# Patient Record
Sex: Female | Born: 1975
Health system: Southern US, Community
[De-identification: ages and names within clinical notes are randomized; demographics above are authoritative.]

## PROBLEM LIST (undated history)

## (undated) DIAGNOSIS — D61818 Other pancytopenia: Secondary | ICD-10-CM

## (undated) DIAGNOSIS — Z9221 Personal history of antineoplastic chemotherapy: Secondary | ICD-10-CM

## (undated) DIAGNOSIS — C538 Malignant neoplasm of overlapping sites of cervix uteri: Secondary | ICD-10-CM

## (undated) DIAGNOSIS — R197 Diarrhea, unspecified: Secondary | ICD-10-CM

## (undated) DIAGNOSIS — G43909 Migraine, unspecified, not intractable, without status migrainosus: Secondary | ICD-10-CM

## (undated) DIAGNOSIS — T451X5A Adverse effect of antineoplastic and immunosuppressive drugs, initial encounter: Secondary | ICD-10-CM

## (undated) DIAGNOSIS — D689 Coagulation defect, unspecified: Secondary | ICD-10-CM

## (undated) DIAGNOSIS — M792 Neuralgia and neuritis, unspecified: Secondary | ICD-10-CM

## (undated) DIAGNOSIS — F418 Other specified anxiety disorders: Secondary | ICD-10-CM

## (undated) DIAGNOSIS — R112 Nausea with vomiting, unspecified: Secondary | ICD-10-CM

## (undated) DIAGNOSIS — D649 Anemia, unspecified: Secondary | ICD-10-CM

## (undated) DIAGNOSIS — R1900 Intra-abdominal and pelvic swelling, mass and lump, unspecified site: Secondary | ICD-10-CM

## (undated) DIAGNOSIS — Z5189 Encounter for other specified aftercare: Secondary | ICD-10-CM

## (undated) DIAGNOSIS — Z923 Personal history of irradiation: Secondary | ICD-10-CM

## (undated) HISTORY — DX: Coagulation defect, unspecified: D68.9

## (undated) HISTORY — DX: Encounter for other specified aftercare: Z51.89

## (undated) HISTORY — PX: WISDOM TOOTH EXTRACTION: SHX21

## (undated) HISTORY — DX: Migraine, unspecified, not intractable, without status migrainosus: G43.909

## (undated) HISTORY — PX: ABDOMINAL HYSTERECTOMY: SHX81

---
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
## 2018-03-20 ENCOUNTER — Ambulatory Visit
Admission: EM | Admit: 2018-03-20 | Discharge: 2018-03-20 | Disposition: A | Discharge status: Home / Self Care | Payer: Managed Care, Other (non HMO) | Attending: Family Medicine | Admitting: Family Medicine

## 2018-03-20 DIAGNOSIS — R6889 Other general symptoms and signs: Secondary | ICD-10-CM

## 2018-03-20 DIAGNOSIS — Z3202 Encounter for pregnancy test, result negative: Secondary | ICD-10-CM

## 2018-03-20 DIAGNOSIS — R8290 Unspecified abnormal findings in urine: Secondary | ICD-10-CM | POA: Diagnosis not present

## 2018-03-20 DIAGNOSIS — Z8269 Family history of other diseases of the musculoskeletal system and connective tissue: Secondary | ICD-10-CM | POA: Diagnosis not present

## 2018-03-20 LAB — POCT URINALYSIS DIP (MANUAL ENTRY)
Bilirubin, UA: NEGATIVE
Glucose, UA: NEGATIVE mg/dL
Ketones, POC UA: NEGATIVE mg/dL
Nitrite, UA: NEGATIVE
PH UA: 7.5 (ref 5.0–8.0)
PROTEIN UA: NEGATIVE mg/dL
Spec Grav, UA: <=1.005 — AB (ref 1.010–1.025)
Urobilinogen, UA: 0.2 E.U./dL

## 2018-03-20 LAB — POCT URINE PREGNANCY: Preg Test, Ur: NEGATIVE

## 2018-03-20 NOTE — ED Triage Notes (Signed)
Pt c/o neck, ear, eye and neck pain since January. States has been on 3 rounds of antibiotics, seen ENT and multiple pcp's with no help.

## 2018-03-20 NOTE — Discharge Instructions (Signed)
This is no evidence of infection.  We will call with the lab results.  I am unsure of the culprit of your symptoms.  Consider seeing a neurologist (if possible since you do not yet have a PCP). I recommend [facility].  Take care  Dr. Lacinda Axon

## 2018-03-20 NOTE — ED Provider Notes (Addendum)
[facility]  CSN: [id number] Arrival date & time: 03/20/18  1135  History   Chief Complaint Chief Complaint  Patient presents with  . Facial Pain   HPI  43 year old female presents with multiple complaints.  Patient reports that she has not felt well since January.  Patient reports intermittent fever, chills, night sweats.  She reports ear pain and pressure.  She also reports neck pain, cervical lymphadenopathy, facial pain and pressure, "brain fog".  Patient states that she feels like she is "misfiring".  Patient states that she is also had some trouble ambulating.  Patient has seen a local urgent care provider on more than one occasion.  She has been prescribed antibiotics 3 times.  She has had 2 courses of amoxicillin and a course of doxycycline.  Patient has now seen ENT as of 2/19.  Per her report, he did not feel that this was ENT related.  He thought that this may be neurological.  Patient states that she continues to feel poorly.  She is concerned about her myriad of symptoms.  Of note, patient also reports pain on the top of her head.  I have no record that she has a documented fever.  She is afebrile here.  No known exacerbating factors.  Patient is very concerned and tearful throughout history.  Also, patient states that she has been having irregular periods.  Patient also states that since her antibiotic courses, she has "jellyfish" in her urine.  PMH, Surgical Hx, Family Hx, Social History reviewed and updated as below.  PMH: No reported significant PMH.  Surgical Hx: Recent wisdom teeth extraction.   OB History   No obstetric history on file.    Family History MS - Mother and Brother  Social History Social History   Tobacco Use  . Smoking status: Never Smoker  . Smokeless tobacco: Never Used  Substance Use Topics  . Alcohol use: Not Currently  . Drug use: Not on file     Allergies   Patient has no known allergies.   Review of Systems Review  of Systems Per HPI  Physical Exam Triage Vital Signs ED Triage Vitals  Enc Vitals Group     BP      Pulse      Resp      Temp      Temp src      SpO2      Weight      Height      Head Circumference      Peak Flow      Pain Score      Pain Loc      Pain Edu?      Excl. in [place]?    Updated Vital Signs BP 119/67 (BP Location: Left Arm)   Pulse 76   Temp 97.8 F (36.6 C) (Oral)   SpO2 98%   Visual Acuity Right Eye Distance:   Left Eye Distance:   Bilateral Distance:    Right Eye Near:   Left Eye Near:    Bilateral Near:     Physical Exam Vitals signs and nursing note reviewed.  Constitutional:      General: She is not in acute distress.    Appearance: Normal appearance.  HENT:     Head: Normocephalic and atraumatic.     Right Ear: Tympanic membrane normal.     Left Ear: Tympanic membrane normal.     Nose: Nose normal.     Mouth/Throat:  Pharynx: Oropharynx is clear. No oropharyngeal exudate or posterior oropharyngeal erythema.  Eyes:     General:        Right eye: No discharge.        Left eye: No discharge.     Conjunctiva/sclera: Conjunctivae normal.  Neck:     Musculoskeletal: Neck supple.  Cardiovascular:     Rate and Rhythm: Normal rate and regular rhythm.  Pulmonary:     Effort: Pulmonary effort is normal.     Breath sounds: Normal breath sounds.  Lymphadenopathy:     Cervical: No cervical adenopathy.  Neurological:     Mental Status: She is alert.  Psychiatric:     Comments: Anxious, perseverative, difficult to follow (tangential).     UC Treatments / Results  Labs (all labs ordered are listed, but only abnormal results are displayed) Labs Reviewed  POCT URINALYSIS DIP (MANUAL ENTRY) - Abnormal; Notable for the following components:      Result Value   Spec Grav, UA <=1.005 (*)    Blood, UA moderate (*)    Leukocytes, UA Trace (*)    All other components within normal limits  POCT URINE PREGNANCY - Normal  COMPREHENSIVE METABOLIC  PANEL  CBC WITH DIFFERENTIAL/PLATELET    EKG None  Radiology No results found.  Procedures Procedures (including critical care time)  Medications Ordered in UC Medications - No data to display  Initial Impression / Assessment and Plan / UC Course  I have reviewed the triage vital signs and the nursing notes.  Pertinent labs & imaging results that were available during my care of the patient were reviewed by me and considered in my medical decision making (see chart for details).    43 year old female presents with multiple somatic complaints.  There is no evidence of infection on exam.  Patient has been seen at urgent care and has recently seen ear nose and throat.  I suspect that this is psychogenic and somatization.  ENT has recommended seeing neurology and I think this would be beneficial as she has a family history of multiple sclerosis.  Laboratory studies obtained today.  Awaiting results.  Final Clinical Impressions(s) / UC Diagnoses   Final diagnoses:  Multiple somatic complaints     Discharge Instructions     This is no evidence of infection.  We will call with the lab results.  I am unsure of the culprit of your symptoms.  Consider seeing a neurologist (if possible since you do not yet have a PCP). I recommend [facility].  Take care  Dr. Lacinda Axon     ED Prescriptions    None     Controlled Substance Prescriptions [place] Controlled Substance Registry consulted? Not Applicable   Coral Spikes, DO 03/20/18 [place], [place], DO 03/20/18 1252

## 2018-03-21 LAB — CBC WITH DIFFERENTIAL/PLATELET
Basophils Absolute: 0.1 10*3/uL (ref 0.0–0.2)
Basos: 1 %
EOS (ABSOLUTE): 0.1 10*3/uL (ref 0.0–0.4)
Eos: 1 %
Hematocrit: 37.1 % (ref 34.0–46.6)
Hemoglobin: 13.2 g/dL (ref 11.1–15.9)
IMMATURE GRANS (ABS): 0 10*3/uL (ref 0.0–0.1)
Immature Granulocytes: 0 %
LYMPHS: 34 %
Lymphocytes Absolute: 2.4 10*3/uL (ref 0.7–3.1)
MCH: 33.2 pg — ABNORMAL HIGH (ref 26.6–33.0)
MCHC: 35.6 g/dL (ref 31.5–35.7)
MCV: 93 fL (ref 79–97)
Monocytes Absolute: 0.6 10*3/uL (ref 0.1–0.9)
Monocytes: 8 %
NEUTROS PCT: 56 %
Neutrophils Absolute: 4 10*3/uL (ref 1.4–7.0)
Platelets: 300 10*3/uL (ref 150–450)
RBC: 3.98 x10E6/uL (ref 3.77–5.28)
RDW: 11.3 % — ABNORMAL LOW (ref 11.7–15.4)
WBC: 7.1 10*3/uL (ref 3.4–10.8)

## 2018-03-21 LAB — COMPREHENSIVE METABOLIC PANEL
ALT: 5 IU/L (ref 0–32)
AST: 11 IU/L (ref 0–40)
Albumin/Globulin Ratio: 2 (ref 1.2–2.2)
Albumin: 4.7 g/dL (ref 3.8–4.8)
Alkaline Phosphatase: 54 IU/L (ref 39–117)
BUN/Creatinine Ratio: 10 (ref 9–23)
BUN: 7 mg/dL (ref 6–24)
Bilirubin Total: 0.4 mg/dL (ref 0.0–1.2)
CO2: 24 mmol/L (ref 20–29)
Calcium: 9.9 mg/dL (ref 8.7–10.2)
Chloride: 101 mmol/L (ref 96–106)
Creatinine, Ser: 0.71 mg/dL (ref 0.57–1.00)
GFR calc Af Amer: 121 mL/min/{1.73_m2} (ref >59)
GFR calc non Af Amer: 105 mL/min/{1.73_m2} (ref >59)
Globulin, Total: 2.3 g/dL (ref 1.5–4.5)
Glucose: 82 mg/dL (ref 65–99)
Potassium: 4.5 mmol/L (ref 3.5–5.2)
Sodium: 140 mmol/L (ref 134–144)
Total Protein: 7 g/dL (ref 6.0–8.5)

## 2018-03-23 ENCOUNTER — Telehealth (HOSPITAL_COMMUNITY): Payer: Self-pay | Admitting: Emergency Medicine

## 2018-03-23 NOTE — Telephone Encounter (Signed)
Normal labs. Attempted to reach patient. No answer at this time. 

## 2018-04-01 ENCOUNTER — Ambulatory Visit: Payer: Self-pay | Admitting: Family Medicine

## 2018-04-01 NOTE — Progress Notes (Signed)
Subjective:    Patient ID: Jordan Waters, female    DOB: [birth year], 43 y.o.   MRN: [id number]  HPI:  Jordan Waters is here to establish as a new pt.  She is a pleasant 43 year old female. PMH: Migraine  She denies any other medical conditions/complaints until Jan 2020- then developed significant pain from head to lower back She has experienced spike in fatigue and inability to practice yoga She reports decline in vision and difficulty ambulating She reports her mother (in her 3s) and her brother (in his 36s) bother has MS She has appt with [facility] tomorrow- great! She has been alternating OTC Acetaminophen and Ibuprofen with minimal pain relief Pain - Head: constant throb 9/10 Cervical Neck: constant electric throb 9/10 Thoracic- Lumbar Back: constant "hot posts throbbing" 9/10 She also reports numbness/tingling in all extremities 02/2018- CMP and CBC both stable She denies first degree family hx of diabetes, ca, any other neurological disorders She is accompanied by husband today She states recent depression sx's r/t to constant pain and "just not feeling like myself" She denies any acute accident/injury prior to onset of sx's She denies travel outside [place] or known exposure to TB  Of Note-  Recent ED Visit 03/20/2018 "43 year old female presents with multiple somatic complaints.  There is no evidence of infection on exam.  Patient has been seen at urgent care and has recently seen ear nose and throat.  I suspect that this is psychogenic and somatization.  ENT has recommended seeing neurology and I think this would be beneficial as she has a family history of multiple sclerosis.  Laboratory studies obtained today.  Awaiting results."   Patient Care Team    Relationship Specialty Notifications Start End  Esaw Grandchild, NP PCP - General Family Medicine  03/20/18     Patient Active Problem List   Diagnosis Date Noted  . Fatigue 04/07/2018  . Healthcare maintenance 04/07/2018   . Neuropathic pain 04/07/2018     History reviewed. No pertinent past medical history.   History reviewed. No pertinent surgical history.   Family History  Problem Relation Age of Onset  . Heart disease Paternal Uncle   . Heart disease Paternal Grandfather      Social History   Substance and Sexual Activity  Drug Use Not on file     Social History   Substance and Sexual Activity  Alcohol Use Not Currently     Social History   Tobacco Use  Smoking Status Former Smoker  . Types: Cigarettes  . Last attempt to quit: 02/15/2018  . Years since quitting: 0.1  Smokeless Tobacco Never Used     Outpatient Encounter Medications as of 04/07/2018  Medication Sig  . acetaminophen-codeine (TYLENOL #3) 300-30 MG tablet Take 1 tablet by mouth at bedtime.  . gabapentin (NEURONTIN) 100 MG capsule Take 1 capsule (100 mg total) by mouth 3 (three) times daily.  Marland Kitchen ibuprofen (ADVIL,MOTRIN) 800 MG tablet Take 1 tablet (800 mg total) by mouth every 8 (eight) hours as needed for up to 30 days for headache.   No facility-administered encounter medications on file as of 04/07/2018.     Allergies: Patient has no known allergies.  Body mass index is 22.09 kg/m.  Blood pressure 124/79, pulse 99, temperature 97.9 F (36.6 C), temperature source Oral, height 5\' 1"  (1.549 m), weight 116 lb 14.4 oz (53 kg), SpO2 97 %.   Review of Systems  Constitutional: Positive for activity change and fatigue. Negative for  appetite change, chills, diaphoresis, fever and unexpected weight change.  Respiratory: Negative for cough, chest tightness, shortness of breath, wheezing and stridor.   Cardiovascular: Negative for chest pain, palpitations and leg swelling.  Gastrointestinal: Negative for abdominal distention, abdominal pain, blood in stool, constipation, diarrhea, rectal pain and vomiting.  Endocrine: Negative for cold intolerance, heat intolerance, polydipsia, polyphagia and polyuria.   Genitourinary: Negative for difficulty urinating and flank pain.  Musculoskeletal: Positive for arthralgias, back pain, gait problem, joint swelling, myalgias and neck pain.  Neurological: Positive for headaches. Negative for dizziness.  Hematological: Does not bruise/bleed easily.  Psychiatric/Behavioral: Positive for dysphoric mood and sleep disturbance. Negative for agitation, behavioral problems, confusion, decreased concentration, hallucinations, self-injury and suicidal ideas. The patient is nervous/anxious. The patient is not hyperactive.        Objective:   Physical Exam Vitals signs and nursing note reviewed.  Constitutional:      General: She is in acute distress.     Appearance: She is ill-appearing. She is not diaphoretic.  HENT:     Head: Normocephalic and atraumatic.  Cardiovascular:     Rate and Rhythm: Normal rate.     Pulses: Normal pulses.     Heart sounds: Normal heart sounds.  Pulmonary:     Effort: Pulmonary effort is normal. No respiratory distress.     Breath sounds: Normal breath sounds. No stridor. No wheezing, rhonchi or rales.  Chest:     Chest wall: No tenderness.  Skin:    Capillary Refill: Capillary refill takes less than 2 seconds.  Neurological:     Mental Status: She is alert and oriented to person, place, and time.  Psychiatric:        Attention and Perception: Attention and perception normal.        Mood and Affect: Affect is tearful.        Speech: Speech normal.        Behavior: Behavior normal.        Thought Content: Thought content normal.        Cognition and Memory: Cognition and memory normal.        Judgment: Judgment normal.       Assessment & Plan:   1. Fatigue, unspecified type   2. Healthcare maintenance   3. Neuropathic pain     Healthcare maintenance Continue to drink plenty of water and follow you plant based diet. Please start Gabapentin 100mg - 1 capsule every 8 hrs. We will call you when TSH level results are  available. Remain as active as possible. Please schedule complete physical in 3 months, fasting labs the week prior.  Fatigue Experiencing significant fatigue  We will call you when TSH level results are available.  Neuropathic pain Please start Gabapentin 100mg - 1 capsule every 8 hrs. Please keep Neurology appt tomorrow Has strong family hx of MS   Pt was in the office today for 50+ minutes, I spent >75% of time in face to face counseling of various medical concerns and in coordination of care FOLLOW-UP:  Return in about 3 months (around 07/08/2018) for Fasting Labs, CPE.

## 2018-04-04 ENCOUNTER — Encounter (HOSPITAL_COMMUNITY): Payer: Self-pay | Admitting: *Deleted

## 2018-04-04 ENCOUNTER — Emergency Department (HOSPITAL_COMMUNITY): Payer: Managed Care, Other (non HMO)

## 2018-04-04 ENCOUNTER — Emergency Department (HOSPITAL_COMMUNITY)
Admission: EM | Admit: 2018-04-04 | Discharge: 2018-04-04 | Disposition: A | Discharge status: Home / Self Care | Payer: Managed Care, Other (non HMO) | Attending: Emergency Medicine | Admitting: Emergency Medicine

## 2018-04-04 ENCOUNTER — Other Ambulatory Visit: Payer: Self-pay

## 2018-04-04 DIAGNOSIS — M542 Cervicalgia: Secondary | ICD-10-CM | POA: Insufficient documentation

## 2018-04-04 DIAGNOSIS — R51 Headache: Secondary | ICD-10-CM | POA: Diagnosis present

## 2018-04-04 DIAGNOSIS — G43809 Other migraine, not intractable, without status migrainosus: Secondary | ICD-10-CM | POA: Insufficient documentation

## 2018-04-04 LAB — I-STAT BETA HCG BLOOD, ED (MC, WL, AP ONLY): I-stat hCG, quantitative: <5 m[IU]/mL (ref <5)

## 2018-04-04 IMAGING — CT CT CERVICAL SPINE WITHOUT CONTRAST
4 of 7 series · 14 of 33 positions shown, 15 images · non-contrast
Comparison: None.

CLINICAL DATA: Neck and right ear pain since [DATE]. She has
taken 3 rounds of antibiotics. Diplopia and photophobia.

EXAM:
CT HEAD WITHOUT CONTRAST
CT CERVICAL SPINE WITHOUT CONTRAST
TECHNIQUE: Multidetector CT imaging of the head and cervical spine was
performed following the standard protocol without intravenous
contrast. Multiplanar CT image reconstructions of the cervical spine
were also generated.

[Series 9: c spine soft · axial · 0.30mm/px · z∈[-277,-171]mm · 4 of 89 slices shown]
[im 18/89  soft-tissue]
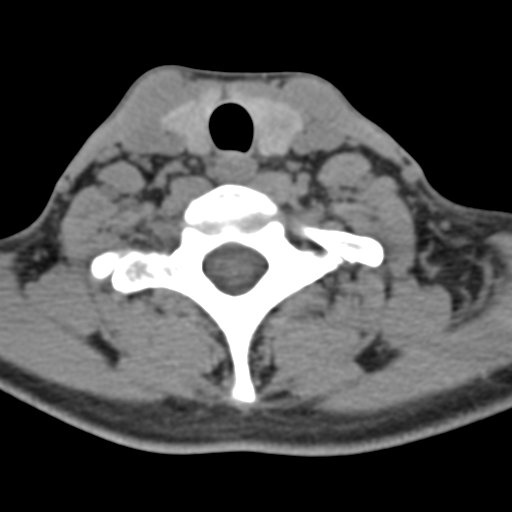
[im 36/89  soft-tissue]
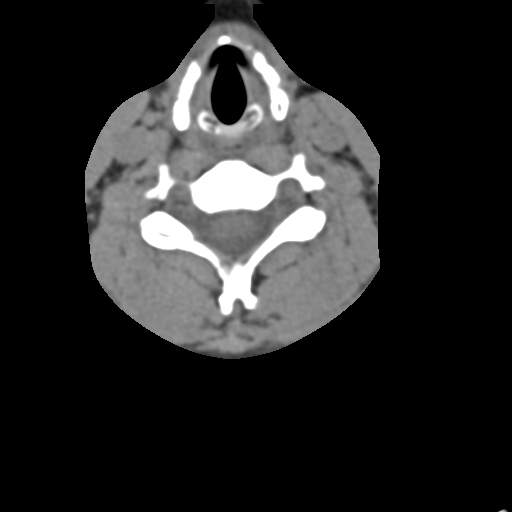
[im 53/89  soft-tissue]
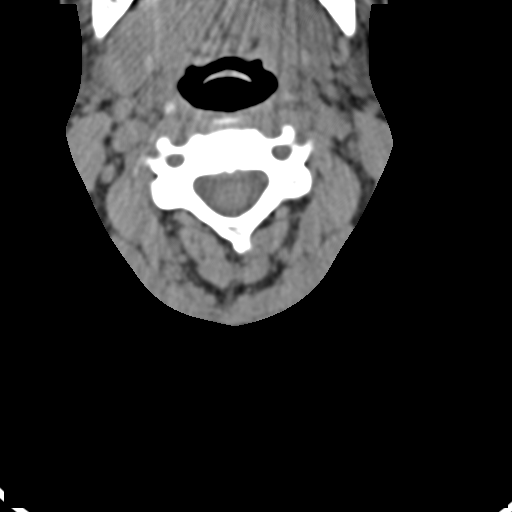
[im 71/89  soft-tissue]
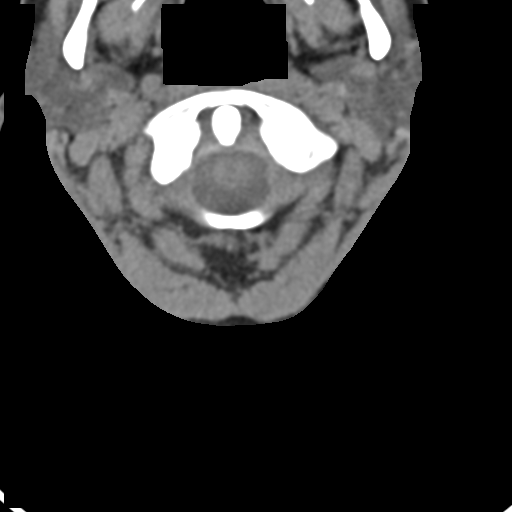

[Series 11: orthogonal bone · axial · 0.23mm/px · z∈[-294,-175]mm · 4 of 101 slices shown, 5 images]
[im 21/101  soft-tissue]
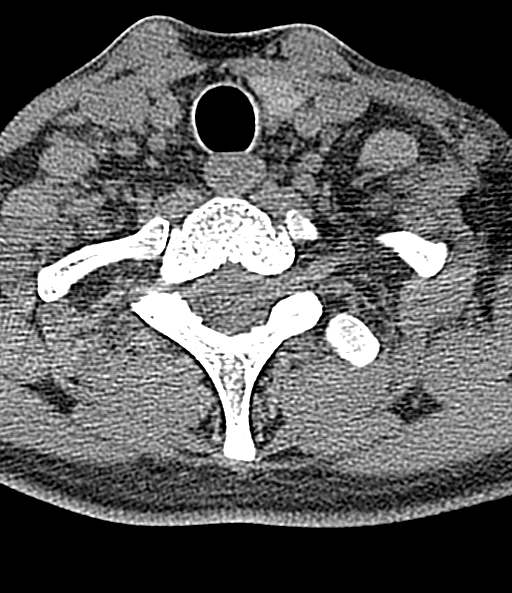
[im 21/101  bone]
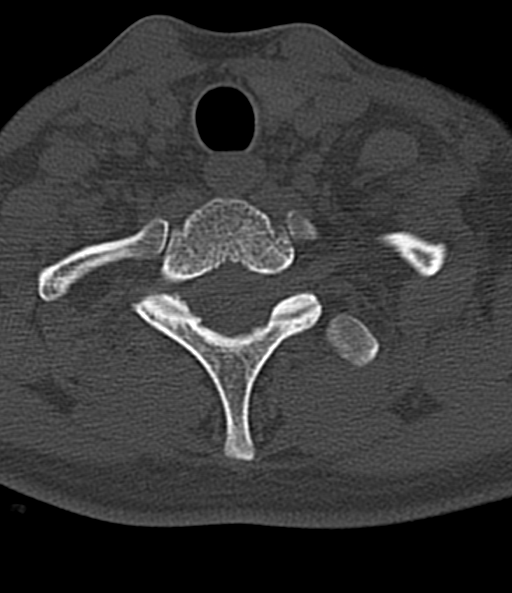
[im 41/101  bone]
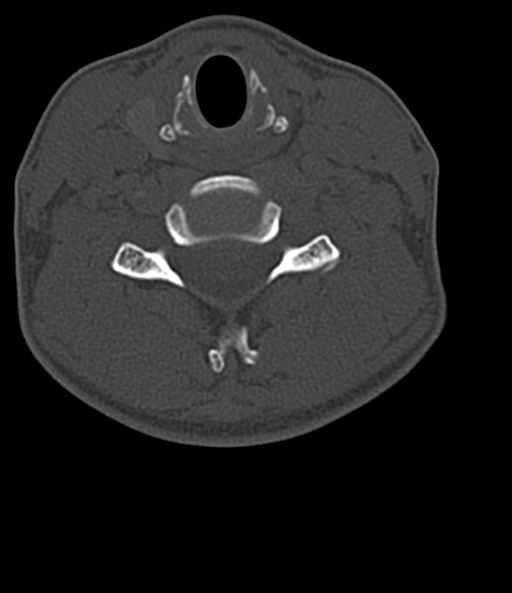
[im 61/101  bone]
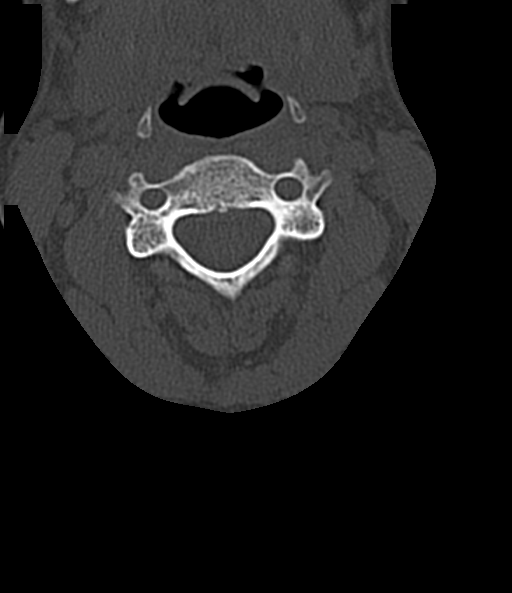
[im 81/101  bone]
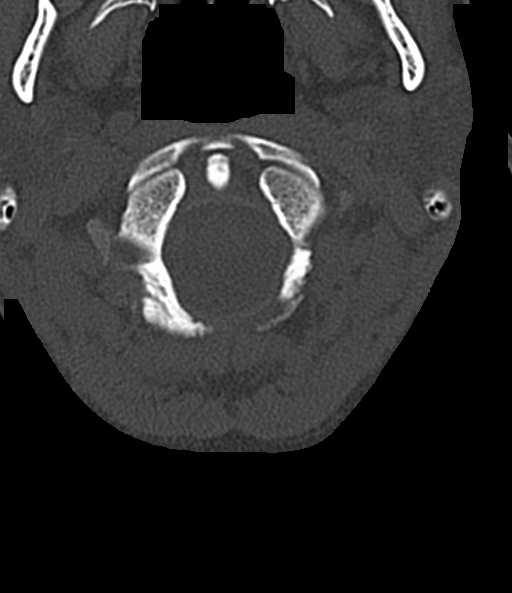

[Series 12: coronal bone · coronal · 0.26mm/px · 1 of 61 slices shown]
[im 31/61  bone]
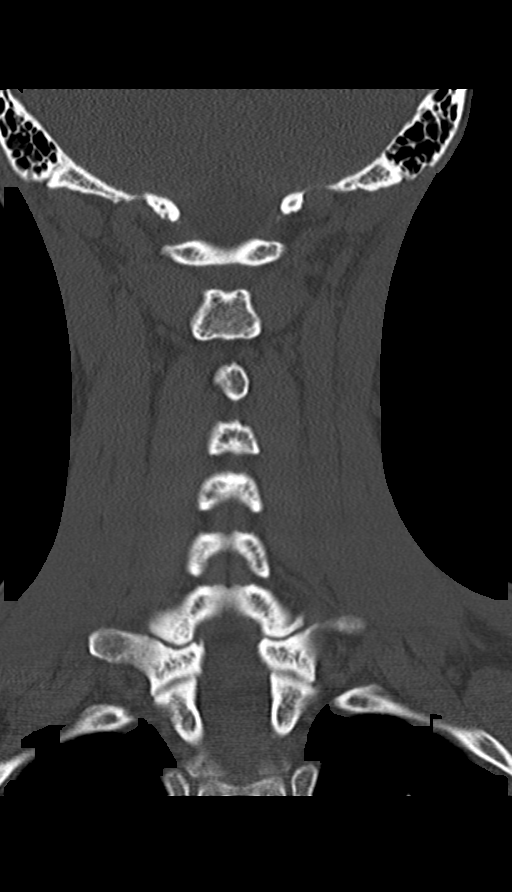

[Series 13: sagittal bone · sagittal · 0.29mm/px · 5 of 61 slices shown]
[im 11/61  bone]
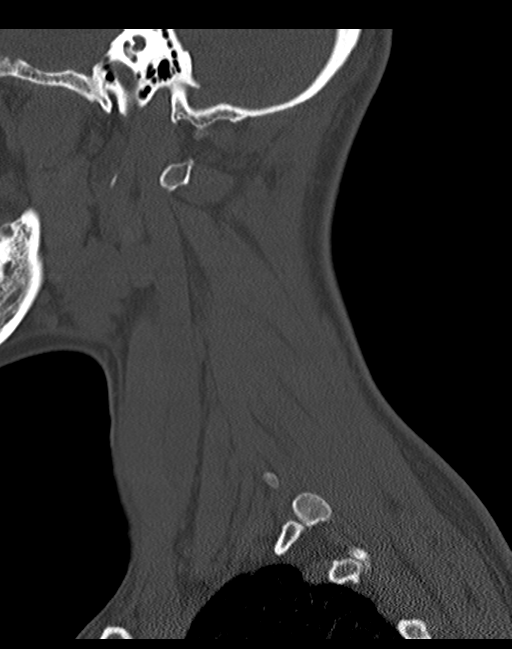
[im 21/61  bone]
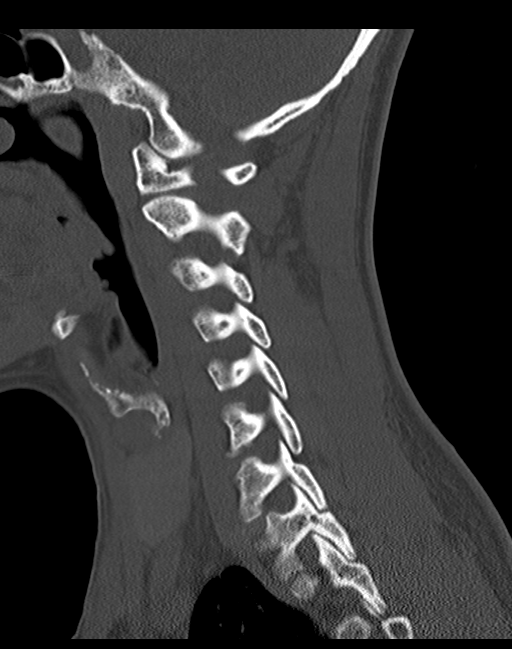
[im 31/61  bone]
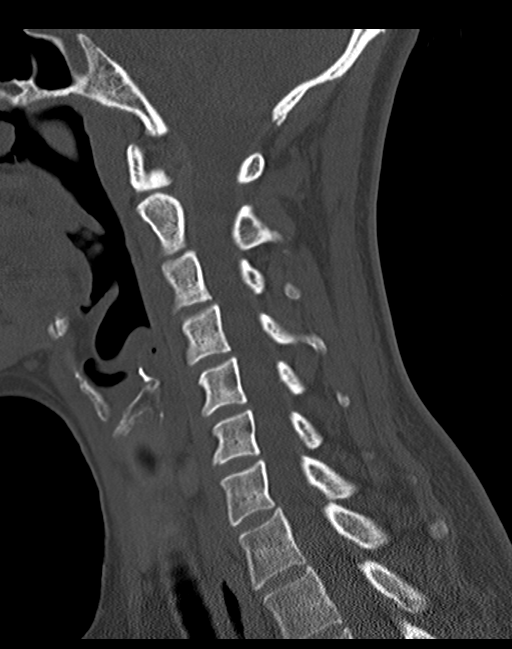
[im 41/61  bone]
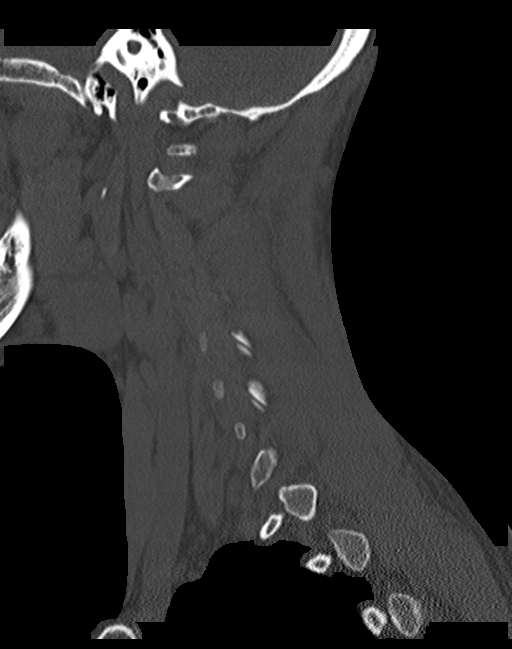
[im 51/61  bone]
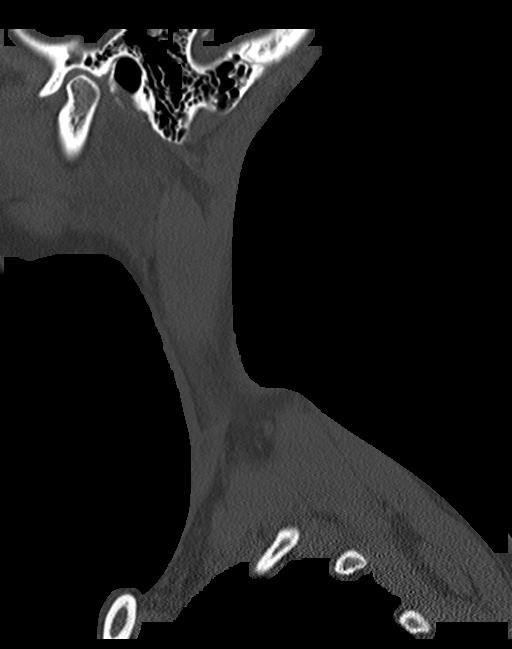

[14 of 33 positions shown; findings below may reference images not displayed]

FINDINGS: CT HEAD FINDINGS

Brain: Normal appearing cerebral hemispheres and posterior fossa
structures. Normal size and position of the ventricles. No evidence
of acute infarction, hemorrhage, hydrocephalus, extra-axial
collection or mass lesion/mass effect.

Vascular: No hyperdense vessel or unexpected calcification.

Skull: Normal. Negative for fracture or focal lesion.

Sinuses/Orbits: Unremarkable.

Other: None.

CT CERVICAL SPINE FINDINGS

Alignment: Minimal reversal of the normal cervical lordosis. No
subluxations.

Skull base and vertebrae: No acute fracture. No primary bone lesion
or focal pathologic process.

Soft tissues and spinal canal: No prevertebral fluid or swelling. No
visible canal hematoma.

Disc levels:  No significant abnormality.

Upper chest: Clear lung apices.

Other: None.
IMPRESSION: 1. Normal noncontrast head CT.
2. Unremarkable noncontrast cervical spine CT.

## 2018-04-04 MED ORDER — PROCHLORPERAZINE MALEATE 10 MG PO TABS
10.0000 mg | ORAL_TABLET | Freq: Once | ORAL | Status: AC
  Administered 2018-04-04: 10 mg via ORAL
  Filled 2018-04-04: qty 1

## 2018-04-04 MED ORDER — DIPHENHYDRAMINE HCL 25 MG PO CAPS
50.0000 mg | ORAL_CAPSULE | Freq: Once | ORAL | Status: AC
  Administered 2018-04-04: 50 mg via ORAL
  Filled 2018-04-04: qty 2

## 2018-04-04 MED ORDER — DEXAMETHASONE 4 MG PO TABS
10.0000 mg | ORAL_TABLET | Freq: Once | ORAL | Status: AC
  Administered 2018-04-04: 10 mg via ORAL
  Filled 2018-04-04: qty 2

## 2018-04-04 MED ORDER — IBUPROFEN 800 MG PO TABS: 800.0000 mg | ORAL_TABLET | Freq: Three times a day (TID) | ORAL | 0 refills | Status: AC | PRN

## 2018-04-04 NOTE — ED Provider Notes (Signed)
[place] DEPT Provider Note   CSN: [id number] Arrival date & time: 04/04/18  1727    History   Chief Complaint Chief Complaint  Patient presents with  . Headache    HPI Jordan Waters is a 43 y.o. female.     The history is provided by the patient.  Migraine  This is a chronic problem. The current episode started more than 1 week ago. The problem occurs daily. Progression since onset: waxing and waning  Associated symptoms include headaches. Pertinent negatives include no chest pain, no abdominal pain and no shortness of breath. Exacerbated by: light, sound. The symptoms are relieved by NSAIDs. She has tried acetaminophen for the symptoms. The treatment provided mild relief.    History reviewed. No pertinent past medical history.  There are no active problems to display for this patient.   History reviewed. No pertinent surgical history.   OB History   No obstetric history on file.      Home Medications    Prior to Admission medications   Medication Sig Start Date End Date Taking? Authorizing Provider  ibuprofen (ADVIL,MOTRIN) 800 MG tablet Take 1 tablet (800 mg total) by mouth every 8 (eight) hours as needed for up to 30 days for headache. 04/04/18 05/04/18  Lennice Sites, DO    Family History No family history on file.  Social History Social History   Tobacco Use  . Smoking status: Never Smoker  . Smokeless tobacco: Never Used  Substance Use Topics  . Alcohol use: Not Currently  . Drug use: Not on file     Allergies   Patient has no known allergies.   Review of Systems Review of Systems  Constitutional: Negative for chills and fever.  HENT: Negative for ear pain and sore throat.   Eyes: Negative for pain and visual disturbance.  Respiratory: Negative for cough and shortness of breath.   Cardiovascular: Negative for chest pain and palpitations.  Gastrointestinal: Negative for abdominal pain and vomiting.   Genitourinary: Negative for dysuria and hematuria.  Musculoskeletal: Positive for neck pain. Negative for arthralgias and back pain.  Skin: Negative for color change and rash.  Neurological: Positive for headaches. Negative for dizziness, tremors, seizures, syncope, facial asymmetry, speech difficulty, weakness, light-headedness and numbness.  All other systems reviewed and are negative.    Physical Exam Updated Vital Signs  ED Triage Vitals  Enc Vitals Group     BP 04/04/18 1750 127/79     Pulse Rate 04/04/18 1750 (!) 106     Resp 04/04/18 1750 18     Temp 04/04/18 1750 98.5 F (36.9 C)     Temp Source 04/04/18 1750 Oral     SpO2 04/04/18 1750 98 %     Weight --      Height --      Head Circumference --      Peak Flow --      Pain Score 04/04/18 1758 7     Pain Loc --      Pain Edu? --      Excl. in [place]? --     Physical Exam Vitals signs and nursing note reviewed.  Constitutional:      General: She is not in acute distress.    Appearance: She is well-developed.  HENT:     Head: Normocephalic and atraumatic.  Eyes:     General: No visual field deficit.    Extraocular Movements: Extraocular movements intact.     Conjunctiva/sclera: Conjunctivae normal.  Pupils: Pupils are equal, round, and reactive to light.  Neck:     Musculoskeletal: Normal range of motion and neck supple. No neck rigidity.     Comments: TTP to paraspinal cervical muscles  Cardiovascular:     Rate and Rhythm: Normal rate and regular rhythm.     Heart sounds: No murmur.  Pulmonary:     Effort: Pulmonary effort is normal. No respiratory distress.     Breath sounds: Normal breath sounds.  Abdominal:     Palpations: Abdomen is soft.     Tenderness: There is no abdominal tenderness.  Skin:    General: Skin is warm and dry.  Neurological:     Mental Status: She is alert and oriented to person, place, and time.     Cranial Nerves: No cranial nerve deficit, dysarthria or facial asymmetry.      Sensory: No sensory deficit.     Motor: No weakness.     Coordination: Coordination normal.     Gait: Gait normal.     Comments: 5+/5 strength, normal sensation, no drift, normal finger to nose finger  Psychiatric:        Mood and Affect: Mood normal.      ED Treatments / Results  Labs (all labs ordered are listed, but only abnormal results are displayed) Labs Reviewed  I-STAT BETA HCG BLOOD, ED ([facility], [facility], [facility] ONLY)    EKG None  Radiology Ct Head Wo Contrast  Result Date: 04/04/2018 CLINICAL DATA:  Neck and right ear pain since January 2020. She has taken 3 rounds of antibiotics. Diplopia and photophobia. EXAM: CT HEAD WITHOUT CONTRAST CT CERVICAL SPINE WITHOUT CONTRAST TECHNIQUE: Multidetector CT imaging of the head and cervical spine was performed following the standard protocol without intravenous contrast. Multiplanar CT image reconstructions of the cervical spine were also generated. COMPARISON:  None. FINDINGS: CT HEAD FINDINGS Brain: Normal appearing cerebral hemispheres and posterior fossa structures. Normal size and position of the ventricles. No evidence of acute infarction, hemorrhage, hydrocephalus, extra-axial collection or mass lesion/mass effect. Vascular: No hyperdense vessel or unexpected calcification. Skull: Normal. Negative for fracture or focal lesion. Sinuses/Orbits: Unremarkable. Other: None. CT CERVICAL SPINE FINDINGS Alignment: Minimal reversal of the normal cervical lordosis. No subluxations. Skull base and vertebrae: No acute fracture. No primary bone lesion or focal pathologic process. Soft tissues and spinal canal: No prevertebral fluid or swelling. No visible canal hematoma. Disc levels:  No significant abnormality. Upper chest: Clear lung apices. Other: None. IMPRESSION: 1. Normal noncontrast head CT. 2. Unremarkable noncontrast cervical spine CT. Electronically Signed   By: Claudie Revering M.D.   On: 04/04/2018 18:56   Ct Cervical Spine Wo Contrast  Result Date:  04/04/2018 CLINICAL DATA:  Neck and right ear pain since January 2020. She has taken 3 rounds of antibiotics. Diplopia and photophobia. EXAM: CT HEAD WITHOUT CONTRAST CT CERVICAL SPINE WITHOUT CONTRAST TECHNIQUE: Multidetector CT imaging of the head and cervical spine was performed following the standard protocol without intravenous contrast. Multiplanar CT image reconstructions of the cervical spine were also generated. COMPARISON:  None. FINDINGS: CT HEAD FINDINGS Brain: Normal appearing cerebral hemispheres and posterior fossa structures. Normal size and position of the ventricles. No evidence of acute infarction, hemorrhage, hydrocephalus, extra-axial collection or mass lesion/mass effect. Vascular: No hyperdense vessel or unexpected calcification. Skull: Normal. Negative for fracture or focal lesion. Sinuses/Orbits: Unremarkable. Other: None. CT CERVICAL SPINE FINDINGS Alignment: Minimal reversal of the normal cervical lordosis. No subluxations. Skull base and vertebrae: No acute fracture.  No primary bone lesion or focal pathologic process. Soft tissues and spinal canal: No prevertebral fluid or swelling. No visible canal hematoma. Disc levels:  No significant abnormality. Upper chest: Clear lung apices. Other: None. IMPRESSION: 1. Normal noncontrast head CT. 2. Unremarkable noncontrast cervical spine CT. Electronically Signed   By: Claudie Revering M.D.   On: 04/04/2018 18:56    Procedures Procedures (including critical care time)  Medications Ordered in ED Medications  prochlorperazine (COMPAZINE) tablet 10 mg (10 mg Oral Given 04/04/18 1845)  diphenhydrAMINE (BENADRYL) capsule 50 mg (50 mg Oral Given 04/04/18 1845)  dexamethasone (DECADRON) tablet 10 mg (10 mg Oral Given 04/04/18 1846)     Initial Impression / Assessment and Plan / ED Course  I have reviewed the triage vital signs and the nursing notes.  Pertinent labs & imaging results that were available during my care of the patient were  reviewed by me and considered in my medical decision making (see chart for details).     Jordan Waters is a 43 year old female history of headaches who presents to the ED with headache, neck pain.  Patient has had symptoms on and off for the last several months.  Has had multiple rounds of antibiotics for sinus infections with ENT.  She has been referred to neurology for her headaches at this point.  Suspect that patient has primary migraine syndrome which she states that she does have in the past.  However, headaches have become more frequent and with worsening symptoms.  She has photophobia.  She denies any fever.  No signs to suggest meningitis.  She does not particularly take Tylenol or Motrin for her headaches.  She states that she has been keeping a headache journal while she awaits neurology referral.  She has normal neurological exam.  Negative pregnancy test.  Overall she likely has some type of migraine syndrome.  Given Compazine, Benadryl, Decadron with great improvement of her symptoms.  She has not had any recent head imaging or neck imaging since she has had this pain for the last several months.  These images were obtained today that showed no acute findings.  Patient likely with primary migraine syndrome, normal neuro exam.  Educated about the use of Tylenol/Motrin.  Given information to follow-up with neurology.  Given return precautions.  This chart was dictated using voice recognition software.  Despite best efforts to proofread,  errors can occur which can change the documentation meaning.    Final Clinical Impressions(s) / ED Diagnoses   Final diagnoses:  Other migraine without status migrainosus, not intractable    ED Discharge Orders         Ordered    ibuprofen (ADVIL,MOTRIN) 800 MG tablet  Every 8 hours PRN     04/04/18 2014           Lennice Sites, DO 04/04/18 2018

## 2018-04-04 NOTE — ED Triage Notes (Signed)
Pt complains of head, neck, right ear, pain since January. Pt has been on 3 rounds of antibiotics, seen dentist and [facility]. Pt states she has double vision and photophobia.

## 2018-04-04 NOTE — Discharge Instructions (Signed)
Take 500 mg of Tylenol as needed for headache every 6-8 hours. No not use medications everyday if possible, could make headaches worse.

## 2018-04-07 ENCOUNTER — Other Ambulatory Visit: Payer: Self-pay

## 2018-04-07 ENCOUNTER — Ambulatory Visit (INDEPENDENT_AMBULATORY_CARE_PROVIDER_SITE_OTHER): Payer: Managed Care, Other (non HMO) | Admitting: Adult Health

## 2018-04-07 ENCOUNTER — Encounter: Payer: Self-pay | Admitting: Adult Health

## 2018-04-07 VITALS — BP 124/79 | HR 99 | Temp 97.9°F | Ht 61.0 in | Wt 116.9 lb

## 2018-04-07 DIAGNOSIS — Z0000 Encounter for general adult medical examination without abnormal findings: Secondary | ICD-10-CM

## 2018-04-07 DIAGNOSIS — M792 Neuralgia and neuritis, unspecified: Secondary | ICD-10-CM | POA: Insufficient documentation

## 2018-04-07 DIAGNOSIS — R5383 Other fatigue: Secondary | ICD-10-CM | POA: Diagnosis not present

## 2018-04-07 MED ORDER — GABAPENTIN 100 MG PO CAPS: 100.0000 mg | ORAL_CAPSULE | Freq: Three times a day (TID) | ORAL | 0 refills | Status: DC

## 2018-04-07 NOTE — Assessment & Plan Note (Signed)
Continue to drink plenty of water and follow you plant based diet. Please start Gabapentin 100mg - 1 capsule every 8 hrs. We will call you when TSH level results are available. Remain as active as possible. Please schedule complete physical in 3 months, fasting labs the week prior.

## 2018-04-07 NOTE — Assessment & Plan Note (Signed)
Please start Gabapentin 100mg - 1 capsule every 8 hrs. Please keep Neurology appt tomorrow Has strong family hx of MS

## 2018-04-07 NOTE — Patient Instructions (Addendum)
Neuropathic Pain Neuropathic pain is pain caused by damage to the nerves that are responsible for certain sensations in your body (sensory nerves). The pain can be caused by:  Damage to the sensory nerves that send signals to your spinal cord and brain (peripheral nervous system).  Damage to the sensory nerves in your brain or spinal cord (central nervous system). Neuropathic pain can make you more sensitive to pain. Even a minor sensation can feel very painful. This is usually a long-term condition that can be difficult to treat. The type of pain differs from person to person. It may:  Start suddenly (acute), or it may develop slowly and last for a long time (chronic).  Come and go as damaged nerves heal, or it may stay at the same level for years.  Cause emotional distress, loss of sleep, and a lower quality of life. What are the causes? The most common cause of this condition is diabetes. Many other diseases and conditions can also cause neuropathic pain. Causes of neuropathic pain can be classified as:  Toxic. This is caused by medicines and chemicals. The most common cause of toxic neuropathic pain is damage from cancer treatments (chemotherapy).  Metabolic. This can be caused by: ? Diabetes. This is the most common disease that damages the nerves. ? Lack of vitamin B from long-term alcohol abuse.  Traumatic. Any injury that cuts, crushes, or stretches a nerve can cause damage and pain. A common example is feeling pain after losing an arm or leg (phantom limb pain).  Compression-related. If a sensory nerve gets trapped or compressed for a long period of time, the blood supply to the nerve can be cut off.  Vascular. Many blood vessel diseases can cause neuropathic pain by decreasing blood supply and oxygen to nerves.  Autoimmune. This type of pain results from diseases in which the body's defense system (immune system) mistakenly attacks sensory nerves. Examples of autoimmune diseases  that can cause neuropathic pain include lupus and multiple sclerosis.  Infectious. Many types of viral infections can damage sensory nerves and cause pain. Shingles infection is a common cause of this type of pain.  Inherited. Neuropathic pain can be a symptom of many diseases that are passed down through families (genetic). What increases the risk? You are more likely to develop this condition if:  You have diabetes.  You smoke.  You drink too much alcohol.  You are taking certain medicines, including medicines that kill cancer cells (chemotherapy) or that treat immune system disorders. What are the signs or symptoms? The main symptom is pain. Neuropathic pain is often described as:  Burning.  Shock-like.  Stinging.  Hot or cold.  Itching. How is this diagnosed? No single test can diagnose neuropathic pain. It is diagnosed based on:  Physical exam and your symptoms. Your health care provider will ask you about your pain. You may be asked to use a pain scale to describe how bad your pain is.  Tests. These may be done to see if you have a high sensitivity to pain and to help find the cause and location of any sensory nerve damage. They include: ? Nerve conduction studies to test how well nerve signals travel through your sensory nerves (electrodiagnostic testing). ? Stimulating your sensory nerves through electrodes on your skin and measuring the response in your spinal cord and brain (somatosensory evoked potential).  Imaging studies, such as: ? X-rays. ? CT scan. ? MRI. How is this treated? Treatment for neuropathic pain may change   over time. You may need to try different treatment options or a combination of treatments. Some options include:  Treating the underlying cause of the neuropathy, such as diabetes, kidney disease, or vitamin deficiencies.  Stopping medicines that can cause neuropathy, such as chemotherapy.  Medicine to relieve pain. Medicines may  include: ? Prescription or over-the-counter pain medicine. ? Anti-seizure medicine. ? Antidepressant medicines. ? Pain-relieving patches that are applied to painful areas of skin. ? A medicine to numb the area (local anesthetic), which can be injected as a nerve block.  Transcutaneous nerve stimulation. This uses electrical currents to block painful nerve signals. The treatment is painless.  Alternative treatments, such as: ? Acupuncture. ? Meditation. ? Massage. ? Physical therapy. ? Pain management programs. ? Counseling. Follow these instructions at home: Medicines   Take over-the-counter and prescription medicines only as told by your health care provider.  Do not drive or use heavy machinery while taking prescription pain medicine.  If you are taking prescription pain medicine, take actions to prevent or treat constipation. Your health care provider may recommend that you: ? Drink enough fluid to keep your urine pale yellow. ? Eat foods that are high in fiber, such as fresh fruits and vegetables, whole grains, and beans. ? Limit foods that are high in fat and processed sugars, such as fried or sweet foods. ? Take an over-the-counter or prescription medicine for constipation. Lifestyle   Have a good support system at home.  Consider joining a chronic pain support group.  Do not use any products that contain nicotine or tobacco, such as cigarettes and e-cigarettes. If you need help quitting, ask your health care provider.  Do not drink alcohol. General instructions  Learn as much as you can about your condition.  Work closely with all your health care providers to find the treatment plan that works best for you.  Ask your health care provider what activities are safe for you.  Keep all follow-up visits as told by your health care provider. This is important. Contact a health care provider if:  Your pain treatments are not working.  You are having side effects  from your medicines.  You are struggling with tiredness (fatigue), mood changes, depression, or anxiety. Summary  Neuropathic pain is pain caused by damage to the nerves that are responsible for certain sensations in your body (sensory nerves).  Neuropathic pain may come and go as damaged nerves heal, or it may stay at the same level for years.  Neuropathic pain is usually a long-term condition that can be difficult to treat. Consider joining a chronic pain support group. This information is not intended to replace advice given to you by your health care provider. Make sure you discuss any questions you have with your health care provider. Document Released: 10/05/2003 Document Revised: 01/24/2017 Document Reviewed: 01/24/2017 Elsevier Interactive Patient Education  2019 [place] (COVID-19) Are you at risk?  Are you at risk for the Coronavirus (COVID-19)?  To be considered HIGH RISK for Coronavirus (COVID-19), you have to meet the following criteria:  . Traveled to [place], [place], [place], [place] or [place]; or in the [place] to [place], [place], [place], or [place]; and have fever, cough, and shortness of breath within the last 2 weeks of travel OR . Been in close contact with a person diagnosed with COVID-19 within the last 2 weeks and have fever, cough, and shortness of breath . IF YOU DO NOT MEET THESE CRITERIA,  YOU ARE CONSIDERED LOW RISK FOR COVID-19.  What to do if you are HIGH RISK for COVID-19?  Marland Kitchen If you are having a medical emergency, call 911. . Seek medical care right away. Before you go to a doctor's office, urgent care or emergency department, call ahead and tell them about your recent travel, contact with someone diagnosed with COVID-19, and your symptoms. You should receive instructions from your physician's office regarding next steps of care.  . When you arrive at healthcare provider, tell the healthcare staff immediately you have  returned from visiting [place], [place], [place], [place] or [place]; or traveled in the [place] to [place], [place], [place], or [place]; in the last two weeks or you have been in close contact with a person diagnosed with COVID-19 in the last 2 weeks.   . Tell the health care staff about your symptoms: fever, cough and shortness of breath. . After you have been seen by a medical provider, you will be either: o Tested for (COVID-19) and discharged home on quarantine except to seek medical care if symptoms worsen, and asked to  - Stay home and avoid contact with others until you get your results (4-5 days)  - Avoid travel on public transportation if possible (such as bus, train, or airplane) or o Sent to the Emergency Department by EMS for evaluation, COVID-19 testing, and possible admission depending on your condition and test results.  What to do if you are LOW RISK for COVID-19?  Reduce your risk of any infection by using the same precautions used for avoiding the common cold or flu:  Marland Kitchen Wash your hands often with soap and warm water for at least 20 seconds.  If soap and water are not readily available, use an alcohol-based hand sanitizer with at least 60% alcohol.  . If coughing or sneezing, cover your mouth and nose by coughing or sneezing into the elbow areas of your shirt or coat, into a tissue or into your sleeve (not your hands). . Avoid shaking hands with others and consider head nods or verbal greetings only. . Avoid touching your eyes, nose, or mouth with unwashed hands.  . Avoid close contact with people who are sick. . Avoid places or events with large numbers of people in one location, like concerts or sporting events. . Carefully consider travel plans you have or are making. . If you are planning any travel outside or inside the [place], visit the CDC's Travelers' Health webpage for the latest health notices. . If you have some symptoms but not all symptoms, continue to  monitor at home and seek medical attention if your symptoms worsen. . If you are having a medical emergency, call 911.   [place] / e-Visit: [email or web address]         [facility]: [phone]  [facility]: [phone]                   [facility]: [phone]   Continue to drink plenty of water and follow you plant based diet. Please start Gabapentin 100mg - 1 capsule every 8 hrs. We will call you when TSH level results are available. Remain as active as possible. Please schedule complete physical in 3 months, fasting labs the week prior. WELCOME TO THE PRACTICE!

## 2018-04-07 NOTE — Assessment & Plan Note (Signed)
Experiencing significant fatigue  We will call you when TSH level results are available.

## 2018-04-08 ENCOUNTER — Encounter: Payer: Self-pay | Admitting: Neurology

## 2018-04-08 ENCOUNTER — Ambulatory Visit (INDEPENDENT_AMBULATORY_CARE_PROVIDER_SITE_OTHER): Payer: Managed Care, Other (non HMO) | Admitting: Neurology

## 2018-04-08 VITALS — BP 105/69 | HR 89 | Ht 61.0 in | Wt 112.5 lb

## 2018-04-08 DIAGNOSIS — IMO0002 Reserved for concepts with insufficient information to code with codable children: Secondary | ICD-10-CM | POA: Insufficient documentation

## 2018-04-08 DIAGNOSIS — R32 Unspecified urinary incontinence: Secondary | ICD-10-CM | POA: Diagnosis not present

## 2018-04-08 DIAGNOSIS — R269 Unspecified abnormalities of gait and mobility: Secondary | ICD-10-CM

## 2018-04-08 DIAGNOSIS — M542 Cervicalgia: Secondary | ICD-10-CM | POA: Diagnosis not present

## 2018-04-08 DIAGNOSIS — G43709 Chronic migraine without aura, not intractable, without status migrainosus: Secondary | ICD-10-CM

## 2018-04-08 DIAGNOSIS — F418 Other specified anxiety disorders: Secondary | ICD-10-CM | POA: Insufficient documentation

## 2018-04-08 DIAGNOSIS — H53143 Visual discomfort, bilateral: Secondary | ICD-10-CM

## 2018-04-08 LAB — TSH: TSH: 2.23 u[IU]/mL (ref 0.450–4.500)

## 2018-04-08 MED ORDER — DULOXETINE HCL 60 MG PO CPEP: 60.0000 mg | ORAL_CAPSULE | Freq: Every day | ORAL | 3 refills | Status: DC

## 2018-04-08 NOTE — Progress Notes (Signed)
[facility]  PATIENT: Jordan Waters DOB: 1975/06/11  REFERRING DOCTOR OR PCP:  Mina Marble, NP SOURCE: patient, notes from ED (04/04/2018) and PCP, imaging reports, CT images personally reviewed.  _________________________________   HISTORICAL  CHIEF COMPLAINT:  Chief Complaint  Patient presents with  . Headache    rm 12, New Pt, ED referral, husband- Leane Para, "pain, headaches, blurred vision, immobility issues, extremith control issues, overall "septic" full body feeling"     HISTORY OF PRESENT ILLNESS:  I had the pleasure seeing a patient, [name], at [facility] for neurologic consultation regarding her headaches.  She is a 43 year old woman who has had a daily headache since January.    She has a tight sensation in her head and neck. The neck pain radiates down the spine.  The pain is symmetric.   She has had nausea daily but no vomiting.   She notes photophobia and phonophobia.    Moving her head does not greatly alter the intensity.     She notes pain going into all 4 limbs and she feels all are clumsy.   She has been dropping items.   Her speech is slurred at times.    She denies bladder issues but had incontinence in the office.  She has seen primary care/urgent care and was diagnosed with sinusitis.  She was prescribed amoxicillin and told to take acetaminophen initially.    A dentist noted cracked wisdom teeth and these were pulled without benefit.    Ibuprofen was also prescribed without benefit.   She also saw ENT who saw no evidence of sinusitis.   She went back to urgent care and was prescribed prednisone and Flexeril without benefit.    She notes diplopia and night sweats 04/03/2018.   She presented to the emergency room 04/04/2018 due to the persistant headache.  She was prescribed NSAIDs.    Yesterday, gabapentin was prescribed.      She has a long history of migraines but they have never persisted.   Triptans gave her  nightmares so she has not taken recently.      She reports depression and anxiety, worse since symptoms started.    CT scan of the head and cervical spine was performed and was read as normal.  I personally reviewed the images.  The CT scan of the head was normal.  The CT scan of the cervical spine showed no significant degenerative changes.  REVIEW OF SYSTEMS: Constitutional: No fevers, chills,  or change in appetite.  She reports night sweats. Eyes: No visual changes, double vision, eye pain Ear, nose and throat: No hearing loss, ear pain, nasal congestion, sore throat Cardiovascular: No chest pain, palpitations Respiratory: No shortness of breath at rest or with exertion.   No wheezes GastrointestinaI: No nausea, vomiting, diarrhea, abdominal pain, fecal incontinence Genitourinary: No dysuria, urinary retention or frequency.  No nocturia.  Although she does not have a history of incontinence, she did have some doing the examination today. Musculoskeletal:See above. Integumentary: No rash, pruritus, skin lesions Neurological: as above Psychiatric: She has depression and anxiety. Endocrine: No palpitations, diaphoresis, change in appetite, change in weigh or increased thirst Hematologic/Lymphatic: No anemia, purpura, petechiae. Allergic/Immunologic: No itchy/runny eyes, nasal congestion, recent allergic reactions, rashes  ALLERGIES: No Known Allergies  HOME MEDICATIONS:  Current Outpatient Medications:  .  acetaminophen (TYLENOL) 650 MG CR tablet, Take 650 mg by mouth every 8 (eight) hours as needed for pain., Disp: , Rfl:  .  gabapentin (  NEURONTIN) 100 MG capsule, Take 1 capsule (100 mg total) by mouth 3 (three) times daily., Disp: 90 capsule, Rfl: 0 .  ibuprofen (ADVIL,MOTRIN) 800 MG tablet, Take 1 tablet (800 mg total) by mouth every 8 (eight) hours as needed for up to 30 days for headache., Disp: 21 tablet, Rfl: 0 .  acetaminophen-codeine (TYLENOL #3) 300-30 MG tablet, Take 1  tablet by mouth at bedtime., Disp: , Rfl:  .  DULoxetine (CYMBALTA) 60 MG capsule, Take 1 capsule (60 mg total) by mouth daily., Disp: 30 capsule, Rfl: 3  PAST MEDICAL HISTORY: Past Medical History:  Diagnosis Date  . Migraine     PAST SURGICAL HISTORY: History reviewed. No pertinent surgical history.  FAMILY HISTORY: Family History  Problem Relation Age of Onset  . Heart disease Paternal Uncle   . Heart disease Paternal Grandfather   . Multiple sclerosis Mother   . Heart disease Father   . Multiple sclerosis Sister     SOCIAL HISTORY:  Social History   Socioeconomic History  . Marital status: Married    Spouse name: Leane Para  . Number of children: 3  . Years of education: 56  . Highest education level: Not on file  Occupational History  . Not on file  Social Needs  . Financial resource strain: Not on file  . Food insecurity:    Worry: Not on file    Inability: Not on file  . Transportation needs:    Medical: Not on file    Non-medical: Not on file  Tobacco Use  . Smoking status: Former Smoker    Types: Cigarettes    Last attempt to quit: 02/15/2018    Years since quitting: 0.1  . Smokeless tobacco: Never Used  Substance and Sexual Activity  . Alcohol use: Not Currently  . Drug use: Never  . Sexual activity: Yes  Lifestyle  . Physical activity:    Days per week: Not on file    Minutes per session: Not on file  . Stress: Not on file  Relationships  . Social connections:    Talks on phone: Not on file    Gets together: Not on file    Attends religious service: Not on file    Active member of club or organization: Not on file    Attends meetings of clubs or organizations: Not on file    Relationship status: Not on file  . Intimate partner violence:    Fear of current or ex partner: Not on file    Emotionally abused: Not on file    Physically abused: Not on file    Forced sexual activity: Not on file  Other Topics Concern  . Not on file  Social History  Narrative   Lives with husband, children   Caffeine ,limited     PHYSICAL EXAM  Vitals:   04/08/18 1458  BP: 105/69  Pulse: 89  Weight: 112 lb 8 oz (51 kg)  Height: 5' 1" (1.549 m)    Body mass index is 21.26 kg/m.   General: The patient is well-developed and well-nourished and in no acute distress  Eyes:  Funduscopic exam shows normal optic discs and retinal vessels.  Neck: The neck is supple, no carotid bruits are noted.  The neck is tender in the paraspinal muscles more than the splenius capitis.    Cardiovascular: The heart has a regular rate and rhythm with a normal S1 and S2. There were no murmurs, gallops or rubs. Lungs are clear to auscultation.  Skin: Extremities are without rash or edema.  Musculoskeletal: She has general muscle tenderness and tenderness in the lower back.  Neurologic Exam  Mental status: All movements and speech are slowed.  The patient is alert and oriented x 3 at the time of the examination. The patient has apparent normal recent and remote memory, with an apparently normal attention span and concentration ability.   Speech content seems normal though she did slur her words at times.  Cranial nerves: Extraocular movements are full. Pupils are equal, round, and reactive to light and accomodation.  Facial symmetry is present. There is good facial sensation to soft touch bilaterally.Facial strength is normal.  Trapezius and sternocleidomastoid strength is normal. She slurs speech intermittently.  The tongue is midline, and the patient has symmetric elevation of the soft palate. No obvious hearing deficits are noted.  Motor:  Muscle bulk is normal.   Tone is normal. Strength is  5 / 5 in all 4 extremities though effort seemed variable.   Sensory: Sensory testing appears intact to touch.   Coordination: Cerebellar testing reveals mildly off finger-nose-finger and heel-to-shin bilaterally.  Gait and station: Station is normal.   Gait is wide based  and she does not tandem walk. Romberg is borderline.   Reflexes: Deep tendon reflexes are symmetric and normal bilaterally.   Plantar responses are flexor.    DIAGNOSTIC DATA (LABS, IMAGING, TESTING) - I reviewed patient records, labs, notes, testing and imaging myself where available.  Lab Results  Component Value Date   WBC 7.1 03/20/2018   HGB 13.2 03/20/2018   HCT 37.1 03/20/2018   MCV 93 03/20/2018   PLT 300 03/20/2018      Component Value Date/Time   NA 140 03/20/2018 1237   K 4.5 03/20/2018 1237   CL 101 03/20/2018 1237   CO2 24 03/20/2018 1237   GLUCOSE 82 03/20/2018 1237   BUN 7 03/20/2018 1237   CREATININE 0.71 03/20/2018 1237   CALCIUM 9.9 03/20/2018 1237   PROT 7.0 03/20/2018 1237   ALBUMIN 4.7 03/20/2018 1237   AST 11 03/20/2018 1237   ALT 5 03/20/2018 1237   ALKPHOS 54 03/20/2018 1237   BILITOT 0.4 03/20/2018 1237   GFRNONAA 105 03/20/2018 1237   GFRAA 121 03/20/2018 1237    Lab Results  Component Value Date   TSH 2.230 04/07/2018       ASSESSMENT AND PLAN   Chronic migraine - Plan: Cryptococcal antigen, CSF, VDRL, CSF, Protein, CSF, Cell Count, CSF, Oligoclonal bands, CSF + serm, Glucose, CSF, MTB NAA Without AFB Culture, Immunoglobulin G Index, CBC with Differential/Platelet, Sedimentation rate, CSF culture, C-reactive protein, Cryptococcus Antigen, CSF, Body fluid culture, Body fluid culture, CANCELED: Cryptococcal antigen, CSF  Neck pain - Plan: Cryptococcal antigen, CSF, VDRL, CSF, Protein, CSF, Cell Count, CSF, Oligoclonal bands, CSF + serm, Glucose, CSF, MTB NAA Without AFB Culture, Immunoglobulin G Index, CBC with Differential/Platelet, Sedimentation rate, CSF culture, C-reactive protein, Cryptococcus Antigen, CSF, Body fluid culture, Body fluid culture, CANCELED: Cryptococcal antigen, CSF  Gait disturbance - Plan: Cryptococcal antigen, CSF, VDRL, CSF, Protein, CSF, Cell Count, CSF, Oligoclonal bands, CSF + serm, Glucose, CSF, MTB NAA Without  AFB Culture, Immunoglobulin G Index, CSF culture, Body fluid culture, Body fluid culture  Urinary incontinence, unspecified type - Plan: MR CERVICAL SPINE W WO CONTRAST  Depression with anxiety  Photophobia of both eyes   In summary, Ms. Frick is a 43 year old woman with multiple neurologic and somatic complaints including headache, neck pain, photophobia.  During the examination she had incontinence.  Due to the neck stiffness, report of night sweats, headaches and severe photophobia, I proceeded to do a lumbar puncture to rule out meningitis and other processes.  Fluid will be sent for cell count, protein, glucose, cryptococcus antigen, and TB stains.  Additionally we will check blood work for ESR and CRP though temporal arteritis would be unlikely at her age.  She has a family history of MS and we will also check oligoclonal bands as we are getting spinal fluid.  Although the CT scan of the head and cervical spine appeared unremarkable, we need to check an MRI of the cervical spine to make sure that there is not an intrinsic spinal cord process that could cause some of her symptoms but would not have been apparent on CT.  Based on the results of the studies, further evaluation or referral will be done.  If the evaluation is normal, consider an anti-CGRP monoclonal antibody for her chronic headaches with migrainous features  Additionally, she has anxiety and depression and became tearful on a couple occasions.  There was some functional component to the examination.  Some or most of the symptoms could be due to a psychogenic etiology.  Cymbalta was started for depression and anxiety.  If the entire evaluation is negative, consider referral to psychiatry for further evaluation and treatment.  Further follow-up will be arranged based on her results.  Thank you for the referral to see Mrs. Capek.  Please let me know if I can be of further assistance with her or other patients in the future.  Rinaldo Macqueen  A. Felecia Shelling, MD, PhD, FAAN Certified in Neurology, Clinical Neurophysiology, Sleep Medicine, Pain Medicine and Neuroimaging Director, [facility] [address] [place], [place] [address] [phone]

## 2018-04-09 ENCOUNTER — Telehealth: Payer: Self-pay | Admitting: Neurology

## 2018-04-09 LAB — CELL COUNT, CSF
Nuc cell # CSF: 0 cells/uL (ref 0–5)
RBC, CSF: 0 /uL

## 2018-04-09 LAB — SPECIMEN STATUS REPORT

## 2018-04-09 NOTE — Telephone Encounter (Signed)
[employer] order sent to GI. They will obtain the auth and reach out to the pt to schedule.  °

## 2018-04-10 ENCOUNTER — Telehealth: Payer: Self-pay | Admitting: *Deleted

## 2018-04-10 NOTE — Telephone Encounter (Signed)
I called and spoke with pt about results per Dr. Felecia Shelling note. She verbalized understanding.

## 2018-04-10 NOTE — Telephone Encounter (Signed)
-----   Message from Britt Bottom, MD sent at 04/10/2018 11:53 AM EDT ----- Please let the patient know that the lab work so far is normal.   No signs of meningitis.   Some labs are still pending and we will let her know when they come back.

## 2018-04-13 ENCOUNTER — Telehealth: Payer: Self-pay | Admitting: *Deleted

## 2018-04-13 NOTE — Telephone Encounter (Signed)
Called, LVM for pt to call about results.  

## 2018-04-13 NOTE — Telephone Encounter (Signed)
-----   Message from Britt Bottom, MD sent at 04/13/2018  5:07 PM EDT ----- Please let her know that the spinal fluid looked normal.  She did not have either of the 2 abnormalities seen in MS.  There was no evidence of an  infection.

## 2018-04-14 LAB — BODY FLUID CULTURE

## 2018-04-14 NOTE — Telephone Encounter (Signed)
I called pt back and relayed results. She is aware MRI order sent to GI. She will contact them about getting scheduled.

## 2018-04-14 NOTE — Telephone Encounter (Signed)
Pt returned call. Please call back as soon as available.  °

## 2018-04-15 LAB — CBC WITH DIFFERENTIAL/PLATELET
BASOS ABS: 0 10*3/uL (ref 0.0–0.2)
Basos: 0 %
EOS (ABSOLUTE): 0.1 10*3/uL (ref 0.0–0.4)
Eos: 1 %
Hematocrit: 37.1 % (ref 34.0–46.6)
Hemoglobin: 12.5 g/dL (ref 11.1–15.9)
IMMATURE GRANULOCYTES: 0 %
Immature Grans (Abs): 0 10*3/uL (ref 0.0–0.1)
LYMPHS: 19 %
Lymphocytes Absolute: 1.8 10*3/uL (ref 0.7–3.1)
MCH: 32.2 pg (ref 26.6–33.0)
MCHC: 33.7 g/dL (ref 31.5–35.7)
MCV: 96 fL (ref 79–97)
MONOCYTES: 7 %
Monocytes Absolute: 0.6 10*3/uL (ref 0.1–0.9)
Neutrophils Absolute: 6.9 10*3/uL (ref 1.4–7.0)
Neutrophils: 73 %
Platelets: 352 10*3/uL (ref 150–450)
RBC: 3.88 x10E6/uL (ref 3.77–5.28)
RDW: 11.3 % — ABNORMAL LOW (ref 11.7–15.4)
WBC: 9.5 10*3/uL (ref 3.4–10.8)

## 2018-04-15 LAB — IGG CSF INDEX
Albumin CSF-mCnc: 21 mg/dL (ref 11–48)
Albumin: 3.8 g/dL (ref 3.8–4.8)
IGG/ALBUMIN RATIO, CSF: 0.1 (ref 0.00–0.25)
IgG (Immunoglobin G), Serum: 829 mg/dL (ref 700–1600)
IgG Index, CSF: 0.5 (ref 0.0–0.7)
IgG, CSF: 2.2 mg/dL (ref 0.0–8.6)

## 2018-04-15 LAB — GLUCOSE, CSF: GLUCOSE CSF: 59 mg/dL (ref 40–70)

## 2018-04-15 LAB — MTB NAA WITHOUT AFB CULTURE: M tuberculosis, NAA: NEGATIVE

## 2018-04-15 LAB — VDRL, CSF: VDRL Quant, CSF: NONREACTIVE

## 2018-04-15 LAB — PROTEIN, CSF: Protein, CSF: 40 mg/dL (ref 0.0–44.0)

## 2018-04-15 LAB — C-REACTIVE PROTEIN: CRP: <1 mg/L (ref 0–10)

## 2018-04-15 LAB — SEDIMENTATION RATE: SED RATE: 2 mm/h (ref 0–32)

## 2018-04-15 LAB — OLIGOCLONAL BANDS, CSF + SERM

## 2018-04-15 LAB — CRYPTOCOCCUS ANTIGEN, CSF: MPI.26.183017: NEGATIVE

## 2018-04-17 LAB — BODY FLUID CULTURE

## 2018-04-17 LAB — SPECIMEN STATUS REPORT

## 2018-04-28 NOTE — Telephone Encounter (Signed)
Christella Scheuermann called stating that the patient is scheduled at [facility] for Thursday 04/30/18. Ph # [phone] & fax # [phone].

## 2018-04-29 NOTE — Telephone Encounter (Signed)
Novella Rob: [id number] (exp. 04/28/18 to 10/25/18)

## 2018-05-04 ENCOUNTER — Other Ambulatory Visit: Payer: Managed Care, Other (non HMO)

## 2018-05-07 ENCOUNTER — Telehealth: Payer: Self-pay | Admitting: *Deleted

## 2018-05-07 ENCOUNTER — Telehealth: Payer: Self-pay | Admitting: Adult Health

## 2018-05-07 ENCOUNTER — Other Ambulatory Visit: Payer: Self-pay | Admitting: Adult Health

## 2018-05-07 MED ORDER — GABAPENTIN 100 MG PO CAPS: 100.0000 mg | ORAL_CAPSULE | Freq: Three times a day (TID) | ORAL | 0 refills | Status: DC

## 2018-05-07 NOTE — Telephone Encounter (Signed)
I am unsure if pt is to continue this medication.  Please review and refill if appropriate.  Charyl Bigger, CMA

## 2018-05-07 NOTE — Telephone Encounter (Signed)
I called and spoke with husband. Relayed per Dr. Felecia Shelling that MRI cervical showed very mild age related disc changes but nothing that would cause her symptoms. Husband said she is still in pain but doing better. No longer having suicidal thoughts. I offered per Dr. Felecia Shelling notes to make referral to psychiatry for further evaluation but husband declined. He will let wife know about results and have her call with any further questions or concerns.

## 2018-05-07 NOTE — Telephone Encounter (Signed)
Patient called to request Rx refill on :   gabapentin (NEURONTIN) 100 MG capsule [[id number]]   Order Details  Dose: 100 mg Route: Oral Frequency: 3 times daily  Indications of Use: Neuropathic Pain  Dispense Quantity: 90 capsule Refills: 0 Fills remaining: --        Sig: Take 1 capsule (100 mg total) by mouth 3 (three) times daily.          ----Forwarding request to medical assistant that if approved send refill order to :   [id number] [place], [place]. [phone] (Phone) [phone] (Fax)   --Dion Body

## 2018-05-29 ENCOUNTER — Other Ambulatory Visit: Payer: Self-pay | Admitting: Adult Health

## 2018-06-09 ENCOUNTER — Telehealth: Payer: Self-pay | Admitting: Adult Health

## 2018-06-09 MED ORDER — GABAPENTIN 100 MG PO CAPS: 100.0000 mg | ORAL_CAPSULE | Freq: Three times a day (TID) | ORAL | 0 refills | Status: DC

## 2018-06-09 NOTE — Addendum Note (Signed)
Addended by: Fonnie Mu on: 06/09/2018 01:06 PM   Modules accepted: Orders

## 2018-06-09 NOTE — Telephone Encounter (Signed)
Patient is requesting a refill of her gabapentin, if approved [facility] [address]

## 2018-07-25 ENCOUNTER — Other Ambulatory Visit: Payer: Self-pay

## 2018-07-25 ENCOUNTER — Observation Stay (HOSPITAL_COMMUNITY): Payer: Managed Care, Other (non HMO)

## 2018-07-25 ENCOUNTER — Encounter (HOSPITAL_COMMUNITY): Payer: Self-pay | Admitting: *Deleted

## 2018-07-25 ENCOUNTER — Inpatient Hospital Stay (HOSPITAL_COMMUNITY)
Admission: EM | Admit: 2018-07-25 | Discharge: 2018-07-28 | DRG: 755 | Disposition: A | Discharge status: Home / Self Care | Payer: Managed Care, Other (non HMO) | Attending: Obstetrics & Gynecology | Admitting: Obstetrics & Gynecology

## 2018-07-25 DIAGNOSIS — D62 Acute posthemorrhagic anemia: Secondary | ICD-10-CM | POA: Diagnosis present

## 2018-07-25 DIAGNOSIS — N888 Other specified noninflammatory disorders of cervix uteri: Secondary | ICD-10-CM | POA: Diagnosis present

## 2018-07-25 DIAGNOSIS — Z1159 Encounter for screening for other viral diseases: Secondary | ICD-10-CM

## 2018-07-25 DIAGNOSIS — C539 Malignant neoplasm of cervix uteri, unspecified: Secondary | ICD-10-CM | POA: Diagnosis not present

## 2018-07-25 DIAGNOSIS — D649 Anemia, unspecified: Secondary | ICD-10-CM | POA: Diagnosis not present

## 2018-07-25 DIAGNOSIS — N939 Abnormal uterine and vaginal bleeding, unspecified: Secondary | ICD-10-CM | POA: Diagnosis not present

## 2018-07-25 DIAGNOSIS — G43909 Migraine, unspecified, not intractable, without status migrainosus: Secondary | ICD-10-CM | POA: Diagnosis present

## 2018-07-25 DIAGNOSIS — R55 Syncope and collapse: Secondary | ICD-10-CM | POA: Diagnosis not present

## 2018-07-25 DIAGNOSIS — Z820 Family history of epilepsy and other diseases of the nervous system: Secondary | ICD-10-CM

## 2018-07-25 DIAGNOSIS — Z87891 Personal history of nicotine dependence: Secondary | ICD-10-CM

## 2018-07-25 DIAGNOSIS — Z8249 Family history of ischemic heart disease and other diseases of the circulatory system: Secondary | ICD-10-CM

## 2018-07-25 DIAGNOSIS — F418 Other specified anxiety disorders: Secondary | ICD-10-CM | POA: Diagnosis present

## 2018-07-25 HISTORY — DX: Other specified anxiety disorders: F41.8

## 2018-07-25 LAB — CBC WITH DIFFERENTIAL/PLATELET
Abs Immature Granulocytes: 0.01 10*3/uL (ref 0.00–0.07)
Basophils Absolute: 0 10*3/uL (ref 0.0–0.1)
Basophils Relative: 0 %
Eosinophils Absolute: 0.1 10*3/uL (ref 0.0–0.5)
Eosinophils Relative: 1 %
HCT: 23.6 % — ABNORMAL LOW (ref 36.0–46.0)
Hemoglobin: 7.7 g/dL — ABNORMAL LOW (ref 12.0–15.0)
Immature Granulocytes: 0 %
Lymphocytes Relative: 25 %
Lymphs Abs: 1.7 10*3/uL (ref 0.7–4.0)
MCH: 30.4 pg (ref 26.0–34.0)
MCHC: 32.6 g/dL (ref 30.0–36.0)
MCV: 93.3 fL (ref 80.0–100.0)
Monocytes Absolute: 0.6 10*3/uL (ref 0.1–1.0)
Monocytes Relative: 10 %
Neutro Abs: 4.2 10*3/uL (ref 1.7–7.7)
Neutrophils Relative %: 64 %
Platelets: 288 10*3/uL (ref 150–400)
RBC: 2.53 MIL/uL — ABNORMAL LOW (ref 3.87–5.11)
RDW: 12.2 % (ref 11.5–15.5)
WBC: 6.6 10*3/uL (ref 4.0–10.5)
nRBC: 0 % (ref 0.0–0.2)

## 2018-07-25 LAB — BASIC METABOLIC PANEL
Anion gap: 8 (ref 5–15)
BUN: <5 mg/dL — ABNORMAL LOW (ref 6–20)
CO2: 24 mmol/L (ref 22–32)
Calcium: 8.2 mg/dL — ABNORMAL LOW (ref 8.9–10.3)
Chloride: 104 mmol/L (ref 98–111)
Creatinine, Ser: 0.75 mg/dL (ref 0.44–1.00)
GFR calc Af Amer: >60 mL/min (ref >60)
GFR calc non Af Amer: >60 mL/min (ref >60)
Glucose, Bld: 103 mg/dL — ABNORMAL HIGH (ref 70–99)
Potassium: 3.6 mmol/L (ref 3.5–5.1)
Sodium: 136 mmol/L (ref 135–145)

## 2018-07-25 LAB — WET PREP, GENITAL
Clue Cells Wet Prep HPF POC: NONE SEEN
Sperm: NONE SEEN
Trich, Wet Prep: NONE SEEN
Yeast Wet Prep HPF POC: NONE SEEN

## 2018-07-25 LAB — RAPID URINE DRUG SCREEN, HOSP PERFORMED
Amphetamines: NOT DETECTED
Barbiturates: NOT DETECTED
Benzodiazepines: NOT DETECTED
Cocaine: NOT DETECTED
Opiates: NOT DETECTED
Tetrahydrocannabinol: NOT DETECTED

## 2018-07-25 LAB — SARS CORONAVIRUS 2 BY RT PCR (HOSPITAL ORDER, PERFORMED IN ~~LOC~~ HOSPITAL LAB): SARS Coronavirus 2: NEGATIVE

## 2018-07-25 LAB — URINALYSIS, ROUTINE W REFLEX MICROSCOPIC
Bacteria, UA: NONE SEEN
Bilirubin Urine: NEGATIVE
Glucose, UA: NEGATIVE mg/dL
Ketones, ur: NEGATIVE mg/dL
Leukocytes,Ua: NEGATIVE
Nitrite: NEGATIVE
Protein, ur: 100 mg/dL — AB
RBC / HPF: >50 RBC/hpf — ABNORMAL HIGH (ref 0–5)
Specific Gravity, Urine: 1.008 (ref 1.005–1.030)
WBC, UA: >50 WBC/hpf — ABNORMAL HIGH (ref 0–5)
pH: 8 (ref 5.0–8.0)

## 2018-07-25 LAB — ABO/RH: ABO/RH(D): B POS

## 2018-07-25 LAB — I-STAT BETA HCG BLOOD, ED (MC, WL, AP ONLY): I-stat hCG, quantitative: <5 m[IU]/mL (ref <5)

## 2018-07-25 LAB — PREPARE RBC (CROSSMATCH)

## 2018-07-25 LAB — TSH: TSH: 1.465 u[IU]/mL (ref 0.350–4.500)

## 2018-07-25 IMAGING — US US PELVIS COMPLETE WITH TRANSVAGINAL
1 series · 13 of 25 positions shown · non-contrast
Comparison: None

CLINICAL DATA: Abnormal uterine bleeding for a month.

EXAM:
TRANSABDOMINAL AND TRANSVAGINAL ULTRASOUND OF PELVIS
TECHNIQUE: Both transabdominal and transvaginal ultrasound examinations of the
pelvis were performed. Transabdominal technique was performed for
global imaging of the pelvis including uterus, ovaries, adnexal
regions, and pelvic cul-de-sac. It was necessary to proceed with
endovaginal exam following the transabdominal exam to visualize the
endometrium and ovaries.

[Series 1: us pelvis complete with transvaginal · 108 acquisitions, 13 frames shown]
[im 1/108]
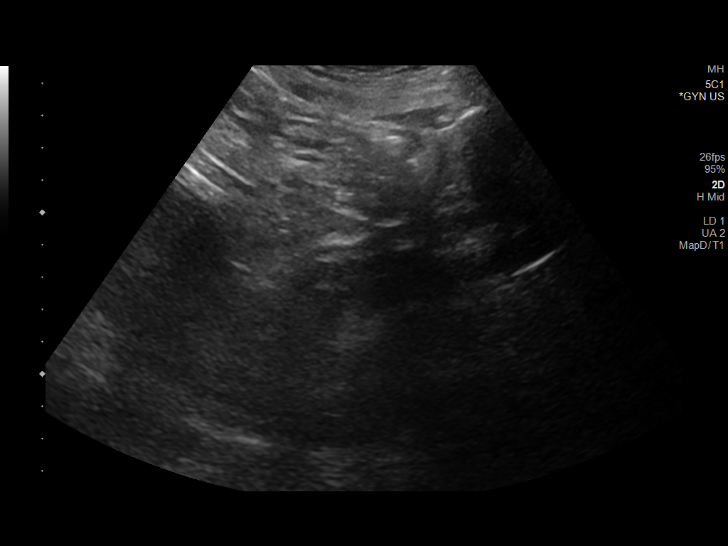
[im 9/108]
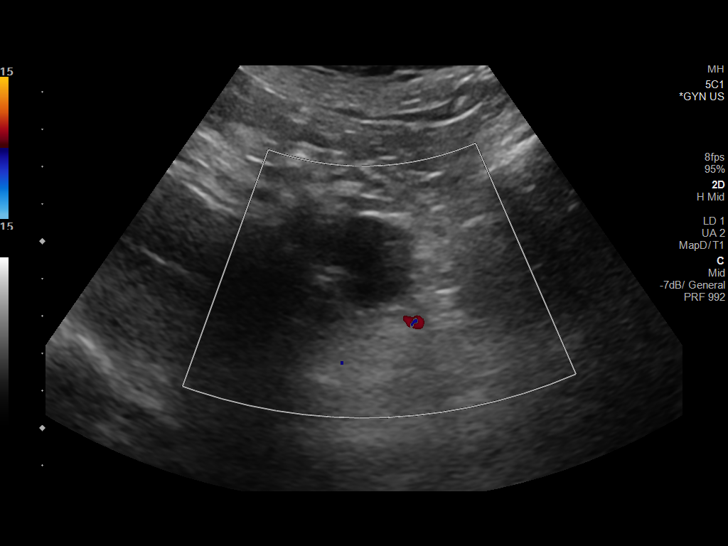
[im 18/108]
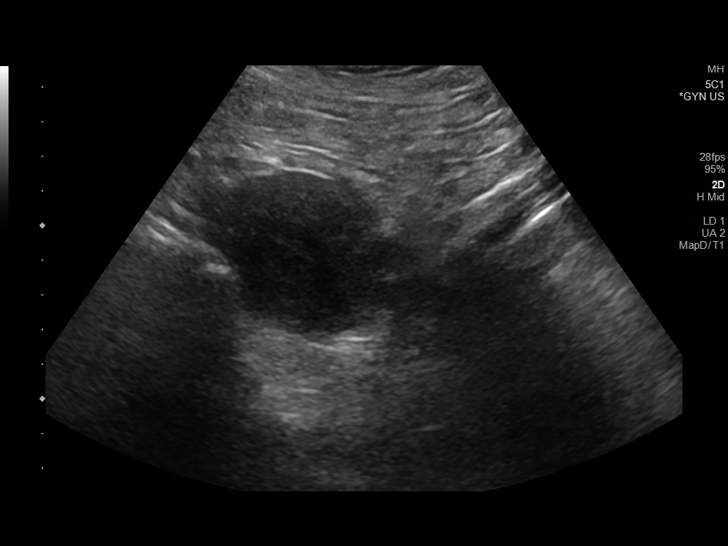
[im 27/108]
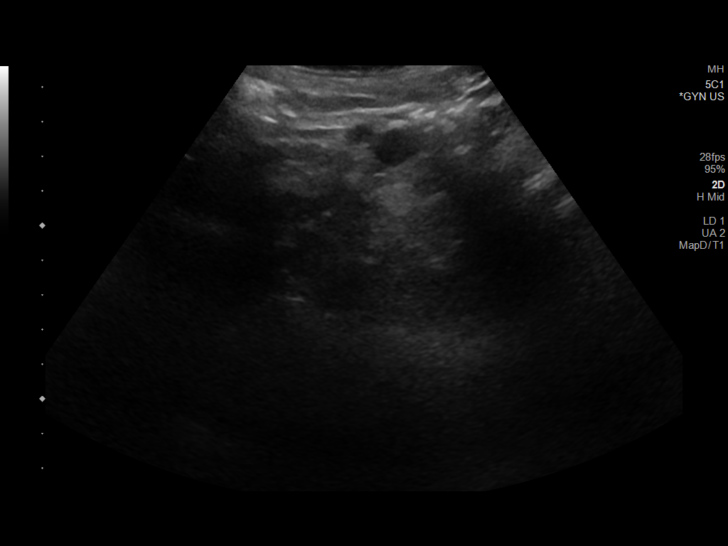
[im 36/108]
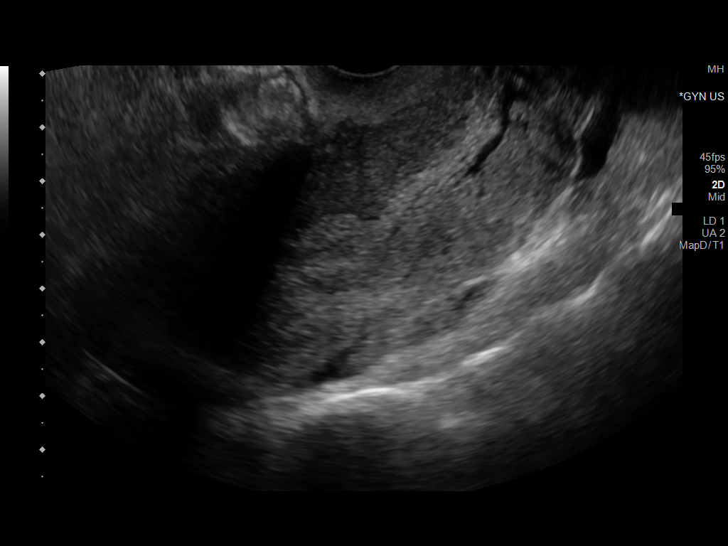
[im 45/108]
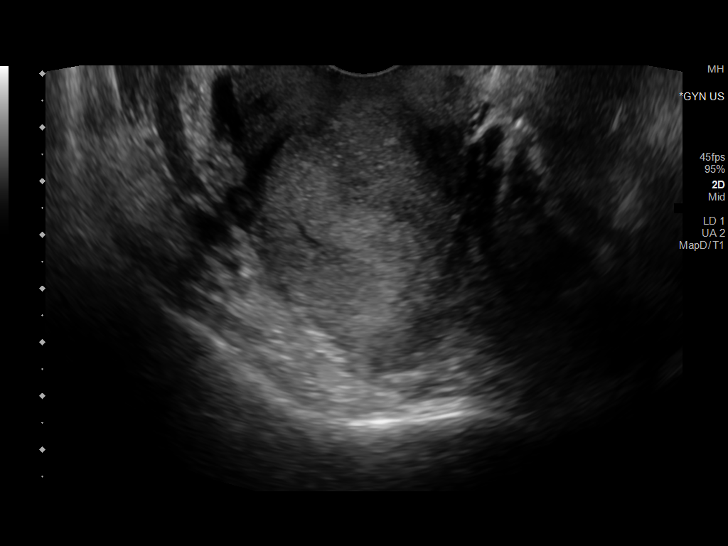
[im 54/108]
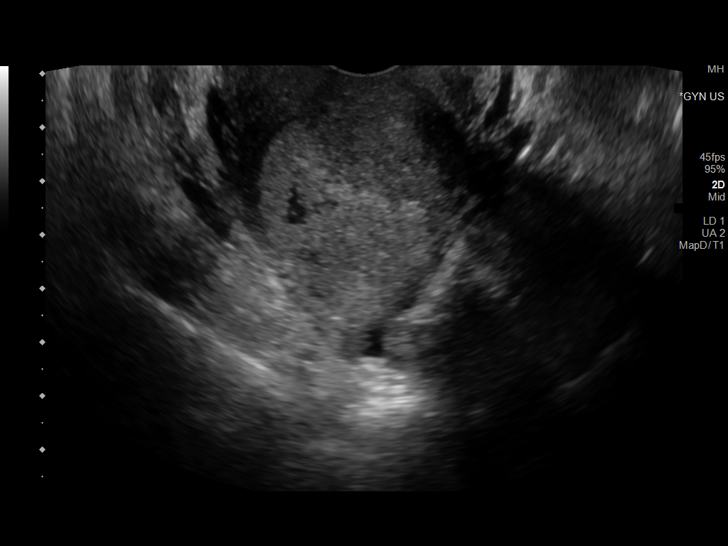
[im 63/108]
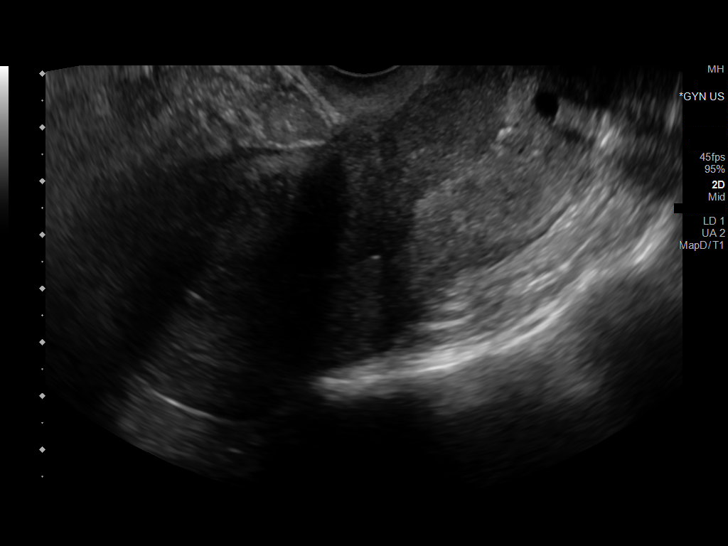
[im 72/108]
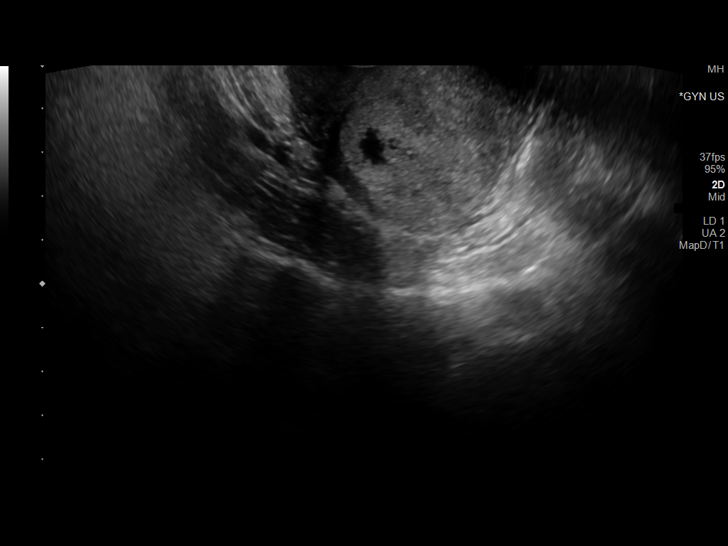
[im 81/108]
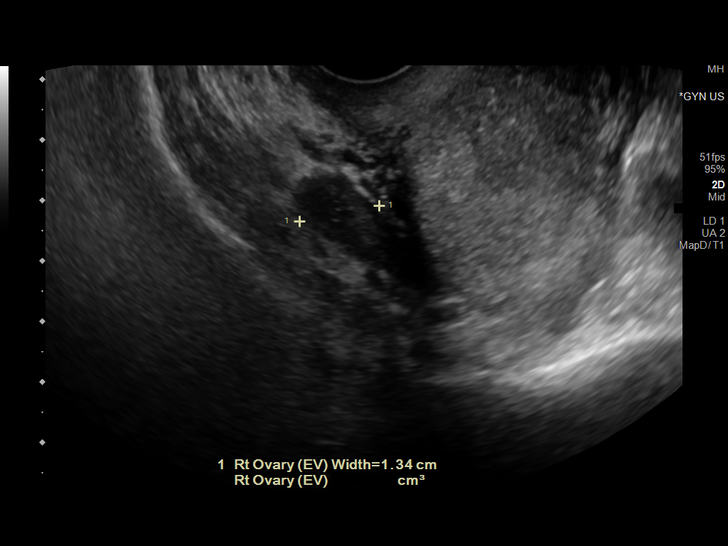
[im 90/108]
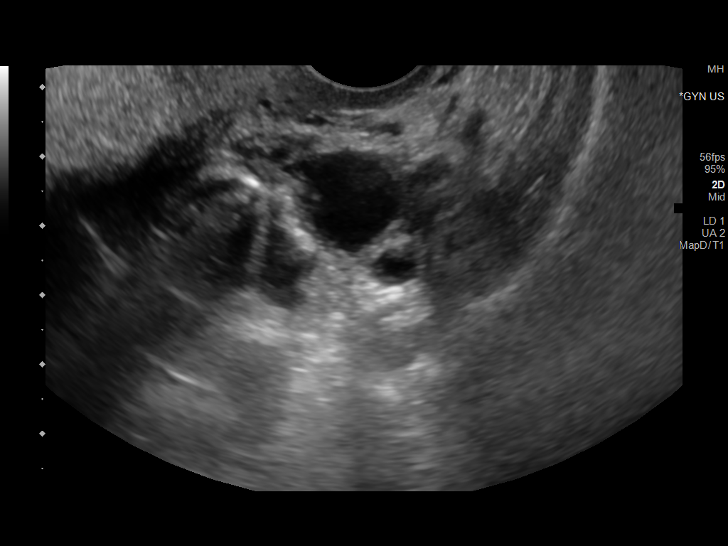
[im 99/108]
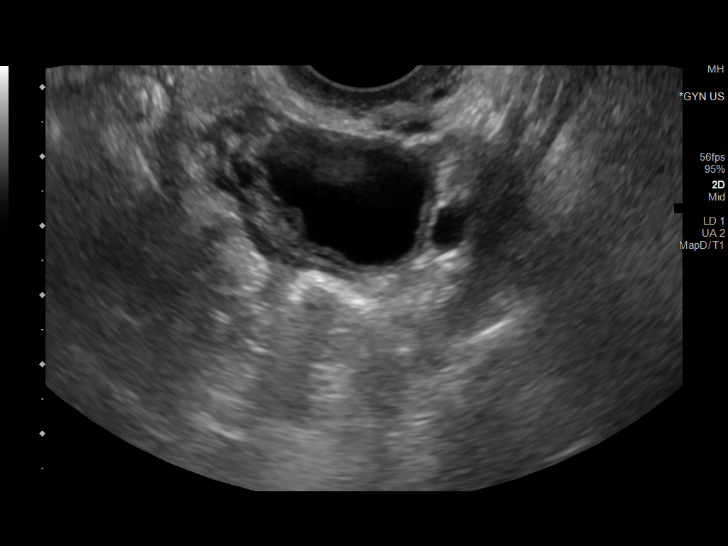
[im 108/108]
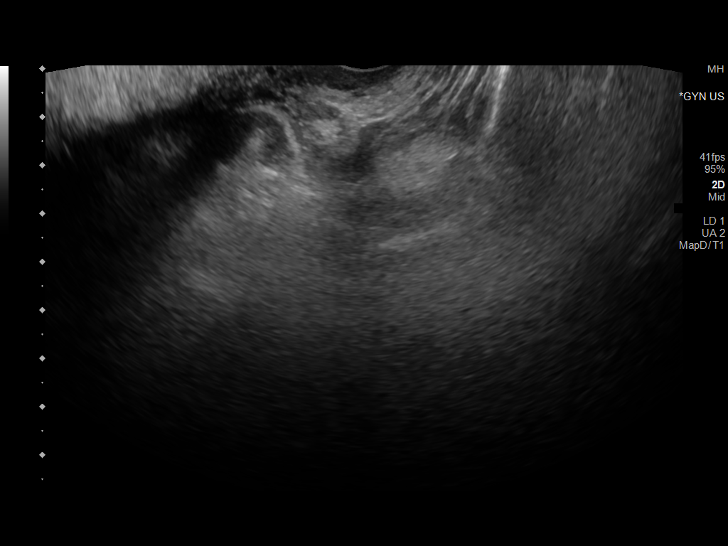

[13 of 25 positions shown; findings below may reference images not displayed]

FINDINGS: Uterus

Measurements: 9.4 x 4.2 x 5.7 cm = volume: 118.4 mL. No fibroids or
other mass visualized.

Endometrium

The endometrium measures 3.8 mm in thickness at the fundus. However,
there is a masslike appearance to the endometrium in the uterine
body and lower uterine segment measuring 3.2 by 7.9 x 2.8 cm with
internal blood flow. There is also fluid in the endometrial canal,
likely blood given history.

Right ovary

Measurements: 2.5 x 1.2 x 1.3 cm = volume: 2 mL. Normal
appearance/no adnexal mass.

Left ovary

Measurements: 3.5 x 2 x 3.1 cm = volume: 11.2 mL. Contains a 2.6 cm
follicle.

Other findings

There is a small amount of free fluid in the pelvis, likely
physiologic.
IMPRESSION: 1. The endometrium has a masslike appearance in the uterine body and
lower uterine segment measuring 3.2 x 7.9 x 2.8 cm. There is also
fluid in the endometrial canal, likely blood products given history.
The constellation of findings are concerning for an endometrial
neoplasm. Recommend gynecologic consultation with endometrial
biopsy.
2. No other abnormalities.

## 2018-07-25 MED ORDER — ONDANSETRON HCL 4 MG PO TABS: 4.0000 mg | ORAL_TABLET | Freq: Four times a day (QID) | ORAL | Status: DC | PRN

## 2018-07-25 MED ORDER — TRAMADOL HCL 50 MG PO TABS
50.0000 mg | ORAL_TABLET | Freq: Four times a day (QID) | ORAL | Status: DC | PRN
  Administered 2018-07-27: 100 mg via ORAL
  Filled 2018-07-25: qty 2

## 2018-07-25 MED ORDER — ZOLPIDEM TARTRATE 5 MG PO TABS: 5.0000 mg | ORAL_TABLET | Freq: Every evening | ORAL | Status: DC | PRN

## 2018-07-25 MED ORDER — DIPHENHYDRAMINE HCL 25 MG PO CAPS
25.0000 mg | ORAL_CAPSULE | Freq: Four times a day (QID) | ORAL | Status: DC | PRN
  Administered 2018-07-25: 25 mg via ORAL
  Filled 2018-07-25: qty 1

## 2018-07-25 MED ORDER — ONDANSETRON HCL 4 MG/2ML IJ SOLN: 4.0000 mg | Freq: Four times a day (QID) | INTRAMUSCULAR | Status: DC | PRN

## 2018-07-25 MED ORDER — MAGNESIUM CITRATE PO SOLN: 1.0000 | Freq: Once | ORAL | Status: DC | PRN

## 2018-07-25 MED ORDER — MEGESTROL ACETATE 40 MG PO TABS
80.0000 mg | ORAL_TABLET | Freq: Two times a day (BID) | ORAL | Status: DC
  Administered 2018-07-25 – 2018-07-28 (×7): 80 mg via ORAL
  Filled 2018-07-25 (×7): qty 2

## 2018-07-25 MED ORDER — BISACODYL 5 MG PO TBEC: 5.0000 mg | DELAYED_RELEASE_TABLET | Freq: Every day | ORAL | Status: DC | PRN

## 2018-07-25 MED ORDER — MEGESTROL ACETATE 40 MG PO TABS
80.0000 mg | ORAL_TABLET | Freq: Once | ORAL | Status: DC
  Filled 2018-07-25: qty 2

## 2018-07-25 MED ORDER — SODIUM CHLORIDE 0.9 % IV SOLN: INTRAVENOUS | Status: DC

## 2018-07-25 MED ORDER — NAPROXEN 250 MG PO TABS
500.0000 mg | ORAL_TABLET | Freq: Two times a day (BID) | ORAL | Status: DC
  Administered 2018-07-25 – 2018-07-28 (×6): 500 mg via ORAL
  Filled 2018-07-25 (×7): qty 2

## 2018-07-25 MED ORDER — HYDROCODONE-ACETAMINOPHEN 5-325 MG PO TABS: 1.0000 | ORAL_TABLET | ORAL | Status: DC | PRN

## 2018-07-25 MED ORDER — IBUPROFEN 600 MG PO TABS: 600.0000 mg | ORAL_TABLET | Freq: Four times a day (QID) | ORAL | Status: DC | PRN

## 2018-07-25 MED ORDER — ACETAMINOPHEN 500 MG PO TABS
1000.0000 mg | ORAL_TABLET | Freq: Once | ORAL | Status: AC
  Administered 2018-07-25: 1000 mg via ORAL
  Filled 2018-07-25: qty 2

## 2018-07-25 MED ORDER — DOCUSATE SODIUM 100 MG PO CAPS
100.0000 mg | ORAL_CAPSULE | Freq: Two times a day (BID) | ORAL | Status: DC
  Administered 2018-07-25 – 2018-07-28 (×6): 100 mg via ORAL
  Filled 2018-07-25 (×7): qty 1

## 2018-07-25 MED ORDER — MAGNESIUM HYDROXIDE 400 MG/5ML PO SUSP: 30.0000 mL | Freq: Every day | ORAL | Status: DC | PRN

## 2018-07-25 MED ORDER — DULOXETINE HCL 60 MG PO CPEP
60.0000 mg | ORAL_CAPSULE | Freq: Every day | ORAL | Status: DC
  Administered 2018-07-25 – 2018-07-28 (×4): 60 mg via ORAL
  Filled 2018-07-25 (×4): qty 1

## 2018-07-25 MED ORDER — ACETAMINOPHEN 500 MG PO TABS: 1000.0000 mg | ORAL_TABLET | Freq: Four times a day (QID) | ORAL | Status: DC | PRN

## 2018-07-25 MED ORDER — SODIUM CHLORIDE 0.9 % IV SOLN: 10.0000 mL/h | Freq: Once | INTRAVENOUS | Status: DC

## 2018-07-25 MED ORDER — TRANEXAMIC ACID 650 MG PO TABS
1300.0000 mg | ORAL_TABLET | Freq: Three times a day (TID) | ORAL | Status: DC
  Administered 2018-07-25 – 2018-07-28 (×9): 1300 mg via ORAL
  Filled 2018-07-25 (×10): qty 2

## 2018-07-25 MED ORDER — DIPHENHYDRAMINE HCL 25 MG PO CAPS
25.0000 mg | ORAL_CAPSULE | Freq: Once | ORAL | Status: AC
  Administered 2018-07-25: 25 mg via ORAL
  Filled 2018-07-25: qty 1

## 2018-07-25 MED ORDER — ESTROGENS CONJUGATED 25 MG IJ SOLR
25.0000 mg | Freq: Once | INTRAMUSCULAR | Status: DC
  Filled 2018-07-25: qty 25

## 2018-07-25 MED ORDER — GABAPENTIN 100 MG PO CAPS
100.0000 mg | ORAL_CAPSULE | Freq: Three times a day (TID) | ORAL | Status: DC
  Administered 2018-07-25 – 2018-07-28 (×9): 100 mg via ORAL
  Filled 2018-07-25 (×10): qty 1

## 2018-07-25 MED ORDER — HYDROMORPHONE HCL 1 MG/ML IJ SOLN: 1.0000 mg | INTRAMUSCULAR | Status: DC | PRN

## 2018-07-25 MED ORDER — ALUM & MAG HYDROXIDE-SIMETH 200-200-20 MG/5ML PO SUSP: 30.0000 mL | ORAL | Status: DC | PRN

## 2018-07-25 NOTE — H&P (Addendum)
[facility] Obstetrics and Gynecology Attending History and Physical  Jordan Waters is a 43 y.o. [id number] who presented to [facility] ER today with 3 week history of heavy vaginal bleeding.  Reported going through a super tampon and pad every hour; bleeding is associated with cramping, lightheadedness and dizziness. She had a couple of syncopal episodes. Pain is somewhat alleviated by Naproxen at home. Normal preceding periods. No history of fibroids, STIs or other GYN issues. Reports that other women in her family go through similar bleeding irregularities around this age and it is thought to be due to perimenopause. Denies any abnormal vaginal discharge, fevers, chills, sweats, dysuria, nausea, vomiting, other GI or GU symptoms or other general symptoms.  Past Medical History:  Diagnosis Date  . Depression with anxiety   . Migraine   . Photophobia of both eyes 04/08/2018   History reviewed. No pertinent surgical history.  OB History  Gravida Para Term Preterm AB Living  5 3 3   2 3   SAB TAB Ectopic Multiple Live Births  1 1     3     # Outcome Date GA Lbr Len/2nd Weight Sex Delivery Anes PTL Lv  5 TAB           4 SAB           3 Term      Vag-Spont   LIV  2 Term      Vag-Spont   LIV  1 Term      Vag-Spont   LIV  Patient denies any other pertinent gynecologic issues. Has normal paps, last one was in [place] about 2-3 years ago. She has not established care with any gynecologist since moving to the area.  No current facility-administered medications on file prior to encounter.    Current Outpatient Medications on File Prior to Encounter  Medication Sig Dispense Refill  . acetaminophen (TYLENOL) 650 MG CR tablet Take 650 mg by mouth every 8 (eight) hours as needed for pain.    Marland Kitchen acetaminophen-codeine (TYLENOL #3) 300-30 MG tablet Take 1 tablet by mouth at bedtime.    . DULoxetine (CYMBALTA) 60 MG capsule Take 1 capsule (60 mg total) by mouth daily. 30 capsule 3  . gabapentin (NEURONTIN)  100 MG capsule Take 1 capsule (100 mg total) by mouth 3 (three) times daily. 270 capsule 0   No Known Allergies  Social History:   reports that she quit smoking about 5 months ago. Her smoking use included cigarettes. She has never used smokeless tobacco. She reports previous alcohol use. She reports that she does not use drugs. Family History  Problem Relation Age of Onset  . Heart disease Paternal Uncle   . Heart disease Paternal Grandfather   . Multiple sclerosis Mother   . Heart disease Father   . Multiple sclerosis Sister     Review of Systems: Pertinent items noted in HPI and remainder of comprehensive ROS otherwise negative.  PHYSICAL EXAM: Blood pressure 119/61, pulse 88, temperature 98.6 F (37 C), temperature source Oral, resp. rate 14, height 5\' 1"  (1.549 m), weight 57.2 kg, SpO2 100 %. CONSTITUTIONAL: Well-developed, well-nourished female in no acute distress.  HENT:  Normocephalic, atraumatic, External right and left ear normal. Oropharynx is clear and moist EYES: Conjunctivae and EOM are normal. Pupils are equal, round, and reactive to light. No scleral icterus.  NECK: Normal range of motion, supple, no masses SKIN: Skin is warm and dry. No rash noted. Not diaphoretic. No erythema. Mild pallor. NEUROLOGIC: Alert  and oriented to person, place, and time. Normal reflexes, muscle tone coordination. No cranial nerve deficit noted. PSYCHIATRIC: Normal mood and affect. Normal behavior. Normal judgment and thought content. CARDIOVASCULAR: Normal heart rate noted, regular rhythm RESPIRATORY: Effort and breath sounds normal, no problems with respiration noted ABDOMEN: Soft, nontender, nondistended. PELVIC: Normal appearing external genitalia; internal exam deferred at this point (unremarkable as per ED exam).  Moderate blood on pad noted. MUSCULOSKELETAL: Normal range of motion. No tenderness.  No cyanosis, clubbing, or edema.  2+ distal pulses.  Labs:  CBC Latest Ref Rng &  Units 07/25/2018 04/08/2018 03/20/2018  WBC 4.0 - 10.5 K/uL 6.6 9.5 7.1  Hemoglobin 12.0 - 15.0 g/dL 7.7(L) 12.5 13.2  Hematocrit 36.0 - 46.0 % 23.6(L) 37.1 37.1  Platelets 150 - 400 K/uL 288 352 300   Results for orders placed or performed during the hospital encounter of 07/25/18 (from the past 336 hour(s))  CBC with Differential   Collection Time: 07/25/18  7:30 AM  Result Value Ref Range   WBC 6.6 4.0 - 10.5 K/uL   RBC 2.53 (L) 3.87 - 5.11 MIL/uL   Hemoglobin 7.7 (L) 12.0 - 15.0 g/dL   HCT 23.6 (L) 36.0 - 46.0 %   MCV 93.3 80.0 - 100.0 fL   MCH 30.4 26.0 - 34.0 pg   MCHC 32.6 30.0 - 36.0 g/dL   RDW 12.2 11.5 - 15.5 %   Platelets 288 150 - 400 K/uL   nRBC 0.0 0.0 - 0.2 %   Neutrophils Relative % 64 %   Neutro Abs 4.2 1.7 - 7.7 K/uL   Lymphocytes Relative 25 %   Lymphs Abs 1.7 0.7 - 4.0 K/uL   Monocytes Relative 10 %   Monocytes Absolute 0.6 0.1 - 1.0 K/uL   Eosinophils Relative 1 %   Eosinophils Absolute 0.1 0.0 - 0.5 K/uL   Basophils Relative 0 %   Basophils Absolute 0.0 0.0 - 0.1 K/uL   Immature Granulocytes 0 %   Abs Immature Granulocytes 0.01 0.00 - 0.07 K/uL  Basic metabolic panel   Collection Time: 07/25/18  7:30 AM  Result Value Ref Range   Sodium 136 135 - 145 mmol/L   Potassium 3.6 3.5 - 5.1 mmol/L   Chloride 104 98 - 111 mmol/L   CO2 24 22 - 32 mmol/L   Glucose, Bld 103 (H) 70 - 99 mg/dL   BUN <5 (L) 6 - 20 mg/dL   Creatinine, Ser 0.75 0.44 - 1.00 mg/dL   Calcium 8.2 (L) 8.9 - 10.3 mg/dL   GFR calc non Af Amer >60 >60 mL/min   GFR calc Af Amer >60 >60 mL/min   Anion gap 8 5 - 15  I-Stat beta hCG blood, ED   Collection Time: 07/25/18  7:36 AM  Result Value Ref Range   I-stat hCG, quantitative <5.0 <5 mIU/mL   Comment 3          Wet prep, genital   Collection Time: 07/25/18  7:56 AM   Specimen: Vaginal  Result Value Ref Range   Yeast Wet Prep HPF POC NONE SEEN NONE SEEN   Trich, Wet Prep NONE SEEN NONE SEEN   Clue Cells Wet Prep HPF POC NONE SEEN NONE  SEEN   WBC, Wet Prep HPF POC MANY (A) NONE SEEN   Sperm NONE SEEN   Prepare RBC   Collection Time: 07/25/18  8:13 AM  Result Value Ref Range   Order Confirmation      ORDER PROCESSED  BY BLOOD BANK Performed at [facility], [place] [address] [place], [place] [address]   Type and screen [place]   Collection Time: 07/25/18  8:17 AM  Result Value Ref Range   ABO/RH(D) B POS    Antibody Screen NEG    Sample Expiration      07/28/2018,2359 Performed at [facility], [place] [address], [place], [place] [address]    Unit Number [id number]    Blood Component Type RED CELLS,LR    Unit division 00    Status of Unit ALLOCATED    Transfusion Status OK TO TRANSFUSE    Crossmatch Result Compatible    Unit Number [id number]    Blood Component Type RED CELLS,LR    Unit division 00    Status of Unit ALLOCATED    Transfusion Status OK TO TRANSFUSE    Crossmatch Result Compatible    Unit Number [id number]    Blood Component Type RBC LR PHER2    Unit division 00    Status of Unit ALLOCATED    Transfusion Status OK TO TRANSFUSE    Crossmatch Result Compatible   ABO/Rh   Collection Time: 07/25/18  8:17 AM  Result Value Ref Range   ABO/RH(D)      B POS Performed at [facility], [place] [address], [place], [place] [address]   BPAM [facility]   Collection Time: 07/25/18  8:17 AM  Result Value Ref Range   Blood Product Unit Number [id number]    PRODUCT CODE E0336V00    Unit Type and Rh 9500    Blood Product Expiration Date 631497026378    ISSUE DATE / TIME 588502774128    Blood Product Unit Number [id number]    PRODUCT CODE G2836O29    Unit Type and Rh 7300    Blood Product Expiration Date 476546503546    Blood Product Unit Number [id number]    PRODUCT CODE C9449Q75    Unit Type and Rh 7300    Blood Product Expiration Date 916384665993   SARS Coronavirus 2 (CEPHEID - Performed in [place] hospital lab), [facility] Order   Collection Time:  07/25/18  9:03 AM   Specimen: Nasopharyngeal Swab  Result Value Ref Range   SARS Coronavirus 2 NEGATIVE NEGATIVE  TSH   Collection Time: 07/25/18  9:25 AM  Result Value Ref Range   TSH 1.465 0.350 - 4.500 uIU/mL    Imaging Studies: No results found.  Assessment: Principal Problem:   Symptomatic anemia Active Problems:   Abnormal uterine bleeding (AUB)   Plan: Admit to 6N Unit Continue transfusion of 2 pBRCs as ordered, follow up labs to be done 7/5 morning. Megace 80 mg po bid, Naproxen 500 mg po bid and Lysteda 1300 mg po tid ordered Pelvic ultrasound ordered, will follow up results and manage accordingly. Discussed possible etiologies of AUB with patient: hormonal (including perimenopausal changes), structural, infectious etc.  Will follow up on all pending results.  Management for now is medical, may need surgical intervention depending on ultrasound findings. There is no need for urgent surgical intervention at the moment. She will need outpatient follow up, message sent to [facility] to arrange for follow up in 2-3 weeks with a female provider (preferred by patient). Will continue close observation overnight, will likely discharge in the morning if she remains stable.    Verita Schneiders, MD, [place] for [employer] Phone: [phone]

## 2018-07-25 NOTE — ED Notes (Signed)
ED TO INPATIENT HANDOFF REPORT  ED Nurse Name and Phone #: [phone]  S Name/Age/Gender Jordan Waters 43 y.o. female Room/Bed: 035C/035C  Code Status   Code Status: Not on file  Home/SNF/Other Home Patient oriented to: self, place, time and situation Is this baseline? Yes   Triage Complete: Triage complete  Chief Complaint hemorrhaging  Triage Note C/o vaginal bleeding since mid June , states she started passing clots this am.    Allergies No Known Allergies  Level of Care/Admitting Diagnosis ED Disposition    ED Disposition Condition [facility] Area: [place] [100100]  Level of Care: Med-Surg [16]  Covid Evaluation: Asymptomatic Screening Protocol (No Symptoms)  Diagnosis: Vaginal bleeding [628315]  Admitting Physician: Osborne Oman [[id number]]  Attending Physician: Verita Schneiders A [[id number]]  PT Class (Do Not Modify): Observation [104]  PT Acc Code (Do Not Modify): Observation [10022]       B Medical/Surgery History Past Medical History:  Diagnosis Date  . Depression with anxiety   . Migraine    History reviewed. No pertinent surgical history.   A IV Location/Drains/Wounds Patient Lines/Drains/Airways Status   Active Line/Drains/Airways    Name:   Placement date:   Placement time:   Site:   Days:   Peripheral IV 07/25/18 Left Hand   07/25/18    0817    Hand   less than 1          Intake/Output Last 24 hours No intake or output data in the 24 hours ending 07/25/18 0856  Labs/Imaging Results for orders placed or performed during the hospital encounter of 07/25/18 (from the past 48 hour(s))  CBC with Differential     Status: Abnormal   Collection Time: 07/25/18  7:30 AM  Result Value Ref Range   WBC 6.6 4.0 - 10.5 K/uL   RBC 2.53 (L) 3.87 - 5.11 MIL/uL   Hemoglobin 7.7 (L) 12.0 - 15.0 g/dL   HCT 23.6 (L) 36.0 - 46.0 %   MCV 93.3 80.0 - 100.0 fL   MCH 30.4 26.0 - 34.0 pg   MCHC 32.6 30.0 - 36.0 g/dL   RDW  12.2 11.5 - 15.5 %   Platelets 288 150 - 400 K/uL   nRBC 0.0 0.0 - 0.2 %   Neutrophils Relative % 64 %   Neutro Abs 4.2 1.7 - 7.7 K/uL   Lymphocytes Relative 25 %   Lymphs Abs 1.7 0.7 - 4.0 K/uL   Monocytes Relative 10 %   Monocytes Absolute 0.6 0.1 - 1.0 K/uL   Eosinophils Relative 1 %   Eosinophils Absolute 0.1 0.0 - 0.5 K/uL   Basophils Relative 0 %   Basophils Absolute 0.0 0.0 - 0.1 K/uL   Immature Granulocytes 0 %   Abs Immature Granulocytes 0.01 0.00 - 0.07 K/uL    Comment: Performed at [facility], [address], [place], [place] [address]  Basic metabolic panel     Status: Abnormal   Collection Time: 07/25/18  7:30 AM  Result Value Ref Range   Sodium 136 135 - 145 mmol/L   Potassium 3.6 3.5 - 5.1 mmol/L   Chloride 104 98 - 111 mmol/L   CO2 24 22 - 32 mmol/L   Glucose, Bld 103 (H) 70 - 99 mg/dL   BUN <5 (L) 6 - 20 mg/dL   Creatinine, Ser 0.75 0.44 - 1.00 mg/dL   Calcium 8.2 (L) 8.9 - 10.3 mg/dL   GFR calc non  Af Amer >60 >60 mL/min   GFR calc Af Amer >60 >60 mL/min   Anion gap 8 5 - 15    Comment: Performed at [place] [address], [place], [place] [address]  I-Stat beta hCG blood, ED     Status: None   Collection Time: 07/25/18  7:36 AM  Result Value Ref Range   I-stat hCG, quantitative <5.0 <5 mIU/mL   Comment 3            Comment:   GEST. AGE      CONC.  (mIU/mL)   <=1 WEEK        5 - 50     2 WEEKS       50 - 500     3 WEEKS       100 - 10,000     4 WEEKS     1,000 - 30,000        FEMALE AND NON-PREGNANT FEMALE:     LESS THAN 5 mIU/mL   Wet prep, genital     Status: Abnormal   Collection Time: 07/25/18  7:56 AM   Specimen: Vaginal  Result Value Ref Range   Yeast Wet Prep HPF POC NONE SEEN NONE SEEN   Trich, Wet Prep NONE SEEN NONE SEEN   Clue Cells Wet Prep HPF POC NONE SEEN NONE SEEN   WBC, Wet Prep HPF POC MANY (A) NONE SEEN   Sperm NONE SEEN     Comment: Performed at [facility], [place] [address] [place], [place] [phone]   Type and screen [place]     Status: None (Preliminary result)   Collection Time: 07/25/18  8:17 AM  Result Value Ref Range   ABO/RH(D) B POS    Antibody Screen PENDING    Sample Expiration      07/28/2018,2359 Performed at [facility], [place] [address], [place], [place] [address]    No results found.  Pending Labs Unresulted Labs (From admission, onward)    Start     Ordered   07/25/18 0852  SARS Coronavirus 2 (CEPHEID - Performed in [facility] hospital lab), [place]  (Asymptomatic Patients Labs)  Once,   STAT    Question:  Rule Out  Answer:  Yes   07/25/18 0851   07/25/18 0850  TSH  ONCE - STAT,   STAT     07/25/18 0849   07/25/18 0812  Prepare RBC  (Adult Blood Administration - PRBC)  Once,   R    Question Answer Comment  # of Units 2 units   Transfusion Indications Symptomatic Anemia   Transfusion Indications Actively Bleeding / GI Bleed   Number of Units to Keep Ahead 2 units ahead   If emergent release call blood bank [facility] [phone]      07/25/18 0811   07/25/18 0755  Urinalysis, Routine w reflex microscopic  ONCE - STAT,   STAT     07/25/18 0755          Vitals/Pain Today's Vitals   07/25/18 0721 07/25/18 0815 07/25/18 0820 07/25/18 0830  BP: 99/66 94/70  (!) 96/52  Pulse: 99 96  97  Resp: 20   18  Temp:      TempSrc:      SpO2: 97% 99%  98%  Weight:      Height:      PainSc:   0-No pain     Isolation Precautions No  active isolations  Medications Medications  0.9 %  sodium chloride infusion (has no administration in time range)  conjugated estrogens (PREMARIN) injection 25 mg (has no administration in time range)  megestrol (MEGACE) tablet 80 mg (has no administration in time range)    Mobility walks Low fall risk   Focused Assessments    R Recommendations: See Admitting Provider Note  Report given to:   Additional Notes:

## 2018-07-25 NOTE — ED Triage Notes (Signed)
C/o vaginal bleeding since mid June , states she started passing clots this am.

## 2018-07-25 NOTE — ED Notes (Signed)
Has had vag bleeding since before father's day-- starting yesterday states bleeding would not stop-- using super plus tampons changing every hour--

## 2018-07-25 NOTE — ED Provider Notes (Addendum)
[facility] EMERGENCY DEPARTMENT Provider Note   CSN: [id number] Arrival date & time: 07/25/18  6144    History   Chief Complaint Chief Complaint  Patient presents with  . Vaginal Bleeding    HPI Jordan Waters is a 43 y.o. female history of depression, anxiety, chronic migraine who presents with a 2 to 3-week history of vaginal bleeding.  Patient reports she is passing clots the size of a plum to a peach pit.  She is feeling a super tampon and max pad every hour.  She is having associated pressure and lower abdominal cramping.  She denies any urinary symptoms, back pain, fever, chest pain, shortness of breath.  She is feeling faint and lightheaded.  She passed out a few times on standing.  She does not have an OB/GYN.  She reports that this is around the age women in her family began menopause.  She has been taking naproxen at home without significant relief, although it has been helping her pain a little bit.     HPI  Past Medical History:  Diagnosis Date  . Depression with anxiety   . Migraine     Patient Active Problem List   Diagnosis Date Noted  . Vaginal bleeding 07/25/2018  . Chronic migraine 04/08/2018  . Neck pain 04/08/2018  . Gait disturbance 04/08/2018  . Urinary incontinence 04/08/2018  . Depression with anxiety 04/08/2018  . Photophobia of both eyes 04/08/2018  . Fatigue 04/07/2018  . Healthcare maintenance 04/07/2018  . Neuropathic pain 04/07/2018    History reviewed. No pertinent surgical history.   OB History   No obstetric history on file.      Home Medications    Prior to Admission medications   Medication Sig Start Date End Date Taking? Authorizing Provider  acetaminophen (TYLENOL) 650 MG CR tablet Take 650 mg by mouth every 8 (eight) hours as needed for pain.    [provider]  acetaminophen-codeine (TYLENOL #3) 300-30 MG tablet Take 1 tablet by mouth at bedtime. 03/12/18   [provider]  DULoxetine  (CYMBALTA) 60 MG capsule Take 1 capsule (60 mg total) by mouth daily. 04/08/18   Sater, Nanine Means, MD  gabapentin (NEURONTIN) 100 MG capsule Take 1 capsule (100 mg total) by mouth 3 (three) times daily. 06/09/18   DanfordBerna Spare, NP    Family History Family History  Problem Relation Age of Onset  . Heart disease Paternal Uncle   . Heart disease Paternal Grandfather   . Multiple sclerosis Mother   . Heart disease Father   . Multiple sclerosis Sister     Social History Social History   Tobacco Use  . Smoking status: Former Smoker    Types: Cigarettes    Quit date: 02/15/2018    Years since quitting: 0.4  . Smokeless tobacco: Never Used  Substance Use Topics  . Alcohol use: Not Currently  . Drug use: Never     Allergies   Patient has no known allergies.   Review of Systems Review of Systems  Constitutional: Negative for chills and fever.  HENT: Negative for facial swelling and sore throat.   Respiratory: Negative for shortness of breath.   Cardiovascular: Negative for chest pain.  Gastrointestinal: Positive for abdominal pain and nausea. Negative for vomiting.  Genitourinary: Positive for vaginal bleeding. Negative for dysuria.  Musculoskeletal: Negative for back pain.  Skin: Negative for rash and wound.  Neurological: Positive for light-headedness. Negative for headaches.  Psychiatric/Behavioral: The patient is not  nervous/anxious.      Physical Exam Updated Vital Signs BP (!) 96/52   Pulse 97   Temp 98.3 F (36.8 C) (Oral)   Resp 18   Ht 5\' 1"  (1.549 m)   Wt 54.4 kg   SpO2 98%   BMI 22.67 kg/m   Physical Exam Vitals signs and nursing note reviewed. Exam conducted with a chaperone present.  Constitutional:      General: She is not in acute distress.    Appearance: She is well-developed. She is not diaphoretic.  HENT:     Head: Normocephalic and atraumatic.     Mouth/Throat:     Pharynx: No oropharyngeal exudate.  Eyes:     General: No scleral  icterus.       Right eye: No discharge.        Left eye: No discharge.     Conjunctiva/sclera: Conjunctivae normal.     Pupils: Pupils are equal, round, and reactive to light.  Neck:     Musculoskeletal: Normal range of motion and neck supple.     Thyroid: No thyromegaly.  Cardiovascular:     Rate and Rhythm: Normal rate and regular rhythm.     Heart sounds: Normal heart sounds. No murmur. No friction rub. No gallop.   Pulmonary:     Effort: Pulmonary effort is normal. No respiratory distress.     Breath sounds: Normal breath sounds. No stridor. No wheezing or rales.  Abdominal:     General: Bowel sounds are normal. There is no distension.     Palpations: Abdomen is soft.     Tenderness: There is no abdominal tenderness. There is no right CVA tenderness, left CVA tenderness, guarding or rebound.  Genitourinary:    Vagina: Bleeding present.     Cervix: Cervical bleeding present. No cervical motion tenderness.     Uterus: Tender.      Adnexa:        Right: No tenderness.         Left: No tenderness.    Lymphadenopathy:     Cervical: No cervical adenopathy.  Skin:    General: Skin is warm and dry.     Coloration: Skin is not pale.     Findings: No rash.  Neurological:     Mental Status: She is alert.     Coordination: Coordination normal.      ED Treatments / Results  Labs (all labs ordered are listed, but only abnormal results are displayed) Labs Reviewed  WET PREP, GENITAL - Abnormal; Notable for the following components:      Result Value   WBC, Wet Prep HPF POC MANY (*)    All other components within normal limits  CBC WITH DIFFERENTIAL/PLATELET - Abnormal; Notable for the following components:   RBC 2.53 (*)    Hemoglobin 7.7 (*)    HCT 23.6 (*)    All other components within normal limits  BASIC METABOLIC PANEL - Abnormal; Notable for the following components:   Glucose, Bld 103 (*)    BUN <5 (*)    Calcium 8.2 (*)    All other components within normal limits   SARS CORONAVIRUS 2 (HOSPITAL ORDER, [facility])  URINALYSIS, ROUTINE W REFLEX MICROSCOPIC  TSH  I-STAT BETA HCG BLOOD, ED ([facility], [facility], [facility] ONLY)  TYPE AND SCREEN  PREPARE RBC (CROSSMATCH)  GC/CHLAMYDIA PROBE AMP ([place]) NOT AT [facility]    EKG None  Radiology No results found.  Procedures .Critical Care  Performed by: Frederica Kuster, PA-C Authorized by: Frederica Kuster, PA-C   Critical care provider statement:    Critical care time (minutes):  45   Critical care was necessary to treat or prevent imminent or life-threatening deterioration of the following conditions:  Shock and circulatory failure   Critical care was time spent personally by me on the following activities:  Discussions with consultants, evaluation of patient's response to treatment, examination of patient, ordering and performing treatments and interventions, ordering and review of laboratory studies, ordering and review of radiographic studies, pulse oximetry, re-evaluation of patient's condition, obtaining history from patient or surrogate and review of old charts   I assumed direction of critical care for this patient from another provider in my specialty: no     (including critical care time)  Medications Ordered in ED Medications  0.9 %  sodium chloride infusion (has no administration in time range)  conjugated estrogens (PREMARIN) injection 25 mg (has no administration in time range)  megestrol (MEGACE) tablet 80 mg (has no administration in time range)     Initial Impression / Assessment and Plan / ED Course  I have reviewed the triage vital signs and the nursing notes.  Pertinent labs & imaging results that were available during my care of the patient were reviewed by me and considered in my medical decision making (see chart for details).        Patient presenting with vaginal bleeding with a second given dropping hemoglobin to 7.7.  Patient reports she has passed out a  few times.  She is mildly tachycardic and hypotensive.  She is pale.  Patient is not hemorrhaging on pelvic exam, but is having slow bleeding at the cervical os.  GC/chlamydia sent and pending.  hCG negative.  Wet prep shows many WBCs only. 2 units of packed RBCs ordered. I discussed patient case with Dr. Harolyn Rutherford with OB/GYN who will admit the patient to her service.  She recommends Premarin and Megace, as well as pelvic ultrasound.  I appreciate her assistance with the patient.  COVID-19 screening pending.  Patient understands and agrees with plan.  Patient also evaluated by my attending, Dr. Ralene Bathe, who guided the patient's management and agrees with plan.  Final Clinical Impressions(s) / ED Diagnoses   Final diagnoses:  Vaginal bleeding    ED Discharge Orders    None           Frederica Kuster, PA-C 07/25/18 0902    Quintella Reichert, MD 07/26/18 1102

## 2018-07-26 ENCOUNTER — Observation Stay (HOSPITAL_COMMUNITY): Payer: Managed Care, Other (non HMO)

## 2018-07-26 DIAGNOSIS — N939 Abnormal uterine and vaginal bleeding, unspecified: Secondary | ICD-10-CM | POA: Diagnosis not present

## 2018-07-26 DIAGNOSIS — N888 Other specified noninflammatory disorders of cervix uteri: Secondary | ICD-10-CM | POA: Diagnosis present

## 2018-07-26 DIAGNOSIS — D649 Anemia, unspecified: Secondary | ICD-10-CM | POA: Diagnosis not present

## 2018-07-26 LAB — CBC
HCT: 26.7 % — ABNORMAL LOW (ref 36.0–46.0)
Hemoglobin: 9.1 g/dL — ABNORMAL LOW (ref 12.0–15.0)
MCH: 30.6 pg (ref 26.0–34.0)
MCHC: 34.1 g/dL (ref 30.0–36.0)
MCV: 89.9 fL (ref 80.0–100.0)
Platelets: 260 10*3/uL (ref 150–400)
RBC: 2.97 MIL/uL — ABNORMAL LOW (ref 3.87–5.11)
RDW: 13.4 % (ref 11.5–15.5)
WBC: 9.1 10*3/uL (ref 4.0–10.5)
nRBC: 0 % (ref 0.0–0.2)

## 2018-07-26 LAB — CBC WITH DIFFERENTIAL/PLATELET
Abs Immature Granulocytes: 0.02 10*3/uL (ref 0.00–0.07)
Basophils Absolute: 0 10*3/uL (ref 0.0–0.1)
Basophils Relative: 0 %
Eosinophils Absolute: 0 10*3/uL (ref 0.0–0.5)
Eosinophils Relative: 0 %
HCT: 28.5 % — ABNORMAL LOW (ref 36.0–46.0)
Hemoglobin: 9.7 g/dL — ABNORMAL LOW (ref 12.0–15.0)
Immature Granulocytes: 0 %
Lymphocytes Relative: 41 %
Lymphs Abs: 3 10*3/uL (ref 0.7–4.0)
MCH: 30.5 pg (ref 26.0–34.0)
MCHC: 34 g/dL (ref 30.0–36.0)
MCV: 89.6 fL (ref 80.0–100.0)
Monocytes Absolute: 0.6 10*3/uL (ref 0.1–1.0)
Monocytes Relative: 8 %
Neutro Abs: 3.6 10*3/uL (ref 1.7–7.7)
Neutrophils Relative %: 51 %
Platelets: 253 10*3/uL (ref 150–400)
RBC: 3.18 MIL/uL — ABNORMAL LOW (ref 3.87–5.11)
RDW: 12.9 % (ref 11.5–15.5)
WBC: 7.3 10*3/uL (ref 4.0–10.5)
nRBC: 0 % (ref 0.0–0.2)

## 2018-07-26 LAB — BASIC METABOLIC PANEL
Anion gap: 8 (ref 5–15)
BUN: 9 mg/dL (ref 6–20)
CO2: 23 mmol/L (ref 22–32)
Calcium: 8.5 mg/dL — ABNORMAL LOW (ref 8.9–10.3)
Chloride: 107 mmol/L (ref 98–111)
Creatinine, Ser: 0.71 mg/dL (ref 0.44–1.00)
GFR calc Af Amer: >60 mL/min (ref >60)
GFR calc non Af Amer: >60 mL/min (ref >60)
Glucose, Bld: 100 mg/dL — ABNORMAL HIGH (ref 70–99)
Potassium: 3.5 mmol/L (ref 3.5–5.1)
Sodium: 138 mmol/L (ref 135–145)

## 2018-07-26 IMAGING — MR MRI PELVIS WITHOUT AND WITH CONTRAST
12 of 29 series · 25 of 48 positions shown · IV contrast (Gadavist)
Comparison: [DATE] pelvic sonogram.

CLINICAL DATA: 42-year-old female inpatient with abnormal uterine
bleeding with abnormal pelvic ultrasound.

EXAM:
MRI PELVIS WITHOUT AND WITH CONTRAST
TECHNIQUE: Multiplanar multisequence MR imaging of the pelvis was performed
both before and after administration of intravenous contrast.
CONTRAST:  5.6 cc Gadavist IV.

[Series 2: T2 · coronal · 5.0mm · 1.41mm/px · 2 of 30 slices shown]
[im 1/30]
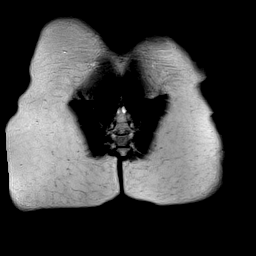
[im 30/30]
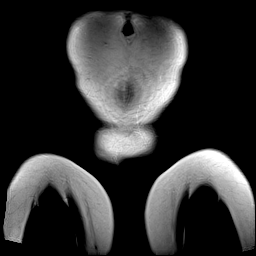

[Series 3: ax tse trig · axial · 5.0mm · 0.75mm/px · z∈[-208,+14]mm · 2 of 38 slices shown]
[im 1/38]
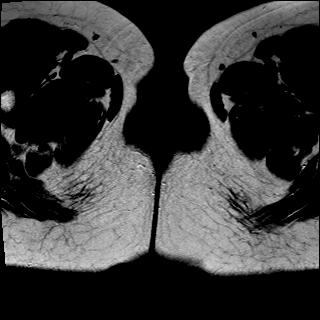
[im 38/38]
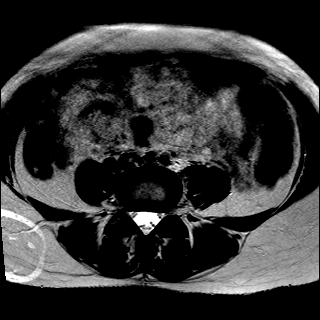

[Series 4: ax tse fs · axial · 5.0mm · 0.75mm/px · z∈[-205,+11]mm · 2 of 37 slices shown]
[im 1/37]
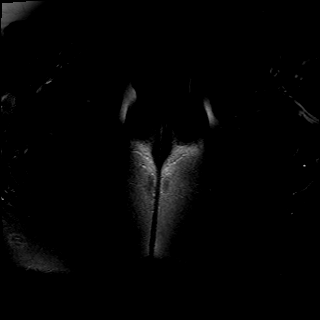
[im 37/37]
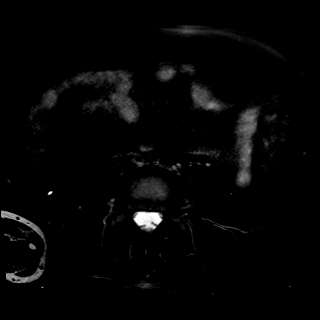

[Series 5: sag tse trig · sagittal · 5.0mm · 0.75mm/px · 1 of 33 slices shown]
[im 1/33]
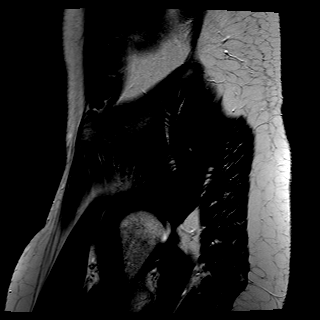

[Series 6: T1 dynamic fat-sat · axial · 1.4mm · 0.78mm/px · z∈[-187,-10]mm · 3 of 256 slices shown]
[im 1/256]
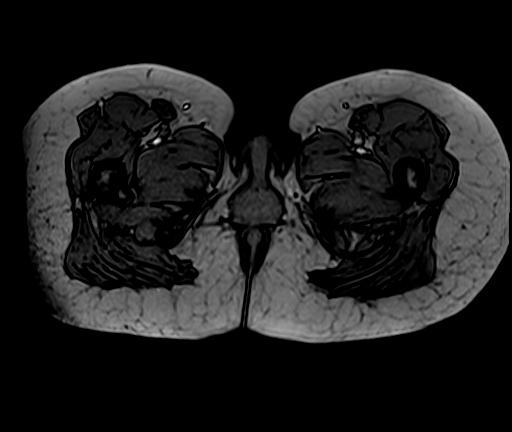
[im 128/256]
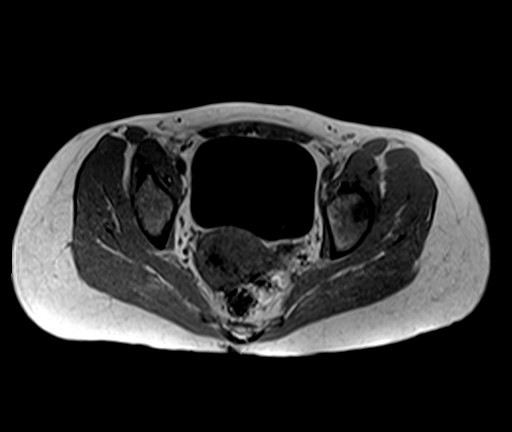
[im 256/256]
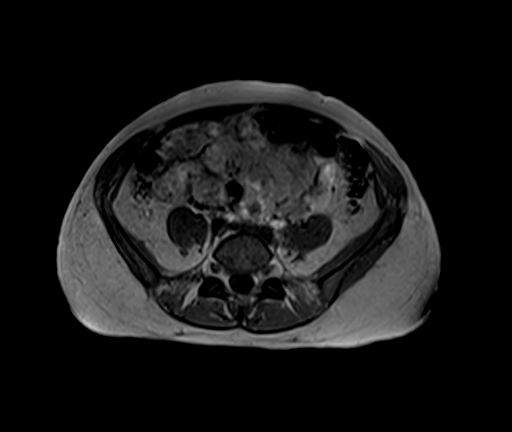

[Series 7: T1 dynamic · axial · 1.4mm · 0.78mm/px · 1 of 128 slices shown (1 of 4)]
[im 1/128]
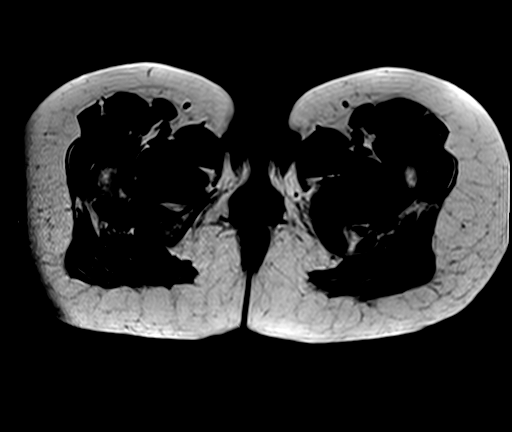

[Series 8: T1 dynamic · axial · 1.4mm · 0.78mm/px · 1 of 128 slices shown (2 of 4)]
[im 1/128]
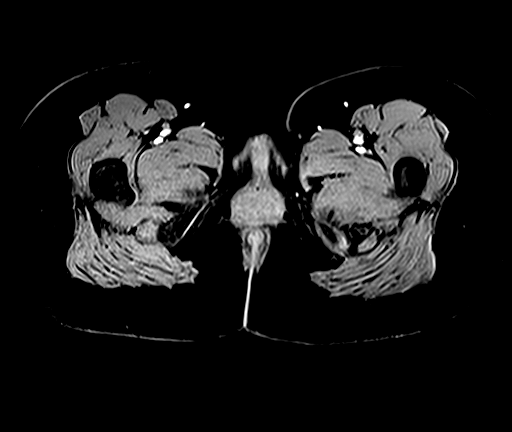

[Series 9: T1 dynamic post-contrast · axial · 1.4mm · 0.78mm/px · z∈[-187,-10]mm · 3 of 256 slices shown (1 of 3)]
[im 1/256]
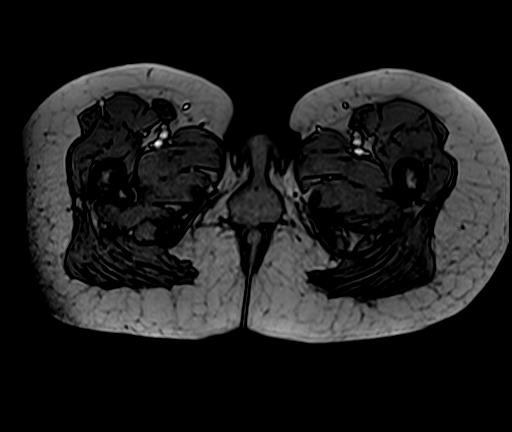
[im 128/256]
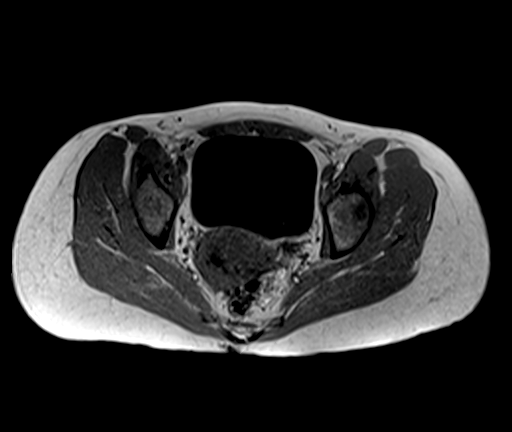
[im 256/256]
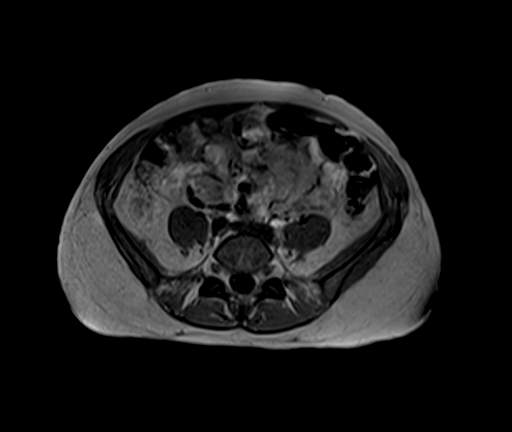

[Series 10: T1 dynamic post-contrast · axial · 1.4mm · 0.78mm/px · z∈[-187,-10]mm · 3 of 256 slices shown (2 of 3)]
[im 1/256]
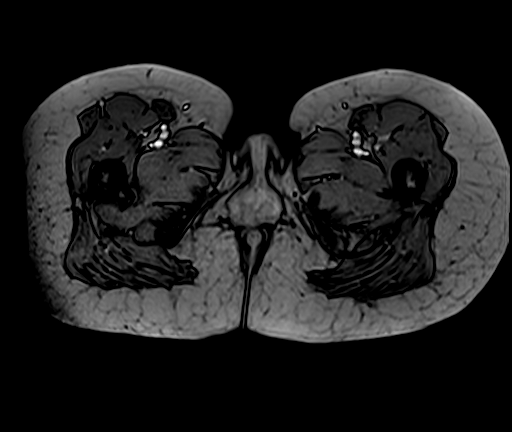
[im 128/256]
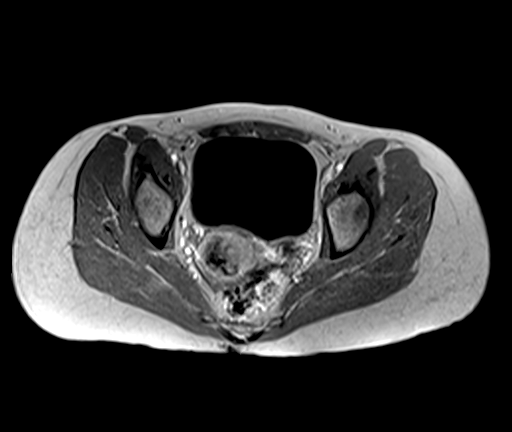
[im 256/256]
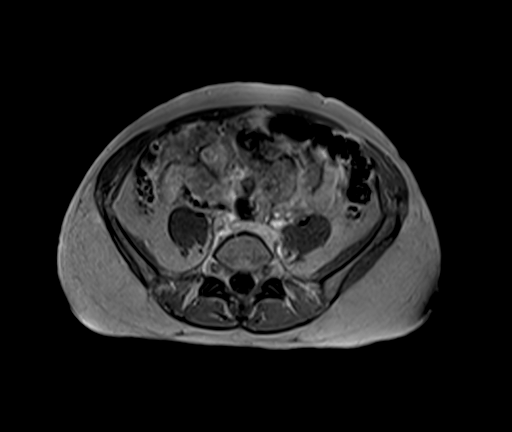

[Series 11: T1 dynamic · axial · 1.4mm · 0.78mm/px · z∈[-187,-10]mm · 3 of 256 slices shown (3 of 4)]
[im 1/256]
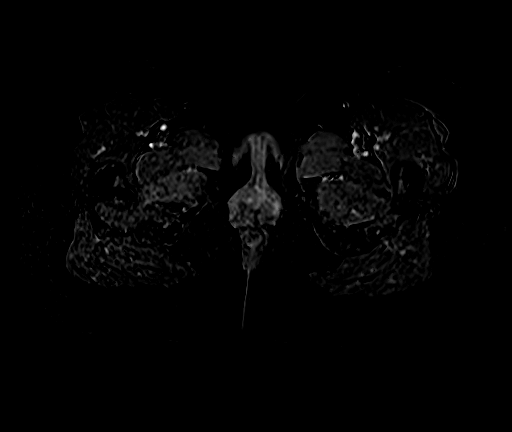
[im 128/256]
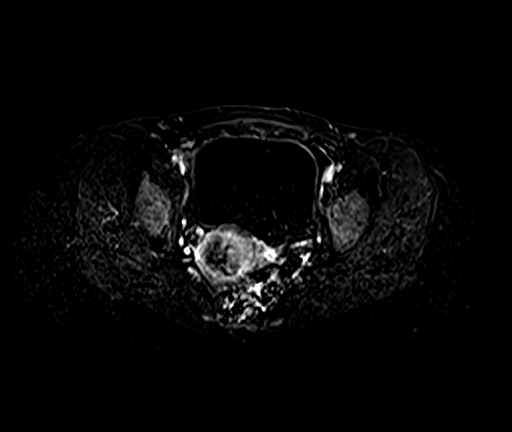
[im 256/256]
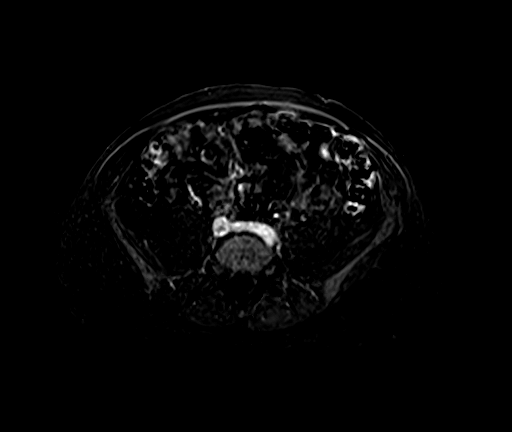

[Series 12: T1 dynamic post-contrast · axial · 1.4mm · 0.78mm/px · z∈[-187,-10]mm · 3 of 256 slices shown (3 of 3)]
[im 1/256]
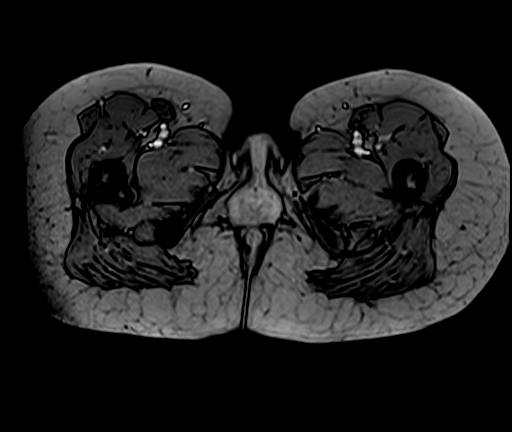
[im 128/256]
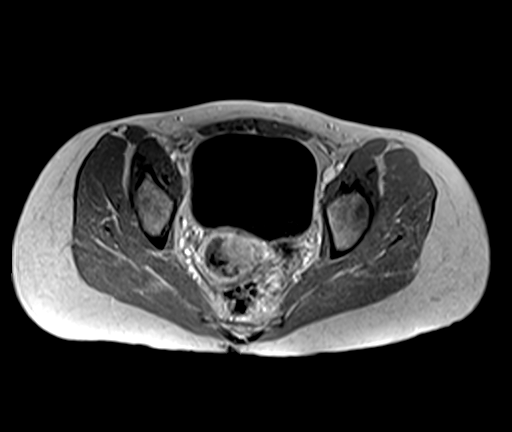
[im 256/256]
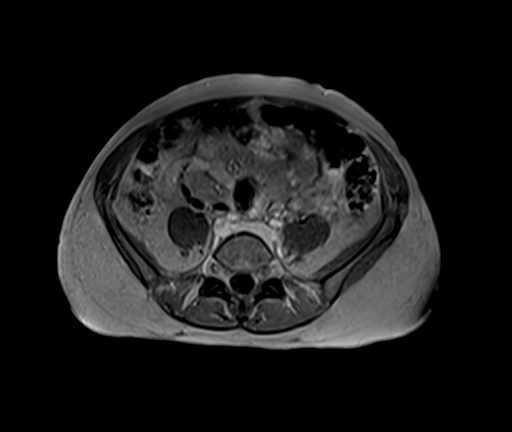

[Series 13: T1 dynamic · axial · 1.4mm · 0.78mm/px · 1 of 256 slices shown (4 of 4)]
[im 1/256]
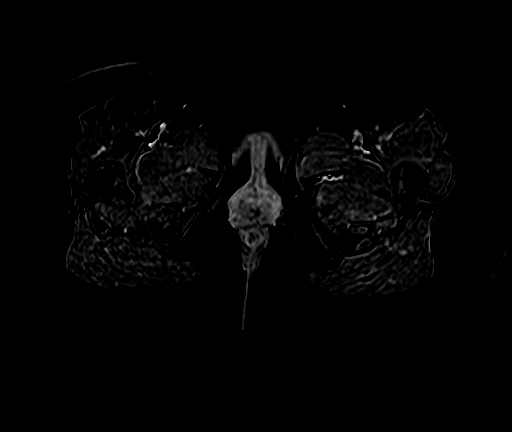

[25 of 48 positions shown; findings below may reference images not displayed]

FINDINGS: Urinary Tract:  Normal bladder.  Normal urethra.

Bowel: Visualized small and large bowel are normal caliber with no
bowel wall thickening.

Vascular/Lymphatic: No pathologically enlarged lymph nodes in the
pelvis. No acute vascular abnormality.

Reproductive:

Uterus: The mildly enlarged anteverted uterus measures 10.7 x 5.4 x
5.5 cm. No uterine fibroids. Inner myometrium (junctional zone)
measures 15 mm in thickness, abnormally thickened, compatible with
diffuse uterine adenomyosis. Endometrium measures 9 mm in bilayer
thickness. There is a 4.7 x 3.4 x 3.0 cm poorly marginated
infiltrative mass centered in the anterior left uterine cervix
(series 2/image 11, series 4/image 18, series 21/image 58), which
extends [DATE] to [DATE] in the lower cervix, and extends [DATE] to [DATE]
in the upper cervix, demonstrating intermediate T2 signal intensity
and enhancement. This mass demonstrates cervical stromal invasion of
at least 6 mm (series 3/image 19), with no frank parametrial
invasion. Mass appears to invade the lower most portion of the
uterine cavity. No evidence of vaginal or bladder invasion. Findings
are worrisome for a FIGO stage IB3 cervical carcinoma.

Ovaries and Adnexa: The right ovary measures 3.1 x 2.0 x 1.1 cm and
is normal. The left ovary measures 3.5 x 2.7 x 2.9 cm and contains a
simple 2.8 x 2.1 x 2.1 cm cyst. There are no suspicious ovarian or
adnexal masses.

Other: No abnormal free fluid in the pelvis. No focal pelvic fluid
collection.

Musculoskeletal: No aggressive appearing focal osseous lesions.
IMPRESSION: 1. Poorly marginated/infiltrative 4.7 x 3.4 x 3.0 cm mass of the
left uterine cervix with invasion of the cervical stroma, worrisome
for FIGO stage IB3 cervical carcinoma. No frank parametrial, bladder
or vaginal involvement. GYN ONC consultation suggested.
2. No pelvic adenopathy.
3. No suspicious ovarian or adnexal findings.

## 2018-07-26 MED ORDER — FERROUS SULFATE 325 (65 FE) MG PO TABS
325.0000 mg | ORAL_TABLET | Freq: Two times a day (BID) | ORAL | Status: DC
  Administered 2018-07-26 – 2018-07-28 (×5): 325 mg via ORAL
  Filled 2018-07-26 (×5): qty 1

## 2018-07-26 MED ORDER — GADOBUTROL 1 MMOL/ML IV SOLN
5.6000 mL | Freq: Once | INTRAVENOUS | Status: AC | PRN
  Administered 2018-07-26: 17:00:00 5.6 mL via INTRAVENOUS

## 2018-07-26 NOTE — Progress Notes (Signed)
Came to see patient and give her the news of the findings on MRI. She was understandably upset. She is continuing to bleed, with clots. Hgb 9.7-->9.1 today. Repeat in am. She would like to meet with GYN/ONC and they were asked to come see her in the am, to discuss next steps. She would need office visit with biopsy prior to definitive treatment and recommend D/C if stable.

## 2018-07-26 NOTE — Progress Notes (Signed)
[facility]N Attending Note  Subjective:  Patient reports seeing blood only when she goes to bathroom, then a moderate amount comes out of her vagina. Scant bleeding on pad while in bed. Denies lightheadedness or dizziness. Received 2 units of pRBCs yesterday.  Admitted on 07/25/2018 for Symptomatic anemia.    Objective:  Blood pressure 112/65, pulse 79, temperature 98.2 F (36.8 C), temperature source Oral, resp. rate 18, height 5\' 1"  (1.549 m), weight 57.2 kg, SpO2 100 %. Gen: NAD HENT: Normocephalic, atraumatic Lungs: Normal respiratory effort Heart: Regular rate noted Abdomen: NT, soft Cervix: Deferred Ext: 2+ DTRs, no edema, no cyanosis, negative Homan's sign  Labs: CBC Latest Ref Rng & Units 07/26/2018 07/25/2018 04/08/2018  WBC 4.0 - 10.5 K/uL 7.3 6.6 9.5  Hemoglobin 12.0 - 15.0 g/dL 9.7(L) 7.7(L) 12.5  Hematocrit 36.0 - 46.0 % 28.5(L) 23.6(L) 37.1  Platelets 150 - 400 K/uL 253 288 352   BMP Latest Ref Rng & Units 07/26/2018 07/25/2018 03/20/2018  Glucose 70 - 99 mg/dL 100(H) 103(H) 82  BUN 6 - 20 mg/dL 9 <5(L) 7  Creatinine 0.44 - 1.00 mg/dL 0.71 0.75 0.71  BUN/Creat Ratio 9 - 23 - - 10  Sodium 135 - 145 mmol/L 138 136 140  Potassium 3.5 - 5.1 mmol/L 3.5 3.6 4.5  Chloride 98 - 111 mmol/L 107 104 101  CO2 22 - 32 mmol/L 23 24 24   Calcium 8.9 - 10.3 mg/dL 8.5(L) 8.2(L) 9.9   Imaging: US Pelvic Complete With Transvaginal  Result Date: 07/25/2018 CLINICAL DATA:  Abnormal uterine bleeding for a month. EXAM: TRANSABDOMINAL AND TRANSVAGINAL ULTRASOUND OF PELVIS TECHNIQUE: Both transabdominal and transvaginal ultrasound examinations of the pelvis were performed. Transabdominal technique was performed for global imaging of the pelvis including uterus, ovaries, adnexal regions, and pelvic cul-de-sac. It was necessary to proceed with endovaginal exam following the transabdominal exam to visualize the endometrium and ovaries. COMPARISON:  None FINDINGS: Uterus Measurements: 9.4 x 4.2  x 5.7 cm = volume: 118.4 mL. No fibroids or other mass visualized. Endometrium The endometrium measures 3.8 mm in thickness at the fundus. However, there is a masslike appearance to the endometrium in the uterine body and lower uterine segment measuring 3.2 by 7.9 x 2.8 cm with internal blood flow. There is also fluid in the endometrial canal, likely blood given history. Right ovary Measurements: 2.5 x 1.2 x 1.3 cm = volume: 2 mL. Normal appearance/no adnexal mass. Left ovary Measurements: 3.5 x 2 x 3.1 cm = volume: 11.2 mL. Contains a 2.6 cm follicle. Other findings There is a small amount of free fluid in the pelvis, likely physiologic. IMPRESSION: 1. The endometrium has a masslike appearance in the uterine body and lower uterine segment measuring 3.2 x 7.9 x 2.8 cm. There is also fluid in the endometrial canal, likely blood products given history. The constellation of findings are concerning for an endometrial neoplasm. Recommend gynecologic consultation with endometrial biopsy. 2. No other abnormalities. Electronically Signed   By: Dorise Bullion III M.D   On: 07/25/2018 18:13    Assessment & Plan:  43 y.o. [id number] admitted for symptomatic anemia in the setting of AUB, now with concerning endometrial mass -  MRI of pelvis ordered for better characterization of mass.  Biopsy may be indicated subsequently for further elucidation and surgical management planning. This was discussed with patient, it is unsure at this point what this masslike lesion is. - Appropriate hemoglobin rise after transfusion, will give oral iron therapy. - Continue Megace, Lysteda  and Naproxen for now; monitor bleeding. - Routine floor care, disposition pending further evaluation of this endometrial lesion.   Verita Schneiders, MD, [place] [facility] [employer], [place] [phone]

## 2018-07-26 NOTE — Progress Notes (Signed)
Pt is still having heavy vaginal bleeding with blood clots when going to the bathroom, Dr. Laray Anger aware, 2 unit of prbc given, pt denies pain, for MRI this am.

## 2018-07-27 DIAGNOSIS — D61818 Other pancytopenia: Secondary | ICD-10-CM | POA: Diagnosis not present

## 2018-07-27 DIAGNOSIS — Z79899 Other long term (current) drug therapy: Secondary | ICD-10-CM | POA: Diagnosis not present

## 2018-07-27 DIAGNOSIS — R531 Weakness: Secondary | ICD-10-CM | POA: Diagnosis not present

## 2018-07-27 DIAGNOSIS — C538 Malignant neoplasm of overlapping sites of cervix uteri: Secondary | ICD-10-CM | POA: Diagnosis present

## 2018-07-27 DIAGNOSIS — Z87891 Personal history of nicotine dependence: Secondary | ICD-10-CM | POA: Diagnosis not present

## 2018-07-27 DIAGNOSIS — Z7689 Persons encountering health services in other specified circumstances: Secondary | ICD-10-CM | POA: Diagnosis not present

## 2018-07-27 DIAGNOSIS — N939 Abnormal uterine and vaginal bleeding, unspecified: Secondary | ICD-10-CM

## 2018-07-27 DIAGNOSIS — R55 Syncope and collapse: Secondary | ICD-10-CM | POA: Diagnosis present

## 2018-07-27 DIAGNOSIS — G43909 Migraine, unspecified, not intractable, without status migrainosus: Secondary | ICD-10-CM | POA: Diagnosis present

## 2018-07-27 DIAGNOSIS — R42 Dizziness and giddiness: Secondary | ICD-10-CM | POA: Diagnosis not present

## 2018-07-27 DIAGNOSIS — N888 Other specified noninflammatory disorders of cervix uteri: Secondary | ICD-10-CM | POA: Diagnosis not present

## 2018-07-27 DIAGNOSIS — Z510 Encounter for antineoplastic radiation therapy: Secondary | ICD-10-CM | POA: Diagnosis not present

## 2018-07-27 DIAGNOSIS — D62 Acute posthemorrhagic anemia: Secondary | ICD-10-CM | POA: Diagnosis present

## 2018-07-27 DIAGNOSIS — D649 Anemia, unspecified: Secondary | ICD-10-CM | POA: Diagnosis not present

## 2018-07-27 DIAGNOSIS — C539 Malignant neoplasm of cervix uteri, unspecified: Secondary | ICD-10-CM | POA: Diagnosis present

## 2018-07-27 DIAGNOSIS — F418 Other specified anxiety disorders: Secondary | ICD-10-CM | POA: Diagnosis present

## 2018-07-27 DIAGNOSIS — Z5111 Encounter for antineoplastic chemotherapy: Secondary | ICD-10-CM | POA: Diagnosis not present

## 2018-07-27 DIAGNOSIS — R140 Abdominal distension (gaseous): Secondary | ICD-10-CM | POA: Diagnosis not present

## 2018-07-27 DIAGNOSIS — Z8249 Family history of ischemic heart disease and other diseases of the circulatory system: Secondary | ICD-10-CM | POA: Diagnosis not present

## 2018-07-27 DIAGNOSIS — R197 Diarrhea, unspecified: Secondary | ICD-10-CM | POA: Diagnosis not present

## 2018-07-27 DIAGNOSIS — Z820 Family history of epilepsy and other diseases of the nervous system: Secondary | ICD-10-CM | POA: Diagnosis not present

## 2018-07-27 DIAGNOSIS — R112 Nausea with vomiting, unspecified: Secondary | ICD-10-CM | POA: Diagnosis not present

## 2018-07-27 DIAGNOSIS — Z1159 Encounter for screening for other viral diseases: Secondary | ICD-10-CM | POA: Diagnosis not present

## 2018-07-27 LAB — CBC
HCT: 25.5 % — ABNORMAL LOW (ref 36.0–46.0)
Hemoglobin: 8.7 g/dL — ABNORMAL LOW (ref 12.0–15.0)
MCH: 30.6 pg (ref 26.0–34.0)
MCHC: 34.1 g/dL (ref 30.0–36.0)
MCV: 89.8 fL (ref 80.0–100.0)
Platelets: 246 10*3/uL (ref 150–400)
RBC: 2.84 MIL/uL — ABNORMAL LOW (ref 3.87–5.11)
RDW: 13.2 % (ref 11.5–15.5)
WBC: 8.6 10*3/uL (ref 4.0–10.5)
nRBC: 0 % (ref 0.0–0.2)

## 2018-07-27 NOTE — Progress Notes (Signed)
Gynecology Progress Note  Admission Date: 07/25/2018 Current Date: 07/27/2018 10:57 AM  Jordan Waters is a 43 y.o. [id number] HD#3 admitted for syncopal episodes secondary to heavy vaginal bleeding. Found to have IB3 cervical cancer on MRI.  History complicated by: Patient Active Problem List   Diagnosis Date Noted  . Cervical mass 07/26/2018  . Abnormal uterine bleeding (AUB) 07/25/2018  . Symptomatic anemia 07/25/2018  . Chronic migraine 04/08/2018  . Neck pain 04/08/2018  . Gait disturbance 04/08/2018  . Urinary incontinence 04/08/2018  . Depression with anxiety 04/08/2018  . Fatigue 04/07/2018  . Healthcare maintenance 04/07/2018  . Neuropathic pain 04/07/2018    Subjective:  Patient feeling okay. Reports her bleeding is not as heavy as it has been, not having huge clots come out but still having some bleeding. Reports being on the stool softener is helping her pass stool easier so she is not straining as much and thinks she is bleeding less because of this. Denies nausea/vomiting. Tolerating regular diet. Voiding/passing stool without issue. Reports a headache and some light-headedness when standing.  Objective:   Vitals:   07/26/18 0424 07/26/18 1403 07/26/18 2110 07/27/18 0551  BP: 112/65 106/64 117/69 118/60  Pulse: 79 86 92 85  Resp: 18 18 18 18   Temp: 98.2 F (36.8 C) 97.8 F (36.6 C) 98.4 F (36.9 C) 98.3 F (36.8 C)  TempSrc: Oral Oral Oral Oral  SpO2: 100% 99% 100% 98%  Weight:      Height:        No intake/output data recorded. No intake or output data in the 24 hours ending 07/27/18 1057   Physical exam: BP 118/60 (BP Location: Right Arm)   Pulse 85   Temp 98.3 F (36.8 C) (Oral)   Resp 18   Ht 5\' 1"  (1.549 m)   Wt 57.2 kg   SpO2 98%   BMI 23.83 kg/m  CONSTITUTIONAL: Well-developed, well-nourished female in mild distress.  HENT:  Normocephalic, atraumatic, External right and left ear normal. Oropharynx is clear and moist EYES: Conjunctivae  and EOM are normal. Pupils are equal, round, and reactive to light. No scleral icterus.  NECK: Normal range of motion, supple, no masses.  Normal thyroid.  SKIN: Skin is warm and dry. No rash noted. Not diaphoretic. No erythema. No pallor. NEUROLOGIC: Alert and oriented to person, place, and time. Normal reflexes, muscle tone coordination. No cranial nerve deficit noted. PSYCHIATRIC: Normal mood and affect. Normal behavior. Normal judgment and thought content. CARDIOVASCULAR: Normal heart rate noted, regular rhythm RESPIRATORY: Effort normal, no problems with respiration noted. ABDOMEN: no distention noted  PELVIC: deferred MUSCULOSKELETAL: Normal range of motion. No tenderness.  No cyanosis, clubbing, or edema.    UOP: voiding spontaneously  Labs  Recent Labs  Lab 07/26/18 0551 07/26/18 1445 07/27/18 0313  WBC 7.3 9.1 8.6  HGB 9.7* 9.1* 8.7*  HCT 28.5* 26.7* 25.5*  PLT 253 260 246     Assessment & Plan:   Patient is 43 y.o. [id number] HD#3 admitted for syncopal episodes secondary to heavy vaginal bleeding, found to have IB3 cervical cancer on MRI. S/p blood transfusion. She is appropriately upset, tearful at times during interview today. Asking appropriate questions regarding plan. Feeling better than in prior days with less bleeding but still somewhat light-headed. H/H downtrending slightly from yesterday, but not significantly worse.  GYN/ONC has been consulted, will await consult and recommendations Cont megace Regular diet Repeat CBC in am if patient not discharged    K. Meryl  Rosana Hoes, M.D. Attending [facility] [place] [employer] [facility])   For OB/GYN issues, please call the [facility] for [place] at [place] Monday - Friday, 8 am - 5 pm: [phone] All other times: [phone]

## 2018-07-27 NOTE — Consult Note (Signed)
Consult Note: Gyn-Onc  Consult was requested by Dr. Kennon Rounds for the evaluation of [name] 43 y.o. female  CC:  Chief Complaint  Patient presents with  . Vaginal Bleeding    Assessment/Plan:  Ms. Lameka Disla  is a 43 y.o.  year old with apparent stage IB3 cervical cancer (based on MRI findings).  She is hemodynamically stable with a fairly stable Hb. If it remains >7 tomorrow, I think it's reasonable to discharge her on oral iron replacement therapy. Discharge will facilitate our bringing her in to the office for endocervical sampling (biopsy) to establish a tissue diagnosis. I discussed with Hoyle Sauer that treatment cannot begin until we have established a tissue diagnosis.  Once tissue diagnosis has been established we will proceed with PET scan and evaluation with radiation oncology and medical oncology.  I discussed with Chloeann that due to the size of her tumor on imaging, surgery with radical hysterectomy would not be curative and she would require additional radiation. I explained that this sequencing of therapy is associated with high rates of local complications such as fistula formation. I explained that the standard of care treatment for cervical cancers >4cm is primary chemoradiation. In her case, provided PET and physical exam confirmed stage I disease, I would advocate for an abbreviated course of brachytherapy and consideration of extrafascial hysterectomy/bso 6 weeks after completing the radiation course.   I discussed this plan with Delilah and together we reviewed her MRI images to evaluate the size and location of the tumor.   HPI: Ms Kenedy Haisley is a 43 year old P2 who is seen in consultation at the request of Dr Kennon Rounds for evaluation of a cervical mass and bleeding.  The patient reports the onset of heavy vaginal bleeding since Fathers' Day 2020. This resulted in syncopal feelings on 07/24/18 which led to her visiting the [facility] ER the same day. A TVUS was performed  which showed a possible endometrial mass.  An Hb was drawn which showed severe acute blood loss anemia of 7.7mg /dL.  She was admitted, received 2 units PRBC (with an appropriate corresponding rise in Hb to 9.7) and then received an MRI on 07/26/18. This showed the mildly enlarged anteverted uterus measures 10.7 x 5.4 x 5.5 cm. There is a 4.7 x 3.4 x 3.0 cm poorly marginated infiltrative mass centered in the anterior left uterine cervix which extends 12:00 to 4:00 in the lower cervix, and extends 12:00 to 8:00 in the upper cervix, demonstrating intermediate T2 signal intensity and enhancement. This mass demonstrates cervical stromal invasion of at least 6 mm with no frank parametrial invasion. Mass appears to invade the lower most portion of the uterine cavity. No evidence of vaginal or bladder invasion. Findings are worrisome for a FIGO stage IB3 cervical carcinoma.  She is an otherwise very healthy woman with no chronic medical disease.  She has never had abdominal surgery.   She had regular pap tests up until a year or two ago. Her only abnormal pap was in pregnancy approximately 20 years ago which resulted in what sounds like either a LEEP or CKC in pregnancy and subsequently normal paps after that time.   She has had 2 prior vaginal deliveries.  She has never been vaccinated against HPV.  Current Meds:  Current Facility-Administered Medications:  .  0.9 %  sodium chloride infusion, , Intravenous, Continuous, Anyanwu, Ugonna A, MD .  alum & mag hydroxide-simeth (MAALOX/MYLANTA) 200-200-20 MG/5ML suspension 30 mL, 30 mL, Oral, Q4H PRN, Anyanwu, Laray Anger  A, MD .  bisacodyl (DULCOLAX) EC tablet 5 mg, 5 mg, Oral, Daily PRN, Anyanwu, Ugonna A, MD .  diphenhydrAMINE (BENADRYL) capsule 25 mg, 25 mg, Oral, Q6H PRN, Anyanwu, Ugonna A, MD, 25 mg at 07/25/18 1236 .  docusate sodium (COLACE) capsule 100 mg, 100 mg, Oral, BID, Anyanwu, Ugonna A, MD, 100 mg at 07/27/18 1100 .  DULoxetine (CYMBALTA) DR  capsule 60 mg, 60 mg, Oral, Daily, Anyanwu, Ugonna A, MD, 60 mg at 07/27/18 1100 .  ferrous sulfate tablet 325 mg, 325 mg, Oral, BID WC, Anyanwu, Ugonna A, MD, 325 mg at 07/27/18 1800 .  gabapentin (NEURONTIN) capsule 100 mg, 100 mg, Oral, TID, Anyanwu, Ugonna A, MD, 100 mg at 07/27/18 1627 .  HYDROmorphone (DILAUDID) injection 1-2 mg, 1-2 mg, Intravenous, Q3H PRN, Anyanwu, Ugonna A, MD .  magnesium citrate solution 1 Bottle, 1 Bottle, Oral, Once PRN, Anyanwu, Ugonna A, MD .  magnesium hydroxide (MILK OF MAGNESIA) suspension 30 mL, 30 mL, Oral, Daily PRN, Anyanwu, Ugonna A, MD .  megestrol (MEGACE) tablet 80 mg, 80 mg, Oral, BID, Anyanwu, Ugonna A, MD, 80 mg at 07/27/18 1100 .  naproxen (NAPROSYN) tablet 500 mg, 500 mg, Oral, BID WC, Anyanwu, Ugonna A, MD, 500 mg at 07/27/18 1800 .  ondansetron (ZOFRAN) tablet 4 mg, 4 mg, Oral, Q6H PRN **OR** ondansetron (ZOFRAN) injection 4 mg, 4 mg, Intravenous, Q6H PRN, Anyanwu, Ugonna A, MD .  traMADol (ULTRAM) tablet 50-100 mg, 50-100 mg, Oral, Q6H PRN, Anyanwu, Ugonna A, MD, 100 mg at 07/27/18 1627 .  tranexamic acid (LYSTEDA) tablet 1,300 mg, 1,300 mg, Oral, TID, Anyanwu, Ugonna A, MD, 1,300 mg at 07/27/18 1631 .  zolpidem (AMBIEN) tablet 5 mg, 5 mg, Oral, QHS PRN, Anyanwu, Sallyanne Havers, MD   Allergy: No Known Allergies  Social Hx:   Social History   Socioeconomic History  . Marital status: Married    Spouse name: Leane Para  . Number of children: 3  . Years of education: 37  . Highest education level: Not on file  Occupational History  . Not on file  Social Needs  . Financial resource strain: Not on file  . Food insecurity    Worry: Not on file    Inability: Not on file  . Transportation needs    Medical: Not on file    Non-medical: Not on file  Tobacco Use  . Smoking status: Former Smoker    Types: Cigarettes    Quit date: 02/15/2018    Years since quitting: 0.4  . Smokeless tobacco: Never Used  Substance and Sexual Activity  . Alcohol use:  Not Currently  . Drug use: Never  . Sexual activity: Yes  Lifestyle  . Physical activity    Days per week: Not on file    Minutes per session: Not on file  . Stress: Not on file  Relationships  . Social Herbalist on phone: Not on file    Gets together: Not on file    Attends religious service: Not on file    Active member of club or organization: Not on file    Attends meetings of clubs or organizations: Not on file    Relationship status: Not on file  . Intimate partner violence    Fear of current or ex partner: Not on file    Emotionally abused: Not on file    Physically abused: Not on file    Forced sexual activity: Not on file  Other Topics Concern  .  Not on file  Social History Narrative   Lives with husband, children   Caffeine ,limited    Past Surgical Hx: History reviewed. No pertinent surgical history.  Past Medical Hx:  Past Medical History:  Diagnosis Date  . Depression with anxiety   . Migraine   . Photophobia of both eyes 04/08/2018    Past Gynecological History:  See HPI No LMP recorded. Patient is postmenopausal.  Family Hx:  Family History  Problem Relation Age of Onset  . Heart disease Paternal Uncle   . Heart disease Paternal Grandfather   . Multiple sclerosis Mother   . Heart disease Father   . Multiple sclerosis Sister     Review of Systems:  Constitutional  Feels fatigued and weak    ENT Normal appearing ears and nares bilaterally Skin/Breast  No rash, sores, jaundice, itching, dryness Cardiovascular  No chest pain, shortness of breath, or edema  Pulmonary  No cough or wheeze.  Gastro Intestinal  No nausea, vomitting, or diarrhoea. No bright red blood per rectum, no abdominal pain, change in bowel movement, or constipation.  Genito Urinary  No frequency, urgency, dysuria, + heavy vaginal bleeding Musculo Skeletal  No myalgia, arthralgia, joint swelling or pain  Neurologic  Word finding difficulties Psychology  No  depression, anxiety, insomnia.   Vitals:  Blood pressure 116/70, pulse 91, temperature 98.4 F (36.9 C), temperature source Oral, resp. rate 18, height 5\' 1"  (1.549 m), weight 126 lb 1.7 oz (57.2 kg), SpO2 99 %.  Physical Exam: WD in NAD Neck  Supple NROM, without any enlargements.  Lymph Node Survey No cervical supraclavicular or inguinal adenopathy Cardiovascular  Pulse normal rate, regularity and rhythm. S1 and S2 normal.  Lungs  Clear to auscultation bilateraly, without wheezes/crackles/rhonchi. Good air movement.  Skin  No rash/lesions/breakdown  Psychiatry  Alert and oriented to person, place, and time  Abdomen  Normoactive bowel sounds, abdomen soft, non-tender and thin without evidence of hernia.  Back No CVA tenderness Genito Urinary  Deferred due to inability to adequately examine in the hospital bed Rectal  deferred Extremities  No bilateral cyanosis, clubbing or edema.   Thereasa Solo, MD  07/27/2018, 7:14 PM

## 2018-07-27 NOTE — Plan of Care (Signed)
  Problem: Clinical Measurements: Goal: Ability to maintain clinical measurements within normal limits will improve Outcome: Progressing   Problem: Clinical Measurements: Goal: Cardiovascular complication will be avoided Outcome: Progressing   

## 2018-07-28 ENCOUNTER — Ambulatory Visit
Admission: RE | Admit: 2018-07-28 | Discharge: 2018-07-28 | Disposition: A | Discharge status: Home / Self Care | Payer: Managed Care, Other (non HMO) | Source: Ambulatory Visit | Attending: Radiation Oncology | Admitting: Radiation Oncology

## 2018-07-28 ENCOUNTER — Telehealth: Payer: Self-pay | Admitting: Oncology

## 2018-07-28 ENCOUNTER — Other Ambulatory Visit: Payer: Self-pay

## 2018-07-28 ENCOUNTER — Inpatient Hospital Stay (HOSPITAL_BASED_OUTPATIENT_CLINIC_OR_DEPARTMENT_OTHER): Payer: Managed Care, Other (non HMO) | Admitting: Gynecologic Oncology

## 2018-07-28 ENCOUNTER — Encounter: Payer: Self-pay | Admitting: Radiation Oncology

## 2018-07-28 ENCOUNTER — Encounter: Payer: Self-pay | Admitting: Oncology

## 2018-07-28 ENCOUNTER — Encounter (HOSPITAL_COMMUNITY): Payer: Self-pay | Admitting: General Practice

## 2018-07-28 ENCOUNTER — Encounter: Payer: Self-pay | Admitting: Gynecologic Oncology

## 2018-07-28 VITALS — BP 114/64 | HR 92 | Temp 97.7°F | Resp 16 | Ht 61.0 in | Wt 125.6 lb

## 2018-07-28 DIAGNOSIS — F418 Other specified anxiety disorders: Secondary | ICD-10-CM | POA: Insufficient documentation

## 2018-07-28 DIAGNOSIS — C538 Malignant neoplasm of overlapping sites of cervix uteri: Secondary | ICD-10-CM

## 2018-07-28 DIAGNOSIS — N888 Other specified noninflammatory disorders of cervix uteri: Secondary | ICD-10-CM

## 2018-07-28 DIAGNOSIS — Z87891 Personal history of nicotine dependence: Secondary | ICD-10-CM | POA: Insufficient documentation

## 2018-07-28 DIAGNOSIS — R1900 Intra-abdominal and pelvic swelling, mass and lump, unspecified site: Secondary | ICD-10-CM

## 2018-07-28 DIAGNOSIS — Z79899 Other long term (current) drug therapy: Secondary | ICD-10-CM | POA: Insufficient documentation

## 2018-07-28 DIAGNOSIS — D61818 Other pancytopenia: Secondary | ICD-10-CM | POA: Insufficient documentation

## 2018-07-28 DIAGNOSIS — R197 Diarrhea, unspecified: Secondary | ICD-10-CM | POA: Insufficient documentation

## 2018-07-28 HISTORY — DX: Intra-abdominal and pelvic swelling, mass and lump, unspecified site: R19.00

## 2018-07-28 LAB — CBC
HCT: 24 % — ABNORMAL LOW (ref 36.0–46.0)
Hemoglobin: 8.1 g/dL — ABNORMAL LOW (ref 12.0–15.0)
MCH: 30.7 pg (ref 26.0–34.0)
MCHC: 33.8 g/dL (ref 30.0–36.0)
MCV: 90.9 fL (ref 80.0–100.0)
Platelets: 268 10*3/uL (ref 150–400)
RBC: 2.64 MIL/uL — ABNORMAL LOW (ref 3.87–5.11)
RDW: 13.4 % (ref 11.5–15.5)
WBC: 9.4 10*3/uL (ref 4.0–10.5)
nRBC: 0 % (ref 0.0–0.2)

## 2018-07-28 LAB — GC/CHLAMYDIA PROBE AMP (~~LOC~~) NOT AT ARMC
Chlamydia: NEGATIVE
Neisseria Gonorrhea: NEGATIVE

## 2018-07-28 MED ORDER — FERROUS SULFATE 325 (65 FE) MG PO TABS: 325.0000 mg | ORAL_TABLET | Freq: Two times a day (BID) | ORAL | 3 refills | Status: DC

## 2018-07-28 MED ORDER — DOCUSATE SODIUM 100 MG PO CAPS: 100.0000 mg | ORAL_CAPSULE | Freq: Two times a day (BID) | ORAL | 1 refills | Status: DC

## 2018-07-28 NOTE — Progress Notes (Signed)
Radiation Oncology         [phone]) [phone] ________________________________  Initial outpatient consultation  Name: Jordan Waters MRN: [id number]  Date: 07/28/2018  DOB: Apr 12, 1975  VE:HMCNOBS, Berna Spare, NP  Everitt Amber, MD   REFERRING PHYSICIAN: Everitt Amber, MD  DIAGNOSIS: The encounter diagnosis was Malignant neoplasm of overlapping sites of cervix [facility]).   Presumptive stage IB-3 cervical cancer  HISTORY OF PRESENT ILLNESS::Jordan Waters is a 43 y.o. female who is seen at the courtesy of Dr. Everitt Amber for an opinion concerning radiation therapy as part of management of patient's presumptive locally advanced cervical cancer.  Patient reports onset of heavy vaginal bleeding in late June of this year.  This resulted in syncopal feelings  for which the patient presented to the [facility] emergency room for evaluation.  A transvaginal ultrasound was performed which revealed a possible endometrial mass.  Hemoglobin at that time showed severe acute blood loss with Hb of 7.7.  Patient was admitted to the hospital and received 2 units of packed red cells with  Hemoglobin rising to 9.7.  Patient then proceeded to undergo MRI of the pelvis which revealed 0.7 x 3.4 x 3.0 poorly marginated infiltrative mass centered in the anterior left uterine cervix.  This lesion demonstrated intermediate T2 signal intensity and enhancement.  There appeared to be cervical stromal invasion but no parametrial extension.  Radiographic findings was consistent with a FIGO stage IB-3 cervical cancer.  She was  seen by Dr. Everitt Amber who recommended discharge as patient was stable.  Earlier today the patient presented to the gynecologic oncology clinic where she underwent a detailed pelvic exam and biopsy.  Exam by Dr. Denman George as documented below:  Vulva/vagina: Normal external female genitalia.   No lesions. No discharge or bleeding.             Bladder/urethra:  No lesions or masses, well supported bladder             Vagina:  well supported             Cervix: ectocervix was friable with mass extruding from os onto anterior and posterior ectocervical surface. Palpably the cervix was barrel shaped, bulky, mobile, no parametrial induration/tumor involvement. Palpable tumor approximately 6cm.             Uterus: Bulky lower uterine segment, mobile, no parametrial involvement or nodularity.             Adnexa: no discrete masses. Rectal  Good tone, no masses no cul de sac nodularity. No palpable parametrial disease.  She proceeded to undergo biopsy of the ectocervical and endocervical region. Hemostasis was obtained with pressure and Monsel solution application.   To expedite initiation of radiation therapy the patient is now seen this afternoon for urgent consultation  PREVIOUS RADIATION THERAPY: No  PAST MEDICAL HISTORY:  has a past medical history of Depression with anxiety, Migraine, Pelvic mass in female (07/28/2018), and Photophobia of both eyes (04/08/2018).    PAST SURGICAL HISTORY: Past Surgical History:  Procedure Laterality Date   WISDOM TOOTH EXTRACTION      FAMILY HISTORY: family history includes Heart disease in her father, paternal grandfather, and paternal uncle; Multiple sclerosis in her mother and sister.  SOCIAL HISTORY:  reports that she quit smoking about 5 months ago. Her smoking use included cigarettes. She has never used smokeless tobacco. She reports previous alcohol use. She reports that she does not use drugs.  ALLERGIES: Patient has no known allergies.  MEDICATIONS:  Current Outpatient Medications  Medication Sig Dispense Refill   Ascorbic Acid (VITAMIN C) 100 MG tablet Take 100 mg by mouth daily.     docusate sodium (COLACE) 100 MG capsule Take 1 capsule (100 mg total) by mouth 2 (two) times daily. 60 capsule 1   DULoxetine (CYMBALTA) 60 MG capsule Take 1 capsule (60 mg total) by mouth daily. 30 capsule 3   ferrous sulfate 325 (65 FE) MG tablet Take 1 tablet (325 mg total) by  mouth 2 (two) times daily with a meal. 60 tablet 3   gabapentin (NEURONTIN) 100 MG capsule Take 1 capsule (100 mg total) by mouth 3 (three) times daily. 270 capsule 0   Multiple Vitamin (MULTIVITAMIN WITH MINERALS) TABS tablet Take 1 tablet by mouth daily.     naproxen sodium (ALEVE) 220 MG tablet Take 220 mg by mouth daily as needed.     No current facility-administered medications for this encounter.     REVIEW OF SYSTEMS:  REVIEW OF SYSTEMS: A 10+ POINT REVIEW OF SYSTEMS WAS OBTAINED including neurology, dermatology, psychiatry, cardiac, respiratory, lymph, extremities, GI, GU, musculoskeletal, constitutional, reproductive, HEENT. All pertinent positives are noted in the HPI. All others are negative.  Patient denies any significant pelvic pain.  She does complain of bloated feeling in the lower abdominal region.  No reports of low back or flank pain.  She denies any cough shortness of breath or breathing difficulties.  She has had some headaches recently with stress of the overall situation and occasional syncopal feelings.  she reports approximately 15 pound weight gain over the past few months related to COVID-19 issues  PHYSICAL EXAM:  height is 5\' 1"  (1.549 m) and weight is 125 lb 9.6 oz (57 kg). Her oral temperature is 97.7 F (36.5 C). Her blood pressure is 114/64 and her pulse is 92. Her respiration is 16 and oxygen saturation is 100%.   General: Alert and oriented, in no acute distress HEENT: Head is normocephalic. Extraocular movements are intact. Oropharynx is clear. Neck: Neck is supple, no palpable cervical or supraclavicular lymphadenopathy. Heart: Regular in rate and rhythm with no murmurs, rubs, or gallops. Chest: Clear to auscultation bilaterally, with no rhonchi, wheezes, or rales. Abdomen: Soft, nontender, nondistended, with no rigidity or guarding. Extremities: No cyanosis or edema. Lymphatics: see Neck Exam Skin: No concerning lesions. Musculoskeletal: symmetric  strength and muscle tone throughout. Neurologic: Cranial nerves II through XII are grossly intact. No obvious focalities. Speech is fluent. Coordination is intact. Psychiatric: Judgment and insight are intact. Affect is appropriate. Pelvic exam not performed today in light of exam earlier this afternoon documented as above and recent biopsy.    ECOG = 1    LABORATORY DATA:  Lab Results  Component Value Date   WBC 9.4 07/28/2018   HGB 8.1 (L) 07/28/2018   HCT 24.0 (L) 07/28/2018   MCV 90.9 07/28/2018   PLT 268 07/28/2018   NEUTROABS 3.6 07/26/2018   Lab Results  Component Value Date   NA 138 07/26/2018   K 3.5 07/26/2018   CL 107 07/26/2018   CO2 23 07/26/2018   GLUCOSE 100 (H) 07/26/2018   CREATININE 0.71 07/26/2018   CALCIUM 8.5 (L) 07/26/2018      RADIOGRAPHY: Mr Pelvis W Wo Contrast  Result Date: 07/26/2018 CLINICAL DATA:  43 year old female inpatient with abnormal uterine bleeding with abnormal pelvic ultrasound. EXAM: MRI PELVIS WITHOUT AND WITH CONTRAST TECHNIQUE: Multiplanar multisequence MR imaging of the pelvis was performed both before and after administration  of intravenous contrast. CONTRAST:  5.6 cc Gadavist IV. COMPARISON:  07/25/2018 pelvic sonogram. FINDINGS: Urinary Tract:  Normal bladder.  Normal urethra. Bowel: Visualized small and large bowel are normal caliber with no bowel wall thickening. Vascular/Lymphatic: No pathologically enlarged lymph nodes in the pelvis. No acute vascular abnormality. Reproductive: Uterus: The mildly enlarged anteverted uterus measures 10.7 x 5.4 x 5.5 cm. No uterine fibroids. Inner myometrium (junctional zone) measures 15 mm in thickness, abnormally thickened, compatible with diffuse uterine adenomyosis. Endometrium measures 9 mm in bilayer thickness. There is a 4.7 x 3.4 x 3.0 cm poorly marginated infiltrative mass centered in the anterior left uterine cervix (series 2/image 11, series 4/image 18, series 21/image 58), which extends  12:00 to 4:00 in the lower cervix, and extends 12:00 to 8:00 in the upper cervix, demonstrating intermediate T2 signal intensity and enhancement. This mass demonstrates cervical stromal invasion of at least 6 mm (series 3/image 19), with no frank parametrial invasion. Mass appears to invade the lower most portion of the uterine cavity. No evidence of vaginal or bladder invasion. Findings are worrisome for a FIGO stage IB3 cervical carcinoma. Ovaries and Adnexa: The right ovary measures 3.1 x 2.0 x 1.1 cm and is normal. The left ovary measures 3.5 x 2.7 x 2.9 cm and contains a simple 2.8 x 2.1 x 2.1 cm cyst. There are no suspicious ovarian or adnexal masses. Other: No abnormal free fluid in the pelvis. No focal pelvic fluid collection. Musculoskeletal: No aggressive appearing focal osseous lesions. IMPRESSION: 1. Poorly marginated/infiltrative 4.7 x 3.4 x 3.0 cm mass of the left uterine cervix with invasion of the cervical stroma, worrisome for FIGO stage IB3 cervical carcinoma. No frank parametrial, bladder or vaginal involvement. GYN ONC consultation suggested. 2. No pelvic adenopathy. 3. No suspicious ovarian or adnexal findings. Electronically Signed   By: Ilona Sorrel M.D.   On: 07/26/2018 18:01   US Pelvic Complete With Transvaginal  Result Date: 07/25/2018 CLINICAL DATA:  Abnormal uterine bleeding for a month. EXAM: TRANSABDOMINAL AND TRANSVAGINAL ULTRASOUND OF PELVIS TECHNIQUE: Both transabdominal and transvaginal ultrasound examinations of the pelvis were performed. Transabdominal technique was performed for global imaging of the pelvis including uterus, ovaries, adnexal regions, and pelvic cul-de-sac. It was necessary to proceed with endovaginal exam following the transabdominal exam to visualize the endometrium and ovaries. COMPARISON:  None FINDINGS: Uterus Measurements: 9.4 x 4.2 x 5.7 cm = volume: 118.4 mL. No fibroids or other mass visualized. Endometrium The endometrium measures 3.8 mm in thickness  at the fundus. However, there is a masslike appearance to the endometrium in the uterine body and lower uterine segment measuring 3.2 by 7.9 x 2.8 cm with internal blood flow. There is also fluid in the endometrial canal, likely blood given history. Right ovary Measurements: 2.5 x 1.2 x 1.3 cm = volume: 2 mL. Normal appearance/no adnexal mass. Left ovary Measurements: 3.5 x 2 x 3.1 cm = volume: 11.2 mL. Contains a 2.6 cm follicle. Other findings There is a small amount of free fluid in the pelvis, likely physiologic. IMPRESSION: 1. The endometrium has a masslike appearance in the uterine body and lower uterine segment measuring 3.2 x 7.9 x 2.8 cm. There is also fluid in the endometrial canal, likely blood products given history. The constellation of findings are concerning for an endometrial neoplasm. Recommend gynecologic consultation with endometrial biopsy. 2. No other abnormalities. Electronically Signed   By: Dorise Bullion III M.D   On: 07/25/2018 18:13  IMPRESSION: Clinical and radiographic stage IB-3 cervical cancer.  Biopsy results are pending at this time.  Given the patient's significant bleeding I would agree with Dr. Serita Grit recommendations for urgent radiation therapy directed at the pelvis.  Discussed with patient that we do not have a tissue diagnosis of cervical cancer but impression is this is likely the case.  Given the patient's symptoms she is willing to proceed with radiation therapy without tissue confirmation.  I discussed the general course of radiation treatment side effects and potential toxicities of high dose radiation therapy. She appears to understand and wishes to proceed with planned course of radiation treatment.  We discussed that her radiation therapy will be a combination of external beam and brachii therapy treatments.  If she has a good response to her external beam radiation therapy then she may be a candidate for an abbreviated course of brachii therapy  followed by extrafascial hysterectomy.  This approach would be to potentially limit complications related to high-dose radiation therapy for locally advanced cervical cancer associated with 5 HDR radiation treatments.  PLAN: Patient will return tomorrow morning for CT simulation with treatments to begin July 9.  She will receive 2 radiation treatments prior to the weekend but if she has significant hemorrhaging then will consider radiation therapy over the weekend.  Patient will be seen in the near future by medical oncology.  The patient will be a good candidate for radiosensitizing chemotherapy as a component of her treatment.  She will also proceed with a PET scan to complete staging work-up and overall treatment plan could change somewhat depending on the results of her PET scan.    ------------------------------------------------  Blair Promise, PhD, MD

## 2018-07-28 NOTE — Addendum Note (Signed)
Addended by: Jacqulyn Liner on: 07/28/2018 03:32 PM   Modules accepted: Orders

## 2018-07-28 NOTE — Progress Notes (Signed)
GYN Location of Tumor / Histology: Cervical  [name] presented with symptoms of: bleeding. Heavy vaginal bleeding since Father's Day.  Biopsies revealed: Pending  Past/Anticipated interventions by Gyn/Onc surgery, if any: Per Dr. Denman George 07/27/18:  Discharge will facilitate our bringing her in to the office for endocervical sampling (biopsy) to establish a tissue diagnosis. I discussed with Hoyle Sauer that treatment cannot begin until we have established a tissue diagnosis.  Once tissue diagnosis has been established we will proceed with PET scan and evaluation with radiation oncology and medical oncology.  I discussed with Keiyana that due to the size of her tumor on imaging, surgery with radical hysterectomy would not be curative and she would require additional radiation. I explained that this sequencing of therapy is associated with high rates of local complications such as fistula formation. I explained that the standard of care treatment for cervical cancers >4cm is primary chemoradiation. In her case, provided PET and physical exam confirmed stage I disease, I would advocate for an abbreviated course of brachytherapy and consideration of extrafascial hysterectomy/bso 6 weeks after completing the radiation course.   I discussed this plan with Valrie and together we reviewed her MRI images to evaluate the size and location of the tumor.  Past/Anticipated interventions by medical oncology, if any: initial consult with Dr. Alvy Bimler 08/05/18  Weight changes, if any:  Wt Readings from Last 3 Encounters:  07/28/18 125 lb 9.6 oz (57 kg)  07/25/18 126 lb 1.7 oz (57.2 kg)  07/26/18 126 lb (57.2 kg)     Bowel/Bladder complaints, if any: Denies dysuria/hematuria. Pt reports profuse vaginal bleeding with large clots. Pt denies diarrhea/constipation  Nausea/Vomiting, if any: Pt reports abdominal distension associated with large pelvic mass. Pt reports some nausea without vomiting.   Pain  issues, if any:  Pt reports a sensation of pressure "like right before you deliver".   SAFETY ISSUES:  Prior radiation? No  Pacemaker/ICD? No  Possible current pregnancy? No, spouse s/p vasectomy  Is the patient on methotrexate? No  Current Complaints / other details:  Pt presents today for consult with Dr. Sondra Come for Radiation Oncology.  Loma Sousa, RN BSN

## 2018-07-28 NOTE — Patient Instructions (Signed)
Coronavirus (COVID-19) Are you at risk?  Are you at risk for the Coronavirus (COVID-19)?  To be considered HIGH RISK for Coronavirus (COVID-19), you have to meet the following criteria:  . Traveled to [place], [place], [place], [place] or [place]; or in the [place] to [place], [place], [place], or [place]; and have fever, cough, and shortness of breath within the last 2 weeks of travel OR . Been in close contact with a person diagnosed with COVID-19 within the last 2 weeks and have fever, cough, and shortness of breath . IF YOU DO NOT MEET THESE CRITERIA, YOU ARE CONSIDERED LOW RISK FOR COVID-19.  What to do if you are HIGH RISK for COVID-19?  . If you are having a medical emergency, call 911. . Seek medical care right away. Before you go to a doctor's office, urgent care or emergency department, call ahead and tell them about your recent travel, contact with someone diagnosed with COVID-19, and your symptoms. You should receive instructions from your physician's office regarding next steps of care.  . When you arrive at healthcare provider, tell the healthcare staff immediately you have returned from visiting [place], [place], [place], [place] or [place]; or traveled in the [place] to [place], [place], [place], or [place]; in the last two weeks or you have been in close contact with a person diagnosed with COVID-19 in the last 2 weeks.   . Tell the health care staff about your symptoms: fever, cough and shortness of breath. . After you have been seen by a medical provider, you will be either: o Tested for (COVID-19) and discharged home on quarantine except to seek medical care if symptoms worsen, and asked to  - Stay home and avoid contact with others until you get your results (4-5 days)  - Avoid travel on public transportation if possible (such as bus, train, or airplane) or o Sent to the Emergency Department by EMS for evaluation, COVID-19 testing, and possible  admission depending on your condition and test results.  What to do if you are LOW RISK for COVID-19?  Reduce your risk of any infection by using the same precautions used for avoiding the common cold or flu:  . Wash your hands often with soap and warm water for at least 20 seconds.  If soap and water are not readily available, use an alcohol-based hand sanitizer with at least 60% alcohol.  . If coughing or sneezing, cover your mouth and nose by coughing or sneezing into the elbow areas of your shirt or coat, into a tissue or into your sleeve (not your hands). . Avoid shaking hands with others and consider head nods or verbal greetings only. . Avoid touching your eyes, nose, or mouth with unwashed hands.  . Avoid close contact with people who are sick. . Avoid places or events with large numbers of people in one location, like concerts or sporting events. . Carefully consider travel plans you have or are making. . If you are planning any travel outside or inside the [place], visit the CDC's Travelers' Health webpage for the latest health notices. . If you have some symptoms but not all symptoms, continue to monitor at home and seek medical attention if your symptoms worsen. . If you are having a medical emergency, call 911.   ADDITIONAL HEALTHCARE OPTIONS FOR PATIENTS  [facility] / e-Visit: [email or web address]         [facility]: [phone]  [facility]: [phone]                   [facility]: [phone]   

## 2018-07-28 NOTE — Progress Notes (Signed)
Met with Jordan Waters after appointment with Dr. Denman George.  Advised her of appointments with Dr. Sondra Come and Dr. Alvy Bimler. Gave her the [employer] with all our contact information.  Advised her to call with any questions.

## 2018-07-28 NOTE — Discharge Summary (Signed)
Physician Discharge Summary  Patient ID: Jordan Waters MRN: [id number] DOB/AGE: [birth year] 43 y.o.  Admit date: 07/25/2018 Discharge date: 07/28/2018  Admission Diagnoses: symptomatic anemia  Discharge Diagnoses:  Principal Problem:   Symptomatic anemia Active Problems:   Abnormal uterine bleeding (AUB)   Cervical mass  Discharged Condition: fair  Hospital Course: Please see HPI dated 07/25/2018 for full details. Briefly, this is a 43 y.o. [id number] female admitted for symptomatic anemia. She received a blood transfusion with appropriate rise in H/H. She was found to have pelvic mass on TVUS which showed to be a likely cervical carcinoma on CT. She was seen by Gyn Oncology who recommended outpatient biopsy as long as her H/H remained stable. She continues to have some bleeding but H/H relatively stable so she was discharged HD#3. She reports a headache today and some fullness in her abdomen but is otherwise doing well, no further complaints. She was discharged home with f/u in Dr. Serita Grit office this afternoon and f/u with [facility] as needed. Answered all questions.   Physical Exam:  BP 113/61 (BP Location: Right Arm)   Pulse 89   Temp 98.4 F (36.9 C) (Oral)   Resp 18   Ht 5\' 1"  (1.549 m)   Wt 57.2 kg   SpO2 99%   BMI 23.83 kg/m  General: alert, oriented, cooperative Chest: normal respiratory effort Heart: RRR  Abdomen: soft, appropriately tender to palpation  DVT Evaluation: no evidence of DVT Extremities: no edema, no calf tenderness   Labs: Lab Results  Component Value Date   WBC 9.4 07/28/2018   HGB 8.1 (L) 07/28/2018   HCT 24.0 (L) 07/28/2018   MCV 90.9 07/28/2018   PLT 268 07/28/2018   CMP Latest Ref Rng & Units 07/26/2018  Glucose 70 - 99 mg/dL 100(H)  BUN 6 - 20 mg/dL 9  Creatinine 0.44 - 1.00 mg/dL 0.71  Sodium 135 - 145 mmol/L 138  Potassium 3.5 - 5.1 mmol/L 3.5  Chloride 98 - 111 mmol/L 107  CO2 22 - 32 mmol/L 23  Calcium 8.9 - 10.3 mg/dL 8.5(L)  Total  Protein 6.0 - 8.5 g/dL -  Total Bilirubin 0.0 - 1.2 mg/dL -  Alkaline Phos 39 - 117 IU/L -  AST 0 - 40 IU/L -  ALT 0 - 32 IU/L -   Disposition: Discharge disposition: 01-Home or Self Care      Discharge Instructions    Call MD for:  difficulty breathing, headache or visual disturbances   Complete by: As directed    Call MD for:  extreme fatigue   Complete by: As directed    Call MD for:  hives   Complete by: As directed    Call MD for:  persistant dizziness or light-headedness   Complete by: As directed    Call MD for:  persistant nausea and vomiting   Complete by: As directed    Call MD for:  redness, tenderness, or signs of infection (pain, swelling, redness, odor or green/yellow discharge around incision site)   Complete by: As directed    Call MD for:  severe uncontrolled pain   Complete by: As directed    Call MD for:  temperature >100.4   Complete by: As directed    Diet - low sodium heart healthy   Complete by: As directed    Discharge wound care:   Complete by: As directed    Wash with warm, soapy water. Pat dry, do not scrub.   Increase activity slowly  Complete by: As directed    May shower / Bathe   Complete by: As directed      An After Visit Summary was printed and given to the patient. Allergies as of 07/28/2018   No Known Allergies     Medication List    TAKE these medications   docusate sodium 100 MG capsule Commonly known as: COLACE Take 1 capsule (100 mg total) by mouth 2 (two) times daily.   DULoxetine 60 MG capsule Commonly known as: Cymbalta Take 1 capsule (60 mg total) by mouth daily.   ferrous sulfate 325 (65 FE) MG tablet Take 1 tablet (325 mg total) by mouth 2 (two) times daily with a meal.   gabapentin 100 MG capsule Commonly known as: NEURONTIN Take 1 capsule (100 mg total) by mouth 3 (three) times daily.   multivitamin with minerals Tabs tablet Take 1 tablet by mouth daily.   vitamin C 100 MG tablet Take 100 mg by mouth  daily.            Discharge Care Instructions  (From admission, onward)         Start     Ordered   07/28/18 0000  Discharge wound care:    Comments: Wash with warm, soapy water. Pat dry, do not scrub.   07/28/18 0934         Follow-up Information    Everitt Amber, MD. Call.   Specialty: Gynecologic Oncology Contact information: [place] [address] [phone]        [facility] Follow up.   Specialty: Obstetrics and Gynecology Contact information: [address], [id number] [place] [id number] [phone]          Signed: Sloan Leiter 07/28/2018, 9:50 AM

## 2018-07-28 NOTE — Progress Notes (Signed)
Follow-up Note: Gyn-Onc  Consult was initially requested by Dr. Penny Waters and Dr Jordan Waters for the evaluation of Jordan Waters 43 y.o. female  CC:  Chief Complaint  Patient presents with  . Cervical mass    Assessment/Plan:  [name]  is a 43 y.o.  year old with apparent stage IB3 cervical cancer.  Biopsies taken today and sent rush. Recommendation is for chemoradiation followed by extrafascial hysterectomy.  She will have a PET to look for metastatic disease.  Will have her seen by radiation oncology and medical oncology for treatment planning.   HPI: [name] is a 43 year old P2 who is seen in consultation at the request of Dr Jordan Waters for evaluation of a cervical mass and bleeding.  The patient reports the onset of heavy vaginal bleeding since Fathers' Day 2020. This resulted in syncopal feelings on 07/24/18 which led to her visiting the [facility] ER the same day. A TVUS was performed which showed a possible endometrial mass.  An Hb was drawn which showed severe acute blood loss anemia of 7.7mg /dL.  She was admitted, received 2 units PRBC (with an appropriate corresponding rise in Hb to 9.7) and then received an MRI on 07/26/18. This showed the mildly enlarged anteverted uterus measures 10.7 x 5.4 x 5.5 cm. There is a 4.7 x 3.4 x 3.0 cm poorly marginated infiltrative mass centered in the anterior left uterine cervix which extends 12:00 to 4:00 in the lower cervix, and extends 12:00 to 8:00 in the upper cervix, demonstrating intermediate T2 signal intensity and enhancement. This mass demonstrates cervical stromal invasion of at least 6 mm with no frank parametrial invasion. Mass appears to invade the lower most portion of the uterine cavity. No evidence of vaginal or bladder invasion. Findings are worrisome for a FIGO stage IB3 cervical carcinoma.  She is an otherwise very healthy woman with no chronic medical disease.  She has never had abdominal surgery.   She had regular  pap tests up until a year or two ago. Her only abnormal pap was in pregnancy approximately 20 years ago which resulted in what sounds like either a LEEP or CKC in pregnancy and subsequently normal paps after that time.   She has had 2 prior vaginal deliveries.  She has never been vaccinated against HPV.   Interval History: she was discharged from hospital on 07/27/18. Her bleeding has been slightly less today but she has had some abdominal distension. She reports daily bowel movements.   Current Meds:  Outpatient Encounter Medications as of 07/28/2018  Medication Sig  . Ascorbic Acid (VITAMIN C) 100 MG tablet Take 100 mg by mouth daily.  Marland Kitchen docusate sodium (COLACE) 100 MG capsule Take 1 capsule (100 mg total) by mouth 2 (two) times daily.  . DULoxetine (CYMBALTA) 60 MG capsule Take 1 capsule (60 mg total) by mouth daily.  . ferrous sulfate 325 (65 FE) MG tablet Take 1 tablet (325 mg total) by mouth 2 (two) times daily with a meal.  . gabapentin (NEURONTIN) 100 MG capsule Take 1 capsule (100 mg total) by mouth 3 (three) times daily.  . Multiple Vitamin (MULTIVITAMIN WITH MINERALS) TABS tablet Take 1 tablet by mouth daily.  . naproxen sodium (ALEVE) 220 MG tablet Take 220 mg by mouth daily as needed.  . [DISCONTINUED] 0.9 %  sodium chloride infusion   . [DISCONTINUED] alum & mag hydroxide-simeth (MAALOX/MYLANTA) 200-200-20 MG/5ML suspension 30 mL   . [DISCONTINUED] bisacodyl (DULCOLAX) EC tablet 5 mg   . [  DISCONTINUED] diphenhydrAMINE (BENADRYL) capsule 25 mg   . [DISCONTINUED] docusate sodium (COLACE) capsule 100 mg   . [DISCONTINUED] DULoxetine (CYMBALTA) DR capsule 60 mg   . [DISCONTINUED] ferrous sulfate tablet 325 mg   . [DISCONTINUED] gabapentin (NEURONTIN) capsule 100 mg   . [DISCONTINUED] HYDROmorphone (DILAUDID) injection 1-2 mg   . [DISCONTINUED] magnesium citrate solution 1 Bottle   . [DISCONTINUED] magnesium hydroxide (MILK OF MAGNESIA) suspension 30 mL   . [DISCONTINUED] megestrol  (MEGACE) tablet 80 mg   . [DISCONTINUED] naproxen (NAPROSYN) tablet 500 mg   . [DISCONTINUED] ondansetron (ZOFRAN) injection 4 mg   . [DISCONTINUED] ondansetron (ZOFRAN) tablet 4 mg   . [DISCONTINUED] traMADol (ULTRAM) tablet 50-100 mg   . [DISCONTINUED] tranexamic acid (LYSTEDA) tablet 1,300 mg   . [DISCONTINUED] zolpidem (AMBIEN) tablet 5 mg    No facility-administered encounter medications on file as of 07/28/2018.     Allergy: No Known Allergies  Social Hx:   Social History   Socioeconomic History  . Marital status: Married    Spouse name: Jordan Waters  . Number of children: 3  . Years of education: 10  . Highest education level: Not on file  Occupational History  . Not on file  Social Needs  . Financial resource strain: Not on file  . Food insecurity    Worry: Not on file    Inability: Not on file  . Transportation needs    Medical: Not on file    Non-medical: Not on file  Tobacco Use  . Smoking status: Former Smoker    Types: Cigarettes    Quit date: 02/15/2018    Years since quitting: 0.4  . Smokeless tobacco: Never Used  Substance and Sexual Activity  . Alcohol use: Not Currently  . Drug use: Never  . Sexual activity: Yes  Lifestyle  . Physical activity    Days per week: Not on file    Minutes per session: Not on file  . Stress: Not on file  Relationships  . Social Herbalist on phone: Not on file    Gets together: Not on file    Attends religious service: Not on file    Active member of club or organization: Not on file    Attends meetings of clubs or organizations: Not on file    Relationship status: Not on file  . Intimate partner violence    Fear of current or ex partner: Not on file    Emotionally abused: Not on file    Physically abused: Not on file    Forced sexual activity: Not on file  Other Topics Concern  . Not on file  Social History Narrative   Lives with husband, children   Caffeine ,limited    Past Surgical Hx:  Past Surgical  History:  Procedure Laterality Date  . WISDOM TOOTH EXTRACTION      Past Medical Hx:  Past Medical History:  Diagnosis Date  . Depression with anxiety   . Migraine   . Pelvic mass in female 07/28/2018  . Photophobia of both eyes 04/08/2018    Past Gynecological History:  See HPI  No LMP recorded. Patient is postmenopausal.  Family Hx:  Family History  Problem Relation Age of Onset  . Heart disease Paternal Uncle   . Heart disease Paternal Grandfather   . Multiple sclerosis Mother   . Heart disease Father   . Multiple sclerosis Sister     Review of Systems:  Constitutional  Feels fatigued  ENT Normal appearing ears and nares bilaterally Skin/Breast  No rash, sores, jaundice, itching, dryness Cardiovascular  No chest pain, shortness of breath, or edema  Pulmonary  No cough or wheeze.  Gastro Intestinal  No nausea, vomitting, or diarrhoea. No bright red blood per rectum, no abdominal pain, change in bowel movement, or constipation.  Genito Urinary  No frequency, urgency, dysuria, + bleeding Musculo Skeletal  No myalgia, arthralgia, joint swelling or pain  Neurologic  No weakness, numbness, change in gait,  Psychology  No depression, anxiety, insomnia.   Vitals:  Blood pressure 114/64, pulse 92, temperature 97.7 F (36.5 C), temperature source Oral, resp. rate 16, height 5\' 1"  (1.549 m), weight 125 lb 9.6 oz (57 kg), SpO2 100 %.  Physical Exam: WD in NAD Neck  Supple NROM, without any enlargements.  Lymph Node Survey No cervical supraclavicular or inguinal adenopathy Cardiovascular  Pulse normal rate, regularity and rhythm. S1 and S2 normal.  Lungs  Clear to auscultation bilateraly, without wheezes/crackles/rhonchi. Good air movement.  Skin  No rash/lesions/breakdown  Psychiatry  Alert and oriented to person, place, and time  Abdomen  Normoactive bowel sounds, abdomen soft, non-tender and thin without evidence of hernia.  Back No CVA tenderness Genito  Urinary  Vulva/vagina: Normal external female genitalia.   No lesions. No discharge or bleeding.  Bladder/urethra:  No lesions or masses, well supported bladder  Vagina: well supported  Cervix: ectocervix was friable with mass extruding from os onto anterior and posterior ectocervical surface. Palpably the cervix was barrel shaped, bulky, mobile, no parametrial induration/tumor involvement. Palpably tumor approximately 6cm.  Uterus: Bulky lower uterine segment, mobile, no parametrial involvement or nodularity.  Adnexa: no discrete masses. Rectal  Good tone, no masses no cul de sac nodularity. No palpable parametrial disease. Extremities  No bilateral cyanosis, clubbing or edema.  Procedure Note:  Preop Dx: cervical mass and bleeding Postop Dx: same Procedure: cervical biopsy Surgeon: Dorann Ou, MD EBL: 10cc Specimens: 1/ ectocervical biopsy, 2/endocervical biopsy Complications: none Procedure Details: The patient was placed in lithotomy position.  The cervix was visualized with a speculum.  A tenaculum was placed on the anterior lip of the cervix.  The ectocervical lesions were biopsied in representative areas.  The endocervical curette was inserted into the awes and a scraping took place of the endocervical tissue which was very friable.  Hemostasis was obtained with pressure and Monsel solution.  The patient tolerated the procedure well and the specimen was sent for histopathology.  Thereasa Solo, MD  07/28/2018, 2:47 PM

## 2018-07-28 NOTE — Progress Notes (Signed)
Pt has significant abdominal distension this morning,says more distended than it was yesterday. Also mentioned that she feels like she is having difficulty urinating. Bladder scanned with 250 cc post void residual recorded

## 2018-07-28 NOTE — Progress Notes (Signed)
Discharged home today. Discharged instructions,personal belongings given to patient. Will see Dr. Denman George at the clinic this afternoon. Verbalized understanding of instructions

## 2018-07-28 NOTE — Telephone Encounter (Signed)
Requested a rush on cervical biopsy with Maudie Mercury in [facility].

## 2018-07-28 NOTE — Patient Instructions (Signed)
Plan to have a PET scan (nothing to eat or drink except for water six hours before your test).  We will also arrange for you to meet with Dr. Gery Pray in Radiation and Dr. Heath Lark with Medical Oncology.  Dr. Denman George will see you when you complete radiation for a discussion for possible surgery at that time.  Cervical Biopsy, Care After  This sheet gives you information about how to care for yourself after your procedure. Your health care provider may also give you more specific instructions. If you have problems or questions, contact your health care provider. What can I expect after the procedure? After the procedure, it is common to have:  Cramping or mild pain for a few days.  Slight bleeding from the vagina for a few days.  Dark-colored vaginal discharge for a few days. Follow these instructions at home: Lifestyle  Do not douche until your health care provider approves.  Do not use tampons until your health care provider approves.  Do not have sex until your health care provider approves.  Return to your normal activities as told by your health care provider. Ask your health care provider what activities are safe for you. General instructions   Take over-the-counter and prescription medicines only as told by your health care provider.  Use a sanitary napkin until bleeding and discharge stop.  Keep all follow-up visits as told by your health care provider. This is important. Contact a health care provider if you have:  A fever or chills.  Bad-smelling vaginal discharge.  Itching or irritation around the vagina.  Lower abdominal pain. Get help right away if you:  Develop heavy vaginal bleeding that soaks more than one sanitary napkin an hour.  Faint.  Have severe pain in your lower abdomen. Summary  After the procedure, it is normal to have cramps, slight bleeding, and dark-colored discharge from the vagina.  After you return home, do not douche, have sex, or  use a tampon until your health care provider approves.  Take over-the-counter and prescription medicines only as told by your health care provider.  Get help right away if you develop heavy bleeding, you faint, or you have severe pain in your lower abdomen. This information is not intended to replace advice given to you by your health care provider. Make sure you discuss any questions you have with your health care provider. Document Released: 09/28/2014 Document Revised: 06/12/2017 Document Reviewed: 06/12/2017 Elsevier Patient Education  2020 [employer].

## 2018-07-29 ENCOUNTER — Other Ambulatory Visit: Payer: Self-pay | Admitting: Neurology

## 2018-07-29 ENCOUNTER — Other Ambulatory Visit: Payer: Self-pay

## 2018-07-29 ENCOUNTER — Ambulatory Visit
Admission: RE | Admit: 2018-07-29 | Discharge: 2018-07-29 | Disposition: A | Discharge status: Home / Self Care | Payer: Managed Care, Other (non HMO) | Source: Ambulatory Visit | Attending: Radiation Oncology | Admitting: Radiation Oncology

## 2018-07-29 DIAGNOSIS — Z510 Encounter for antineoplastic radiation therapy: Secondary | ICD-10-CM | POA: Insufficient documentation

## 2018-07-29 DIAGNOSIS — C538 Malignant neoplasm of overlapping sites of cervix uteri: Secondary | ICD-10-CM | POA: Diagnosis not present

## 2018-07-29 LAB — TYPE AND SCREEN
ABO/RH(D): B POS
Antibody Screen: NEGATIVE
Unit division: 0
Unit division: 0
Unit division: 0

## 2018-07-29 LAB — BPAM RBC
Blood Product Expiration Date: 202007042359
Blood Product Expiration Date: 202007112359
Blood Product Expiration Date: 202008052359
ISSUE DATE / TIME: 202007041123
ISSUE DATE / TIME: 202007042203
Unit Type and Rh: 7300
Unit Type and Rh: 7300
Unit Type and Rh: 9500

## 2018-07-30 ENCOUNTER — Other Ambulatory Visit: Payer: Self-pay | Admitting: Hematology and Oncology

## 2018-07-30 ENCOUNTER — Other Ambulatory Visit: Payer: Self-pay

## 2018-07-30 ENCOUNTER — Telehealth: Payer: Self-pay | Admitting: Oncology

## 2018-07-30 ENCOUNTER — Ambulatory Visit: Admission: RE | Admit: 2018-07-30 | Payer: Managed Care, Other (non HMO) | Source: Ambulatory Visit

## 2018-07-30 DIAGNOSIS — C538 Malignant neoplasm of overlapping sites of cervix uteri: Secondary | ICD-10-CM | POA: Diagnosis not present

## 2018-07-30 NOTE — Telephone Encounter (Signed)
Called Jordan Waters to advise her of porta cath appointment at [facility] on 08/06/18.  She said she had talked to IR at [facility] and had scheduled for 08/11/18 when her husband is off.  Explained that it is important to have the porta cath put in next week so that she could start chemo possibly on Friday.  She said that she would not be able to start chemo on Friday due to her husband's work schedule. She also said that she is waiting for the PET scan results to decide if she will have any treatment.  She had a family meeting and decided that if the cancer has spread anywhere else, she does not want treatment.  Advised her that I will try to move the IR appointment back to 7/21.

## 2018-07-30 NOTE — Progress Notes (Signed)
START OFF PATHWAY REGIMEN - Other   [id number]:Cisplatin 40 mg/m2 IV D1 q7 Days + RT:   A cycle is every 7 days:     Cisplatin   **Always confirm dose/schedule in your pharmacy ordering system**  Patient Characteristics: Intent of Therapy: Curative Intent, Discussed with Patient 

## 2018-07-31 ENCOUNTER — Ambulatory Visit: Payer: Managed Care, Other (non HMO)

## 2018-07-31 ENCOUNTER — Ambulatory Visit
Admission: RE | Admit: 2018-07-31 | Discharge: 2018-07-31 | Disposition: A | Discharge status: Home / Self Care | Payer: Managed Care, Other (non HMO) | Source: Ambulatory Visit | Attending: Radiation Oncology | Admitting: Radiation Oncology

## 2018-07-31 ENCOUNTER — Other Ambulatory Visit: Payer: Self-pay

## 2018-07-31 DIAGNOSIS — C538 Malignant neoplasm of overlapping sites of cervix uteri: Secondary | ICD-10-CM | POA: Insufficient documentation

## 2018-07-31 DIAGNOSIS — R197 Diarrhea, unspecified: Secondary | ICD-10-CM | POA: Insufficient documentation

## 2018-07-31 DIAGNOSIS — Z5111 Encounter for antineoplastic chemotherapy: Secondary | ICD-10-CM | POA: Insufficient documentation

## 2018-07-31 DIAGNOSIS — R112 Nausea with vomiting, unspecified: Secondary | ICD-10-CM | POA: Insufficient documentation

## 2018-07-31 DIAGNOSIS — R531 Weakness: Secondary | ICD-10-CM | POA: Insufficient documentation

## 2018-07-31 DIAGNOSIS — D62 Acute posthemorrhagic anemia: Secondary | ICD-10-CM | POA: Insufficient documentation

## 2018-07-31 DIAGNOSIS — F418 Other specified anxiety disorders: Secondary | ICD-10-CM | POA: Insufficient documentation

## 2018-07-31 DIAGNOSIS — R42 Dizziness and giddiness: Secondary | ICD-10-CM | POA: Insufficient documentation

## 2018-07-31 DIAGNOSIS — Z87891 Personal history of nicotine dependence: Secondary | ICD-10-CM | POA: Insufficient documentation

## 2018-07-31 DIAGNOSIS — R140 Abdominal distension (gaseous): Secondary | ICD-10-CM | POA: Insufficient documentation

## 2018-07-31 DIAGNOSIS — Z79899 Other long term (current) drug therapy: Secondary | ICD-10-CM | POA: Insufficient documentation

## 2018-07-31 DIAGNOSIS — D61818 Other pancytopenia: Secondary | ICD-10-CM | POA: Insufficient documentation

## 2018-07-31 DIAGNOSIS — Z7689 Persons encountering health services in other specified circumstances: Secondary | ICD-10-CM | POA: Insufficient documentation

## 2018-07-31 DIAGNOSIS — Z510 Encounter for antineoplastic radiation therapy: Secondary | ICD-10-CM | POA: Insufficient documentation

## 2018-08-03 ENCOUNTER — Other Ambulatory Visit: Payer: Self-pay

## 2018-08-03 ENCOUNTER — Ambulatory Visit (HOSPITAL_COMMUNITY)
Admission: RE | Admit: 2018-08-03 | Discharge: 2018-08-03 | Disposition: A | Discharge status: Home / Self Care | Payer: Managed Care, Other (non HMO) | Source: Ambulatory Visit | Attending: Gynecologic Oncology | Admitting: Gynecologic Oncology

## 2018-08-03 ENCOUNTER — Ambulatory Visit
Admission: RE | Admit: 2018-08-03 | Discharge: 2018-08-03 | Disposition: A | Discharge status: Home / Self Care | Payer: Managed Care, Other (non HMO) | Source: Ambulatory Visit | Attending: Radiation Oncology | Admitting: Radiation Oncology

## 2018-08-03 ENCOUNTER — Ambulatory Visit: Payer: Managed Care, Other (non HMO) | Admitting: Radiation Oncology

## 2018-08-03 ENCOUNTER — Ambulatory Visit: Payer: Managed Care, Other (non HMO)

## 2018-08-03 DIAGNOSIS — C538 Malignant neoplasm of overlapping sites of cervix uteri: Secondary | ICD-10-CM | POA: Insufficient documentation

## 2018-08-03 LAB — GLUCOSE, CAPILLARY: Glucose-Capillary: 84 mg/dL (ref 70–99)

## 2018-08-03 IMAGING — CT NUCLEAR MEDICINE PET IMAGE INITIAL (PI) SKULL BASE TO THIGH
8 series · 16 of 16 positions shown · non-contrast
Comparison: None.

CLINICAL DATA: Initial treatment strategy for cervical cancer.

EXAM:
NUCLEAR MEDICINE PET SKULL BASE TO THIGH
TECHNIQUE: 6.2 mCi F-18 FDG was injected intravenously. Full-ring PET imaging
was performed from the skull base to thigh after the radiotracer. CT
data was obtained and used for attenuation correction and anatomic
localization.
Fasting blood glucose: 84 mg/dl

[Series 3: pet sk_thigh ac · axial · 5.0mm · 4.07mm/px · z∈[-1420,-540]mm · 3 of 221 slices shown]
[im 1/221]
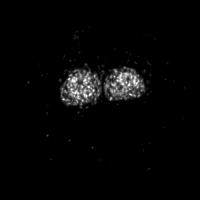
[im 111/221]
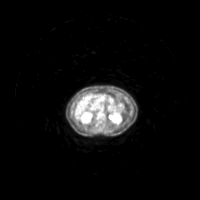
[im 221/221]
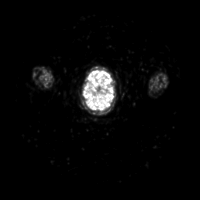

[Series 4: ct sk_thigh 5.0 b31f · axial · 0.90mm/px · z∈[-1420,-540]mm · 3 of 221 slices shown]
[im 1/221  soft-tissue]
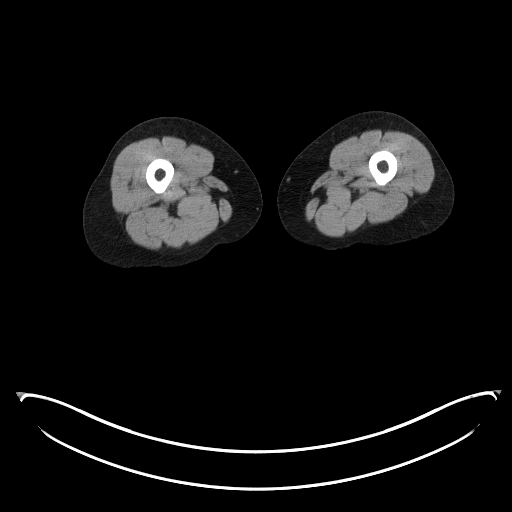
[im 111/221  soft-tissue]
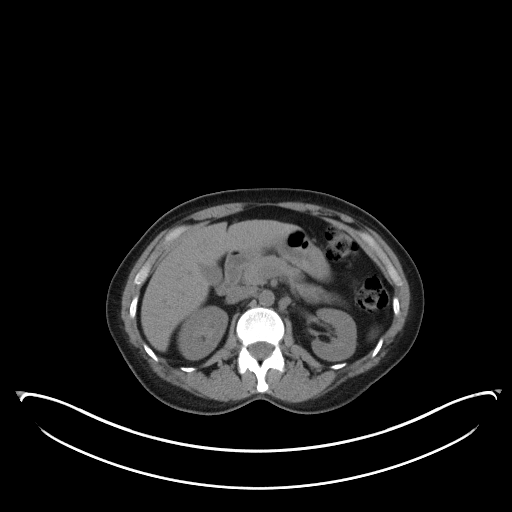
[im 221/221  soft-tissue]
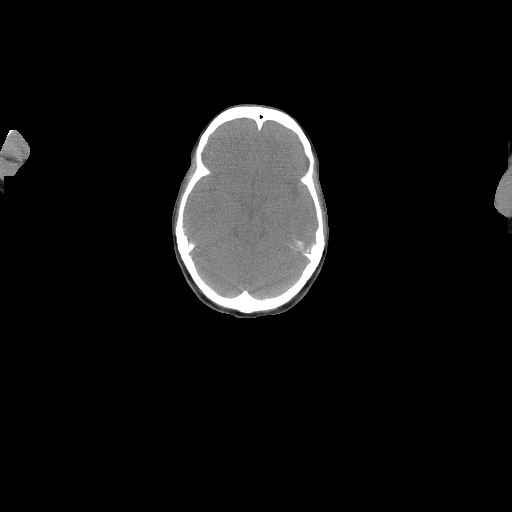

[Series 5: pet sk_thigh nac · axial · 5.0mm · 4.07mm/px · z∈[-1420,-540]mm · 3 of 221 slices shown]
[im 1/221]
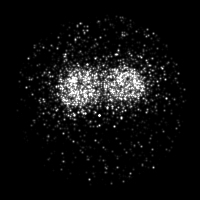
[im 111/221]
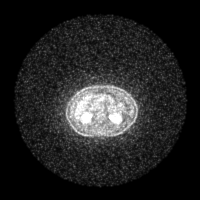
[im 221/221]
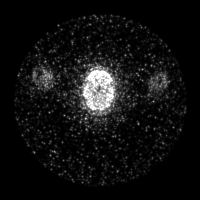

[Series 8: ct sk_thigh 5.0 (id) lung_bone · axial · 0.59mm/px · 1 of 59 slices shown]
[im 1/59  soft-tissue]
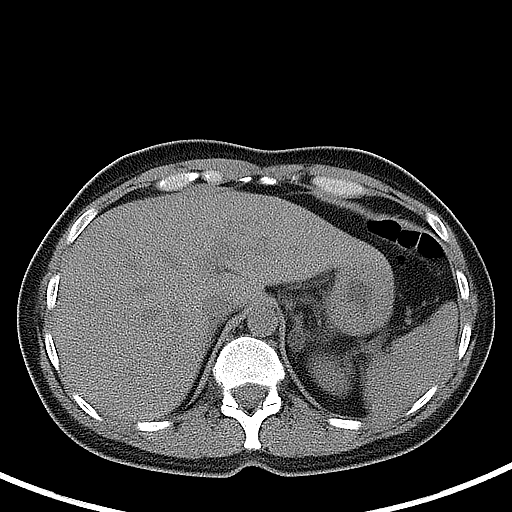

[Series 603: range-ct sk_thigh 5.0 (id)<alpha range> · 1 of 66 slices shown (1 of 2)]
[im 1/66]
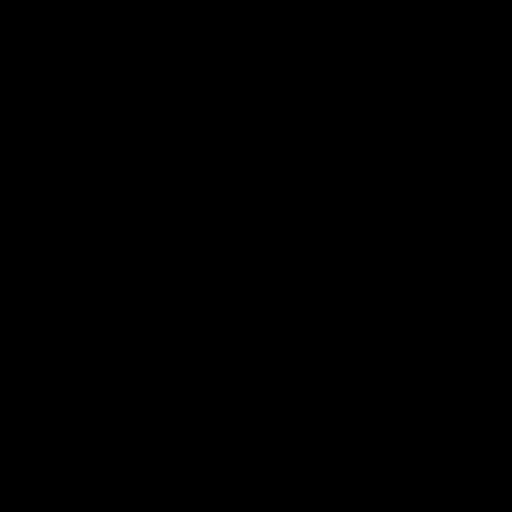

[Series 604: mip range · coronal · 1.83mm/px · 1 of 32 slices shown]
[im 1/32]
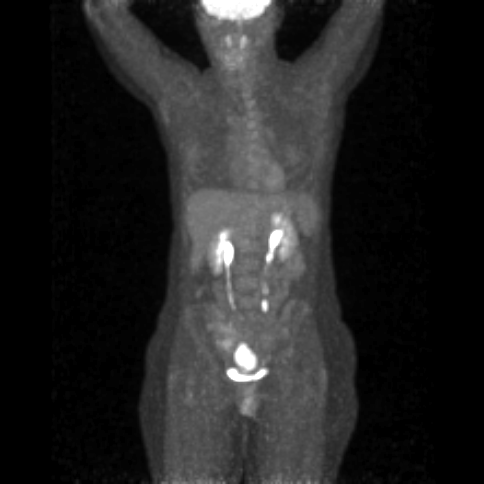

[Series 605: range-ct sk_thigh 5.0 (id)<alpha range> · 3 of 211 slices shown (2 of 2)]
[im 1/211]
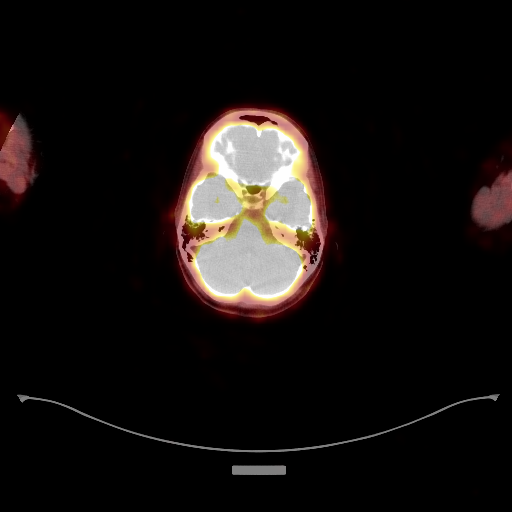
[im 106/211]
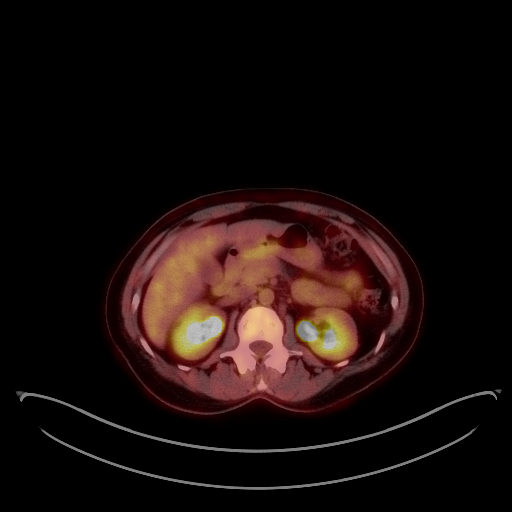
[im 211/211]
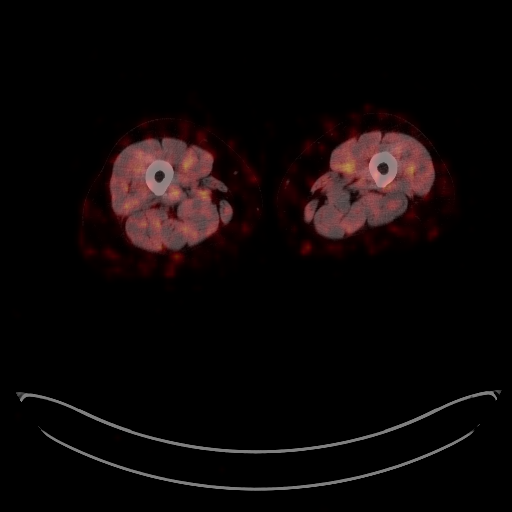

[Series 1083: results mm oncology reading · 4.0mm · 0.82mm/px · 1 of 1 slices shown]
[im 1/1]
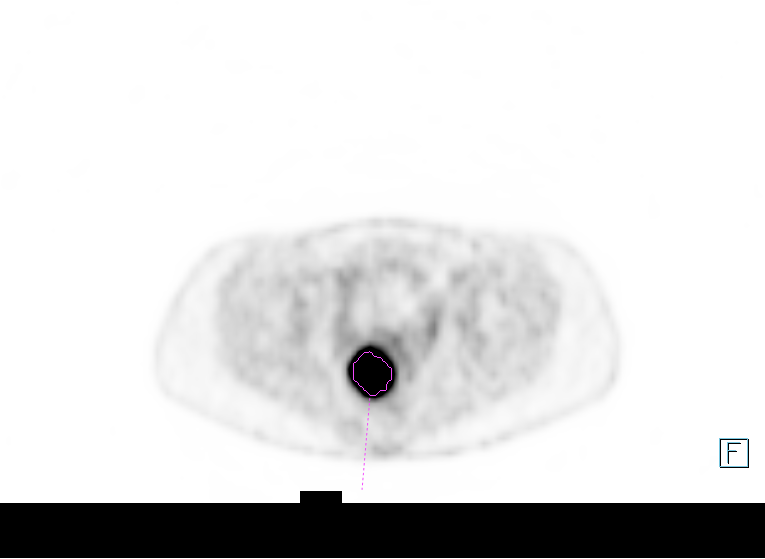

[16 of 16 positions shown; findings below may reference images not displayed]

FINDINGS: Mediastinal blood pool activity: SUV max

Liver activity: SUV max NA

NECK: No hypermetabolic lymph nodes in the neck. Symmetric uptake in
the oropharynx bilaterally is nonspecific.

Incidental CT findings: none

CHEST: No hypermetabolic mediastinal or hilar nodes.2 mm right lower
lobe nodule visible on 33/3. No overtly suspicious nodule or mass.

Incidental CT findings: None.

ABDOMEN/PELVIS: Cervical mass is markedly hypermetabolic with SUV
max = 19.4. No hypermetabolic pelvic sidewall lymphadenopathy. No
evidence for hypermetabolic metastatic disease in the
abdomen/pelvis.

Incidental CT findings: Tiny calcified gallstones evident. There is
abdominal aortic atherosclerosis without aneurysm. Trace
intraperitoneal free fluid evident.

SKELETON: No focal hypermetabolic activity to suggest skeletal
metastasis.

Incidental CT findings: none
IMPRESSION: 1. Hypermetabolic cervical mass compatible with known neoplasm. No
evidence for hypermetabolic metastatic disease in the neck, chest,
abdomen, or pelvis.

## 2018-08-03 MED ORDER — FLUDEOXYGLUCOSE F - 18 (FDG) INJECTION
6.2000 | Freq: Once | INTRAVENOUS | Status: AC | PRN
  Administered 2018-08-03: 6.2 via INTRAVENOUS

## 2018-08-04 ENCOUNTER — Other Ambulatory Visit: Payer: Self-pay

## 2018-08-04 ENCOUNTER — Telehealth: Payer: Self-pay | Admitting: Oncology

## 2018-08-04 ENCOUNTER — Ambulatory Visit
Admission: RE | Admit: 2018-08-04 | Discharge: 2018-08-04 | Disposition: A | Discharge status: Home / Self Care | Payer: Managed Care, Other (non HMO) | Source: Ambulatory Visit | Attending: Radiation Oncology | Admitting: Radiation Oncology

## 2018-08-04 DIAGNOSIS — C538 Malignant neoplasm of overlapping sites of cervix uteri: Secondary | ICD-10-CM | POA: Diagnosis not present

## 2018-08-04 NOTE — Telephone Encounter (Signed)
Called Jordan Waters and advised her of PET results per Dr. Alvy Bimler. Also discussed the Port insertion and she would like to stay with having it put in on 08/11/18.

## 2018-08-05 ENCOUNTER — Inpatient Hospital Stay: Payer: Managed Care, Other (non HMO)

## 2018-08-05 ENCOUNTER — Encounter: Payer: Self-pay | Admitting: Hematology and Oncology

## 2018-08-05 ENCOUNTER — Ambulatory Visit
Admission: RE | Admit: 2018-08-05 | Discharge: 2018-08-05 | Disposition: A | Discharge status: Home / Self Care | Payer: Managed Care, Other (non HMO) | Source: Ambulatory Visit | Attending: Radiation Oncology | Admitting: Radiation Oncology

## 2018-08-05 ENCOUNTER — Inpatient Hospital Stay (HOSPITAL_BASED_OUTPATIENT_CLINIC_OR_DEPARTMENT_OTHER): Payer: Managed Care, Other (non HMO) | Admitting: Hematology and Oncology

## 2018-08-05 ENCOUNTER — Telehealth: Payer: Self-pay | Admitting: Oncology

## 2018-08-05 ENCOUNTER — Other Ambulatory Visit: Payer: Self-pay

## 2018-08-05 ENCOUNTER — Other Ambulatory Visit (HOSPITAL_COMMUNITY): Payer: Managed Care, Other (non HMO)

## 2018-08-05 VITALS — BP 112/65 | HR 96 | Temp 98.9°F | Resp 20 | Ht 61.0 in | Wt 122.2 lb

## 2018-08-05 DIAGNOSIS — F418 Other specified anxiety disorders: Secondary | ICD-10-CM | POA: Diagnosis not present

## 2018-08-05 DIAGNOSIS — D61818 Other pancytopenia: Secondary | ICD-10-CM | POA: Diagnosis not present

## 2018-08-05 DIAGNOSIS — R197 Diarrhea, unspecified: Secondary | ICD-10-CM | POA: Diagnosis not present

## 2018-08-05 DIAGNOSIS — Z79899 Other long term (current) drug therapy: Secondary | ICD-10-CM

## 2018-08-05 DIAGNOSIS — Z87891 Personal history of nicotine dependence: Secondary | ICD-10-CM

## 2018-08-05 DIAGNOSIS — C538 Malignant neoplasm of overlapping sites of cervix uteri: Secondary | ICD-10-CM | POA: Diagnosis not present

## 2018-08-05 DIAGNOSIS — D649 Anemia, unspecified: Secondary | ICD-10-CM

## 2018-08-05 LAB — CBC WITH DIFFERENTIAL/PLATELET
Abs Immature Granulocytes: 0.01 10*3/uL (ref 0.00–0.07)
Basophils Absolute: 0 10*3/uL (ref 0.0–0.1)
Basophils Relative: 1 %
Eosinophils Absolute: 0 10*3/uL (ref 0.0–0.5)
Eosinophils Relative: 1 %
HCT: 29.1 % — ABNORMAL LOW (ref 36.0–46.0)
Hemoglobin: 9 g/dL — ABNORMAL LOW (ref 12.0–15.0)
Immature Granulocytes: 0 %
Lymphocytes Relative: 21 %
Lymphs Abs: 0.8 10*3/uL (ref 0.7–4.0)
MCH: 30.6 pg (ref 26.0–34.0)
MCHC: 30.9 g/dL (ref 30.0–36.0)
MCV: 99 fL (ref 80.0–100.0)
Monocytes Absolute: 0.3 10*3/uL (ref 0.1–1.0)
Monocytes Relative: 8 %
Neutro Abs: 2.6 10*3/uL (ref 1.7–7.7)
Neutrophils Relative %: 69 %
Platelets: 405 10*3/uL — ABNORMAL HIGH (ref 150–400)
RBC: 2.94 MIL/uL — ABNORMAL LOW (ref 3.87–5.11)
RDW: 15.7 % — ABNORMAL HIGH (ref 11.5–15.5)
WBC: 3.8 10*3/uL — ABNORMAL LOW (ref 4.0–10.5)
nRBC: 0 % (ref 0.0–0.2)

## 2018-08-05 LAB — FERRITIN: Ferritin: 36 ng/mL (ref 11–307)

## 2018-08-05 LAB — COMPREHENSIVE METABOLIC PANEL
ALT: 9 U/L (ref 0–44)
AST: 13 U/L — ABNORMAL LOW (ref 15–41)
Albumin: 3.9 g/dL (ref 3.5–5.0)
Alkaline Phosphatase: 51 U/L (ref 38–126)
Anion gap: 8 (ref 5–15)
BUN: 7 mg/dL (ref 6–20)
CO2: 21 mmol/L — ABNORMAL LOW (ref 22–32)
Calcium: 8.5 mg/dL — ABNORMAL LOW (ref 8.9–10.3)
Chloride: 110 mmol/L (ref 98–111)
Creatinine, Ser: 0.69 mg/dL (ref 0.44–1.00)
GFR calc Af Amer: >60 mL/min (ref >60)
GFR calc non Af Amer: >60 mL/min (ref >60)
Glucose, Bld: 75 mg/dL (ref 70–99)
Potassium: 3.9 mmol/L (ref 3.5–5.1)
Sodium: 139 mmol/L (ref 135–145)
Total Bilirubin: 0.3 mg/dL (ref 0.3–1.2)
Total Protein: 6.9 g/dL (ref 6.5–8.1)

## 2018-08-05 LAB — IRON AND TIBC
Iron: 188 ug/dL — ABNORMAL HIGH (ref 41–142)
Saturation Ratios: 54 % (ref 21–57)
TIBC: 349 ug/dL (ref 236–444)
UIBC: 161 ug/dL (ref 120–384)

## 2018-08-05 LAB — SAMPLE TO BLOOD BANK

## 2018-08-05 LAB — MAGNESIUM: Magnesium: 2.1 mg/dL (ref 1.7–2.4)

## 2018-08-05 MED ORDER — PROCHLORPERAZINE MALEATE 10 MG PO TABS: 10.0000 mg | ORAL_TABLET | Freq: Four times a day (QID) | ORAL | 1 refills | Status: DC | PRN

## 2018-08-05 MED ORDER — LIDOCAINE-PRILOCAINE 2.5-2.5 % EX CREA: TOPICAL_CREAM | CUTANEOUS | 3 refills | Status: DC

## 2018-08-05 MED ORDER — ONDANSETRON HCL 8 MG PO TABS: 8.0000 mg | ORAL_TABLET | Freq: Three times a day (TID) | ORAL | 1 refills | Status: DC | PRN

## 2018-08-05 NOTE — Telephone Encounter (Signed)
Left a message advising Jordan Waters that Dr. Alvy Bimler has sent in 3 prescriptions to [facility] that she will need to pick up.  Also advised her that her lab results from today were good and that she does not need to take any extra iron.  Also reminded her not to remove her blood band when she has labs drawn again.  Advised to call back with any questions.

## 2018-08-05 NOTE — Assessment & Plan Note (Signed)
I have reviewed imaging study with the patient Thankfully, she has early stage disease She denies further vaginal bleeding  We discussed the role of chemotherapy. The intent is of curative intent.  We discussed some of the risks, benefits, side-effects of cisplatin and its role as chemo sensitizing agent. The plan for weekly cisplatin for x5 doses along with radiation treatment.  Some of the short term side-effects included, though not limited to, including weight loss, life threatening infections, risk of allergic reactions, need for transfusions of blood products, nausea, vomiting, change in bowel habits, loss of hair, admission to hospital for various reasons, and risks of death.   Long term side-effects are also discussed including risks of infertility, permanent damage to nerve function, hearing loss, chronic fatigue, kidney damage with possibility needing hemodialysis, and rare secondary malignancy including bone marrow disorders.  The patient is aware that the response rates discussed earlier is not guaranteed.  After a long discussion, patient made an informed decision to proceed with the prescribed plan of care.   Patient education material was dispensed. I recommend the patient to start treatment as soon as possible but due to limitation on her behalf, we could not start her treatment until the end of the month I will try to accommodate for her needs to get her chemotherapy on Mondays and her blood work the week prior I will see her weekly for supportive care

## 2018-08-05 NOTE — Progress Notes (Signed)
[place] CONSULT NOTE  Patient Care Team: Danford, Berna Spare, NP as PCP - General (Family Medicine)  ASSESSMENT & PLAN:  Malignant neoplasm of overlapping sites of cervix [facility]) I have reviewed imaging study with the patient Thankfully, she has early stage disease She denies further vaginal bleeding  We discussed the role of chemotherapy. The intent is of curative intent.  We discussed some of the risks, benefits, side-effects of cisplatin and its role as chemo sensitizing agent. The plan for weekly cisplatin for x5 doses along with radiation treatment.  Some of the short term side-effects included, though not limited to, including weight loss, life threatening infections, risk of allergic reactions, need for transfusions of blood products, nausea, vomiting, change in bowel habits, loss of hair, admission to hospital for various reasons, and risks of death.   Long term side-effects are also discussed including risks of infertility, permanent damage to nerve function, hearing loss, chronic fatigue, kidney damage with possibility needing hemodialysis, and rare secondary malignancy including bone marrow disorders.  The patient is aware that the response rates discussed earlier is not guaranteed.  After a long discussion, patient made an informed decision to proceed with the prescribed plan of care.   Patient education material was dispensed. I recommend the patient to start treatment as soon as possible but due to limitation on her behalf, we could not start her treatment until the end of the month I will try to accommodate for her needs to get her chemotherapy on Mondays and her blood work the week prior I will see her weekly for supportive care     Pancytopenia, acquired [facility]) She has mild persistent pancytopenia but improved She does not need transfusion support   Orders Placed This Encounter  Procedures  . CBC with Differential/Platelet    Standing Status:    Future    Number of Occurrences:   1    Standing Expiration Date:   09/09/2019  . Comprehensive metabolic panel    Standing Status:   Future    Number of Occurrences:   1    Standing Expiration Date:   09/09/2019  . Iron and TIBC    Standing Status:   Future    Number of Occurrences:   1    Standing Expiration Date:   09/09/2019  . Ferritin    Standing Status:   Future    Number of Occurrences:   1    Standing Expiration Date:   09/09/2019  . Magnesium    Standing Status:   Future    Number of Occurrences:   1    Standing Expiration Date:   08/05/2019  . Comprehensive metabolic panel    Standing Status:   Standing    Number of Occurrences:   22    Standing Expiration Date:   08/05/2019  . CBC with Differential/Platelet    Standing Status:   Standing    Number of Occurrences:   22    Standing Expiration Date:   08/05/2019  . Magnesium    Standing Status:   Standing    Number of Occurrences:   22    Standing Expiration Date:   08/05/2019  . Sample to Blood Bank    Standing Status:   Standing    Number of Occurrences:   33    Standing Expiration Date:   08/05/2019     CHIEF COMPLAINTS/PURPOSE OF CONSULTATION:  Cervical cancer, for further follow-up  HISTORY OF PRESENTING ILLNESS:  Jordan Waters  43 y.o. female is here because of recent diagnosis of cervical cancer The patient presented with abdominal discomfort and abnormal vaginal bleeding She underwent further evaluation including imaging study and biopsy which confirmed cervical cancer She has started on radiation therapy due to severe bleeding Her bleeding has stopped since then She has some mild diarrhea Appetite is stable She denies abdominal pain  I have reviewed her chart and materials related to her cancer extensively and collaborated history with the patient. Summary of oncologic history is as follows: Oncology History  Malignant neoplasm of overlapping sites of cervix ([place])  07/26/2018 Imaging   MR pelvis 1.  Poorly marginated/infiltrative 4.7 x 3.4 x 3.0 cm mass of the left uterine cervix with invasion of the cervical stroma, worrisome for FIGO stage IB3 cervical carcinoma. No frank parametrial, bladder or vaginal involvement. GYN ONC consultation suggested. 2. No pelvic adenopathy. 3. No suspicious ovarian or adnexal findings.   07/28/2018 Initial Diagnosis   Malignant neoplasm of overlapping sites of cervix [facility])   07/28/2018 Pathology Results   1. Cervix, biopsy, ectocervix - ADENOCARCINOMA. - SEE MICROSCOPIC DESCRIPTION 2. Endocervix, curettage - ADENOCARCINOMA. - SEE MICROSCOPIC DESCRIPTION Microscopic Comment 1. -2. Both of the specimens show adenocarcinoma with similar features and the morphology favors endocervical primary. An endometrial primary cannot be completely excluded.     MEDICAL HISTORY:  Past Medical History:  Diagnosis Date  . Depression with anxiety   . Migraine   . Pelvic mass in female 07/28/2018  . Photophobia of both eyes 04/08/2018    SURGICAL HISTORY: Past Surgical History:  Procedure Laterality Date  . WISDOM TOOTH EXTRACTION      SOCIAL HISTORY: Social History   Socioeconomic History  . Marital status: Married    Spouse name: Leane Para  . Number of children: 3  . Years of education: 63  . Highest education level: Not on file  Occupational History  . Not on file  Social Needs  . Financial resource strain: Not on file  . Food insecurity    Worry: Not on file    Inability: Not on file  . Transportation needs    Medical: Not on file    Non-medical: Not on file  Tobacco Use  . Smoking status: Former Smoker    Types: Cigarettes    Quit date: 02/15/2018    Years since quitting: 0.4  . Smokeless tobacco: Never Used  Substance and Sexual Activity  . Alcohol use: Not Currently  . Drug use: Never  . Sexual activity: Yes  Lifestyle  . Physical activity    Days per week: Not on file    Minutes per session: Not on file  . Stress: Not on file   Relationships  . Social Herbalist on phone: Not on file    Gets together: Not on file    Attends religious service: Not on file    Active member of club or organization: Not on file    Attends meetings of clubs or organizations: Not on file    Relationship status: Not on file  . Intimate partner violence    Fear of current or ex partner: Not on file    Emotionally abused: Not on file    Physically abused: Not on file    Forced sexual activity: Not on file  Other Topics Concern  . Not on file  Social History Narrative   Lives with husband, children   Caffeine ,limited    FAMILY HISTORY: Family History  Problem Relation Age of Onset  . Heart disease Paternal Uncle   . Heart disease Paternal Grandfather   . Multiple sclerosis Mother   . Heart disease Father   . Multiple sclerosis Sister     ALLERGIES:  has No Known Allergies.  MEDICATIONS:  Current Outpatient Medications  Medication Sig Dispense Refill  . Ascorbic Acid (VITAMIN C) 100 MG tablet Take 100 mg by mouth daily.    Marland Kitchen docusate sodium (COLACE) 100 MG capsule Take 1 capsule (100 mg total) by mouth 2 (two) times daily. 60 capsule 1  . DULoxetine (CYMBALTA) 60 MG capsule Take 1 capsule (60 mg total) by mouth daily. 30 capsule 3  . ferrous sulfate 325 (65 FE) MG tablet Take 1 tablet (325 mg total) by mouth 2 (two) times daily with a meal. 60 tablet 3  . gabapentin (NEURONTIN) 100 MG capsule Take 1 capsule (100 mg total) by mouth 3 (three) times daily. 270 capsule 0  . lidocaine-prilocaine (EMLA) cream Apply to affected area once 30 g 3  . Multiple Vitamin (MULTIVITAMIN WITH MINERALS) TABS tablet Take 1 tablet by mouth daily.    . naproxen sodium (ALEVE) 220 MG tablet Take 220 mg by mouth daily as needed.    . ondansetron (ZOFRAN) 8 MG tablet Take 1 tablet (8 mg total) by mouth every 8 (eight) hours as needed. 30 tablet 1  . prochlorperazine (COMPAZINE) 10 MG tablet Take 1 tablet (10 mg total) by mouth every 6  (six) hours as needed (Nausea or vomiting). 30 tablet 1   No current facility-administered medications for this visit.     REVIEW OF SYSTEMS:   Constitutional: Denies fevers, chills or abnormal night sweats Eyes: Denies blurriness of vision, double vision or watery eyes Ears, nose, mouth, throat, and face: Denies mucositis or sore throat Respiratory: Denies cough, dyspnea or wheezes Cardiovascular: Denies palpitation, chest discomfort or lower extremity swelling Gastrointestinal:  Denies nausea, heartburn or change in bowel habits Skin: Denies abnormal skin rashes Lymphatics: Denies new lymphadenopathy or easy bruising Neurological:Denies numbness, tingling or new weaknesses Behavioral/Psych: Mood is stable, no new changes  All other systems were reviewed with the patient and are negative.  PHYSICAL EXAMINATION: ECOG PERFORMANCE STATUS: 1 - Symptomatic but completely ambulatory  Vitals:   08/05/18 1016  BP: 112/65  Pulse: 96  Resp: 20  Temp: 98.9 F (37.2 C)  SpO2: 100%   Filed Weights   08/05/18 1016  Weight: 122 lb 3.2 oz (55.4 kg)    GENERAL:alert, no distress and comfortable SKIN: skin color, texture, turgor are normal, no rashes or significant lesions EYES: normal, conjunctiva are pink and non-injected, sclera clear OROPHARYNX:no exudate, no erythema and lips, buccal mucosa, and tongue normal  NECK: supple, thyroid normal size, non-tender, without nodularity LYMPH:  no palpable lymphadenopathy in the cervical, axillary or inguinal LUNGS: clear to auscultation and percussion with normal breathing effort HEART: regular rate & rhythm and no murmurs and no lower extremity edema ABDOMEN:abdomen soft, non-tender and normal bowel sounds Musculoskeletal:no cyanosis of digits and no clubbing  PSYCH: alert & oriented x 3 with fluent speech NEURO: no focal motor/sensory deficits  LABORATORY DATA:  I have reviewed the data as listed Lab Results  Component Value Date   WBC  3.8 (L) 08/05/2018   HGB 9.0 (L) 08/05/2018   HCT 29.1 (L) 08/05/2018   MCV 99.0 08/05/2018   PLT 405 (H) 08/05/2018   Recent Labs    03/20/18 1237 04/08/18 1641 07/25/18 0730 07/26/18 0551  08/05/18 1148  NA 140  --  136 138 139  K 4.5  --  3.6 3.5 3.9  CL 101  --  104 107 110  CO2 24  --  24 23 21*  GLUCOSE 82  --  103* 100* 75  BUN 7  --  <5* 9 7  CREATININE 0.71  --  0.75 0.71 0.69  CALCIUM 9.9  --  8.2* 8.5* 8.5*  GFRNONAA 105  --  >60 >60 >60  GFRAA 121  --  >60 >60 >60  PROT 7.0  --   --   --  6.9  ALBUMIN 4.7 3.8  --   --  3.9  AST 11  --   --   --  13*  ALT 5  --   --   --  9  ALKPHOS 54  --   --   --  51  BILITOT 0.4  --   --   --  0.3    RADIOGRAPHIC STUDIES: I have reviewed imaging study with the patient I have personally reviewed the radiological images as listed and agreed with the findings in the report. Mr Pelvis W Wo Contrast  Result Date: 07/26/2018 CLINICAL DATA:  43 year old female inpatient with abnormal uterine bleeding with abnormal pelvic ultrasound. EXAM: MRI PELVIS WITHOUT AND WITH CONTRAST TECHNIQUE: Multiplanar multisequence MR imaging of the pelvis was performed both before and after administration of intravenous contrast. CONTRAST:  5.6 cc Gadavist IV. COMPARISON:  07/25/2018 pelvic sonogram. FINDINGS: Urinary Tract:  Normal bladder.  Normal urethra. Bowel: Visualized small and large bowel are normal caliber with no bowel wall thickening. Vascular/Lymphatic: No pathologically enlarged lymph nodes in the pelvis. No acute vascular abnormality. Reproductive: Uterus: The mildly enlarged anteverted uterus measures 10.7 x 5.4 x 5.5 cm. No uterine fibroids. Inner myometrium (junctional zone) measures 15 mm in thickness, abnormally thickened, compatible with diffuse uterine adenomyosis. Endometrium measures 9 mm in bilayer thickness. There is a 4.7 x 3.4 x 3.0 cm poorly marginated infiltrative mass centered in the anterior left uterine cervix (series 2/image 11,  series 4/image 18, series 21/image 58), which extends 12:00 to 4:00 in the lower cervix, and extends 12:00 to 8:00 in the upper cervix, demonstrating intermediate T2 signal intensity and enhancement. This mass demonstrates cervical stromal invasion of at least 6 mm (series 3/image 19), with no frank parametrial invasion. Mass appears to invade the lower most portion of the uterine cavity. No evidence of vaginal or bladder invasion. Findings are worrisome for a FIGO stage IB3 cervical carcinoma. Ovaries and Adnexa: The right ovary measures 3.1 x 2.0 x 1.1 cm and is normal. The left ovary measures 3.5 x 2.7 x 2.9 cm and contains a simple 2.8 x 2.1 x 2.1 cm cyst. There are no suspicious ovarian or adnexal masses. Other: No abnormal free fluid in the pelvis. No focal pelvic fluid collection. Musculoskeletal: No aggressive appearing focal osseous lesions. IMPRESSION: 1. Poorly marginated/infiltrative 4.7 x 3.4 x 3.0 cm mass of the left uterine cervix with invasion of the cervical stroma, worrisome for FIGO stage IB3 cervical carcinoma. No frank parametrial, bladder or vaginal involvement. GYN ONC consultation suggested. 2. No pelvic adenopathy. 3. No suspicious ovarian or adnexal findings. Electronically Signed   By: Ilona Sorrel M.D.   On: 07/26/2018 18:01   Nm Pet Image Initial (pi) Skull Base To Thigh  Result Date: 08/03/2018 CLINICAL DATA:  Initial treatment strategy for cervical cancer. EXAM: NUCLEAR MEDICINE PET SKULL BASE TO THIGH TECHNIQUE: 6.2  mCi F-18 FDG was injected intravenously. Full-ring PET imaging was performed from the skull base to thigh after the radiotracer. CT data was obtained and used for attenuation correction and anatomic localization. Fasting blood glucose: 84 mg/dl COMPARISON:  None. FINDINGS: Mediastinal blood pool activity: SUV max 1.8 Liver activity: SUV max NA NECK: No hypermetabolic lymph nodes in the neck. Symmetric uptake in the oropharynx bilaterally is nonspecific. Incidental CT  findings: none CHEST: No hypermetabolic mediastinal or hilar nodes.2 mm right lower lobe nodule visible on 33/3. No overtly suspicious nodule or mass. Incidental CT findings: None. ABDOMEN/PELVIS: Cervical mass is markedly hypermetabolic with SUV max = 16.1. No hypermetabolic pelvic sidewall lymphadenopathy. No evidence for hypermetabolic metastatic disease in the abdomen/pelvis. Incidental CT findings: Tiny calcified gallstones evident. There is abdominal aortic atherosclerosis without aneurysm. Trace intraperitoneal free fluid evident. SKELETON: No focal hypermetabolic activity to suggest skeletal metastasis. Incidental CT findings: none IMPRESSION: 1. Hypermetabolic cervical mass compatible with known neoplasm. No evidence for hypermetabolic metastatic disease in the neck, chest, abdomen, or pelvis. Electronically Signed   By: Misty Stanley M.D.   On: 08/03/2018 13:18   US Pelvic Complete With Transvaginal  Result Date: 07/25/2018 CLINICAL DATA:  Abnormal uterine bleeding for a month. EXAM: TRANSABDOMINAL AND TRANSVAGINAL ULTRASOUND OF PELVIS TECHNIQUE: Both transabdominal and transvaginal ultrasound examinations of the pelvis were performed. Transabdominal technique was performed for global imaging of the pelvis including uterus, ovaries, adnexal regions, and pelvic cul-de-sac. It was necessary to proceed with endovaginal exam following the transabdominal exam to visualize the endometrium and ovaries. COMPARISON:  None FINDINGS: Uterus Measurements: 9.4 x 4.2 x 5.7 cm = volume: 118.4 mL. No fibroids or other mass visualized. Endometrium The endometrium measures 3.8 mm in thickness at the fundus. However, there is a masslike appearance to the endometrium in the uterine body and lower uterine segment measuring 3.2 by 7.9 x 2.8 cm with internal blood flow. There is also fluid in the endometrial canal, likely blood given history. Right ovary Measurements: 2.5 x 1.2 x 1.3 cm = volume: 2 mL. Normal appearance/no  adnexal mass. Left ovary Measurements: 3.5 x 2 x 3.1 cm = volume: 11.2 mL. Contains a 2.6 cm follicle. Other findings There is a small amount of free fluid in the pelvis, likely physiologic. IMPRESSION: 1. The endometrium has a masslike appearance in the uterine body and lower uterine segment measuring 3.2 x 7.9 x 2.8 cm. There is also fluid in the endometrial canal, likely blood products given history. The constellation of findings are concerning for an endometrial neoplasm. Recommend gynecologic consultation with endometrial biopsy. 2. No other abnormalities. Electronically Signed   By: Dorise Bullion III M.D   On: 07/25/2018 18:13    I spent 60 minutes counseling the patient face to face. The total time spent in the appointment was 80 minutes and more than 50% was on counseling.  All questions were answered. The patient knows to call the clinic with any problems, questions or concerns.  Heath Lark, MD 08/05/2018 1:38 PM

## 2018-08-05 NOTE — Assessment & Plan Note (Signed)
She has mild persistent pancytopenia but improved She does not need transfusion support

## 2018-08-06 ENCOUNTER — Ambulatory Visit (HOSPITAL_COMMUNITY): Admission: RE | Admit: 2018-08-06 | Payer: Managed Care, Other (non HMO) | Source: Ambulatory Visit

## 2018-08-06 ENCOUNTER — Ambulatory Visit
Admission: RE | Admit: 2018-08-06 | Discharge: 2018-08-06 | Disposition: A | Discharge status: Home / Self Care | Payer: Managed Care, Other (non HMO) | Source: Ambulatory Visit | Attending: Radiation Oncology | Admitting: Radiation Oncology

## 2018-08-06 DIAGNOSIS — C538 Malignant neoplasm of overlapping sites of cervix uteri: Secondary | ICD-10-CM | POA: Diagnosis not present

## 2018-08-07 ENCOUNTER — Ambulatory Visit
Admission: RE | Admit: 2018-08-07 | Discharge: 2018-08-07 | Disposition: A | Discharge status: Home / Self Care | Payer: Managed Care, Other (non HMO) | Source: Ambulatory Visit | Attending: Radiation Oncology | Admitting: Radiation Oncology

## 2018-08-07 DIAGNOSIS — C538 Malignant neoplasm of overlapping sites of cervix uteri: Secondary | ICD-10-CM | POA: Diagnosis not present

## 2018-08-09 ENCOUNTER — Other Ambulatory Visit: Payer: Self-pay | Admitting: Radiology

## 2018-08-10 ENCOUNTER — Telehealth: Payer: Self-pay | Admitting: Obstetrics & Gynecology

## 2018-08-10 ENCOUNTER — Other Ambulatory Visit: Payer: Self-pay

## 2018-08-10 ENCOUNTER — Ambulatory Visit
Admission: RE | Admit: 2018-08-10 | Discharge: 2018-08-10 | Disposition: A | Discharge status: Home / Self Care | Payer: Managed Care, Other (non HMO) | Source: Ambulatory Visit | Attending: Radiation Oncology | Admitting: Radiation Oncology

## 2018-08-10 DIAGNOSIS — C538 Malignant neoplasm of overlapping sites of cervix uteri: Secondary | ICD-10-CM | POA: Diagnosis not present

## 2018-08-10 NOTE — Telephone Encounter (Signed)
Attempted to get patient scheduled for follow-up appointment. The only time she could come was on a Tuesday. Her Mondays were filled with another appointment. Her husband reworked his schedule to be able to take her to her appointments.

## 2018-08-11 ENCOUNTER — Ambulatory Visit
Admission: RE | Admit: 2018-08-11 | Discharge: 2018-08-11 | Disposition: A | Discharge status: Home / Self Care | Payer: Managed Care, Other (non HMO) | Source: Ambulatory Visit | Attending: Radiation Oncology | Admitting: Radiation Oncology

## 2018-08-11 ENCOUNTER — Ambulatory Visit (HOSPITAL_COMMUNITY)
Admission: RE | Admit: 2018-08-11 | Discharge: 2018-08-11 | Disposition: A | Discharge status: Home / Self Care | Payer: Managed Care, Other (non HMO) | Source: Ambulatory Visit | Attending: Hematology and Oncology | Admitting: Hematology and Oncology

## 2018-08-11 ENCOUNTER — Other Ambulatory Visit (HOSPITAL_COMMUNITY): Payer: Managed Care, Other (non HMO)

## 2018-08-11 ENCOUNTER — Encounter (HOSPITAL_COMMUNITY): Payer: Self-pay | Admitting: Interventional Radiology

## 2018-08-11 ENCOUNTER — Telehealth: Payer: Self-pay | Admitting: Oncology

## 2018-08-11 ENCOUNTER — Encounter: Payer: Self-pay | Admitting: Oncology

## 2018-08-11 ENCOUNTER — Other Ambulatory Visit: Payer: Self-pay | Admitting: Radiology

## 2018-08-11 DIAGNOSIS — D61818 Other pancytopenia: Secondary | ICD-10-CM | POA: Diagnosis not present

## 2018-08-11 DIAGNOSIS — Z79899 Other long term (current) drug therapy: Secondary | ICD-10-CM | POA: Diagnosis not present

## 2018-08-11 DIAGNOSIS — Z87891 Personal history of nicotine dependence: Secondary | ICD-10-CM | POA: Diagnosis not present

## 2018-08-11 DIAGNOSIS — D649 Anemia, unspecified: Secondary | ICD-10-CM | POA: Diagnosis not present

## 2018-08-11 DIAGNOSIS — C538 Malignant neoplasm of overlapping sites of cervix uteri: Secondary | ICD-10-CM

## 2018-08-11 DIAGNOSIS — F418 Other specified anxiety disorders: Secondary | ICD-10-CM | POA: Diagnosis not present

## 2018-08-11 HISTORY — PX: IR IMAGING GUIDED PORT INSERTION: IMG5740

## 2018-08-11 LAB — COMPREHENSIVE METABOLIC PANEL
ALT: 15 U/L (ref 0–44)
AST: 16 U/L (ref 15–41)
Albumin: 4.1 g/dL (ref 3.5–5.0)
Alkaline Phosphatase: 47 U/L (ref 38–126)
Anion gap: 13 (ref 5–15)
BUN: 6 mg/dL (ref 6–20)
CO2: 21 mmol/L — ABNORMAL LOW (ref 22–32)
Calcium: 9 mg/dL (ref 8.9–10.3)
Chloride: 105 mmol/L (ref 98–111)
Creatinine, Ser: 0.59 mg/dL (ref 0.44–1.00)
GFR calc Af Amer: >60 mL/min (ref >60)
GFR calc non Af Amer: >60 mL/min (ref >60)
Glucose, Bld: 84 mg/dL (ref 70–99)
Potassium: 3.7 mmol/L (ref 3.5–5.1)
Sodium: 139 mmol/L (ref 135–145)
Total Bilirubin: 0.4 mg/dL (ref 0.3–1.2)
Total Protein: 7 g/dL (ref 6.5–8.1)

## 2018-08-11 LAB — CBC WITH DIFFERENTIAL/PLATELET
Abs Immature Granulocytes: 0.01 10*3/uL (ref 0.00–0.07)
Basophils Absolute: 0 10*3/uL (ref 0.0–0.1)
Basophils Relative: 1 %
Eosinophils Absolute: 0.1 10*3/uL (ref 0.0–0.5)
Eosinophils Relative: 4 %
HCT: 30.9 % — ABNORMAL LOW (ref 36.0–46.0)
Hemoglobin: 9.8 g/dL — ABNORMAL LOW (ref 12.0–15.0)
Immature Granulocytes: 0 %
Lymphocytes Relative: 21 %
Lymphs Abs: 0.6 10*3/uL — ABNORMAL LOW (ref 0.7–4.0)
MCH: 31.4 pg (ref 26.0–34.0)
MCHC: 31.7 g/dL (ref 30.0–36.0)
MCV: 99 fL (ref 80.0–100.0)
Monocytes Absolute: 0.3 10*3/uL (ref 0.1–1.0)
Monocytes Relative: 10 %
Neutro Abs: 1.8 10*3/uL (ref 1.7–7.7)
Neutrophils Relative %: 64 %
Platelets: 269 10*3/uL (ref 150–400)
RBC: 3.12 MIL/uL — ABNORMAL LOW (ref 3.87–5.11)
RDW: 15.1 % (ref 11.5–15.5)
WBC: 2.9 10*3/uL — ABNORMAL LOW (ref 4.0–10.5)
nRBC: 0 % (ref 0.0–0.2)

## 2018-08-11 LAB — PROTIME-INR
INR: 1 (ref 0.8–1.2)
Prothrombin Time: 13 seconds (ref 11.4–15.2)

## 2018-08-11 IMAGING — XA IR LEFT FLUORO GUIDE CV LINE
1 series · 1 of 1 positions shown · non-contrast
Comparison: none

INDICATION: 42-year-old female with a history of cervical carcinoma referred for
port catheter placement

[Series 300: ir imaging guided port insertion · 1 of 1 slices shown]
[im 1/1]
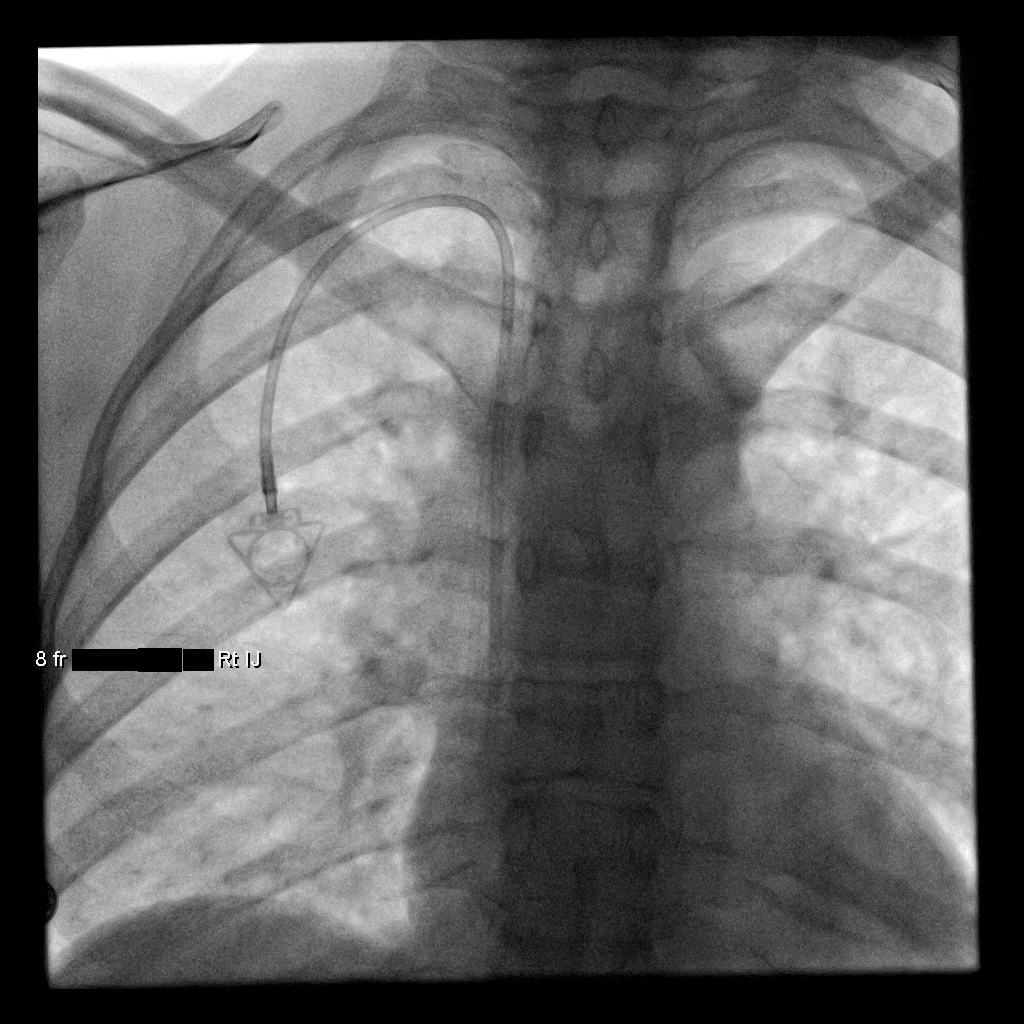

[1 of 1 positions shown; findings below may reference images not displayed]

EXAM:
IMPLANTED PORT A CATH PLACEMENT WITH ULTRASOUND AND FLUOROSCOPIC
GUIDANCE

MEDICATIONS:
2 g Ancef; The antibiotic was administered within an appropriate
time interval prior to skin puncture.

ANESTHESIA/SEDATION:
Moderate (conscious) sedation was employed during this procedure. A
total of Versed 3.0 mg and Fentanyl 100 mcg was administered
intravenously.

Moderate Sedation Time: 21 minutes. The patient's level of
consciousness and vital signs were monitored continuously by
radiology nursing throughout the procedure under my direct
supervision.

FLUOROSCOPY TIME:  0 minutes, 6 seconds (0 mGy)

COMPLICATIONS:
None

PROCEDURE:
The procedure, risks, benefits, and alternatives were explained to
the patient. Questions regarding the procedure were encouraged and
answered. The patient understands and consents to the procedure.

Ultrasound survey was performed with images stored and sent to PACs.

The right neck and chest was prepped with chlorhexidine, and draped
in the usual sterile fashion using maximum barrier technique (cap
and mask, sterile gown, sterile gloves, large sterile sheet, hand
hygiene and cutaneous antiseptic). Antibiotic prophylaxis was
provided with 2.0g Ancef administered IV one hour prior to skin
incision. Local anesthesia was attained by infiltration with 1%
lidocaine without epinephrine.

Ultrasound demonstrated patency of the right internal jugular vein,
and this was documented with an image. Under real-time ultrasound
guidance, this vein was accessed with a 21 gauge micropuncture
needle and image documentation was performed. A small dermatotomy
was made at the access site with an 11 scalpel. A 0.018" wire was
advanced into the SVC and used to estimate the length of the
internal catheter. The access needle exchanged for a 4F
micropuncture vascular sheath. The 0.018" wire was then removed and
a 0.035" wire advanced into the IVC.



The venous access site was then serially dilated and a peel away
vascular sheath placed over the wire. The wire was removed and the
port catheter advanced into position under fluoroscopic guidance.
The catheter tip is positioned in the cavoatrial junction. This was
documented with a spot image. The portacatheter was then tested and
found to flush and aspirate well. The port was flushed with saline
followed by 100 units/mL heparinized saline.

The pocket was then closed in two layers using first subdermal
inverted interrupted absorbable sutures followed by a running
subcuticular suture. The epidermis was then sealed with Dermabond.
The dermatotomy at the venous access site was also seal with
Dermabond.

Patient tolerated the procedure well and remained hemodynamically
stable throughout.

No complications encountered and no significant blood loss
encountered
IMPRESSION: Status post right IJ port catheter placement. Catheter ready for
use.

## 2018-08-11 IMAGING — US IR LEFT FLUORO GUIDE CV LINE
1 series · 3 of 3 positions shown · non-contrast
Comparison: none

INDICATION: 42-year-old female with a history of cervical carcinoma referred for
port catheter placement

[Series 1: ir fluoro/shunt/fist · 3 of 3 slices shown]
[im 1/3]
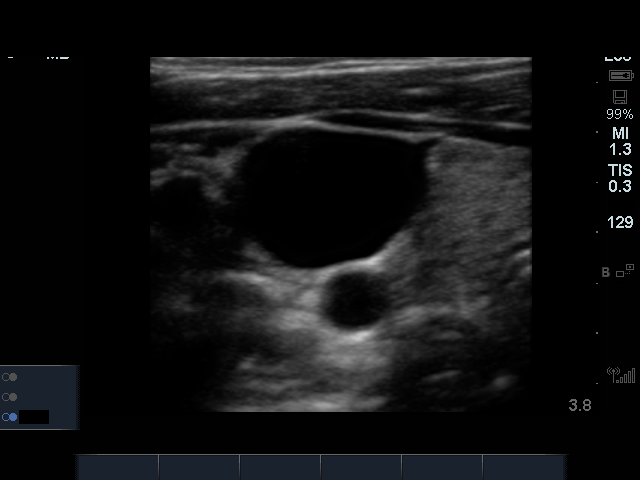
[im 2/3]
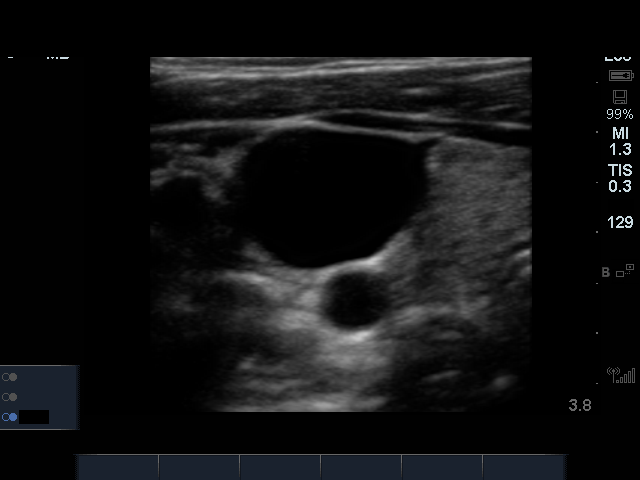
[im 3/3]
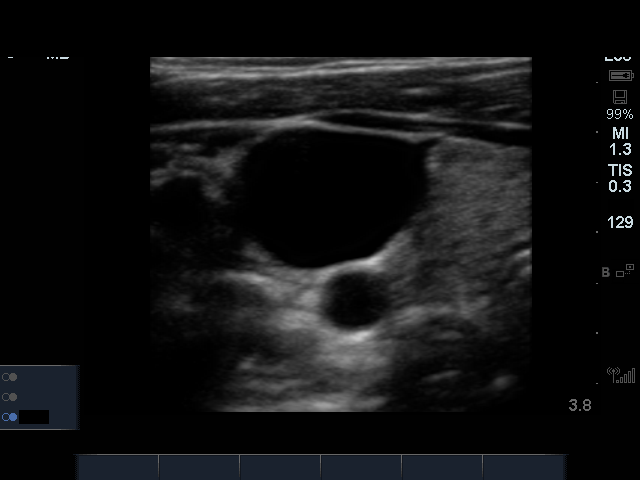

[3 of 3 positions shown; findings below may reference images not displayed]

EXAM:
IMPLANTED PORT A CATH PLACEMENT WITH ULTRASOUND AND FLUOROSCOPIC
GUIDANCE

MEDICATIONS:
2 g Ancef; The antibiotic was administered within an appropriate
time interval prior to skin puncture.

ANESTHESIA/SEDATION:
Moderate (conscious) sedation was employed during this procedure. A
total of Versed 3.0 mg and Fentanyl 100 mcg was administered
intravenously.

Moderate Sedation Time: 21 minutes. The patient's level of
consciousness and vital signs were monitored continuously by
radiology nursing throughout the procedure under my direct
supervision.

FLUOROSCOPY TIME:  0 minutes, 6 seconds (0 mGy)

COMPLICATIONS:
None

PROCEDURE:
The procedure, risks, benefits, and alternatives were explained to
the patient. Questions regarding the procedure were encouraged and
answered. The patient understands and consents to the procedure.

Ultrasound survey was performed with images stored and sent to PACs.

The right neck and chest was prepped with chlorhexidine, and draped
in the usual sterile fashion using maximum barrier technique (cap
and mask, sterile gown, sterile gloves, large sterile sheet, hand
hygiene and cutaneous antiseptic). Antibiotic prophylaxis was
provided with 2.0g Ancef administered IV one hour prior to skin
incision. Local anesthesia was attained by infiltration with 1%
lidocaine without epinephrine.

Ultrasound demonstrated patency of the right internal jugular vein,
and this was documented with an image. Under real-time ultrasound
guidance, this vein was accessed with a 21 gauge micropuncture
needle and image documentation was performed. A small dermatotomy
was made at the access site with an 11 scalpel. A 0.018" wire was
advanced into the SVC and used to estimate the length of the
internal catheter. The access needle exchanged for a 4F
micropuncture vascular sheath. The 0.018" wire was then removed and
a 0.035" wire advanced into the IVC.



The venous access site was then serially dilated and a peel away
vascular sheath placed over the wire. The wire was removed and the
port catheter advanced into position under fluoroscopic guidance.
The catheter tip is positioned in the cavoatrial junction. This was
documented with a spot image. The portacatheter was then tested and
found to flush and aspirate well. The port was flushed with saline
followed by 100 units/mL heparinized saline.

The pocket was then closed in two layers using first subdermal
inverted interrupted absorbable sutures followed by a running
subcuticular suture. The epidermis was then sealed with Dermabond.
The dermatotomy at the venous access site was also seal with
Dermabond.

Patient tolerated the procedure well and remained hemodynamically
stable throughout.

No complications encountered and no significant blood loss
encountered
IMPRESSION: Status post right IJ port catheter placement. Catheter ready for
use.

## 2018-08-11 MED ORDER — LIDOCAINE HCL (PF) 1 % IJ SOLN
INTRAMUSCULAR | Status: AC | PRN
  Administered 2018-08-11: 10 mL

## 2018-08-11 MED ORDER — SODIUM CHLORIDE 0.9 % IV SOLN
INTRAVENOUS | Status: DC
  Administered 2018-08-11: 11:00:00 via INTRAVENOUS

## 2018-08-11 MED ORDER — LIDOCAINE HCL 1 % IJ SOLN
INTRAMUSCULAR | Status: AC
  Filled 2018-08-11: qty 20

## 2018-08-11 MED ORDER — MIDAZOLAM HCL 2 MG/2ML IJ SOLN
INTRAMUSCULAR | Status: AC
  Filled 2018-08-11: qty 4

## 2018-08-11 MED ORDER — LIDOCAINE HCL (PF) 1 % IJ SOLN
INTRAMUSCULAR | Status: AC | PRN
  Administered 2018-08-11: 5 mL

## 2018-08-11 MED ORDER — MIDAZOLAM HCL 2 MG/2ML IJ SOLN
INTRAMUSCULAR | Status: AC | PRN
  Administered 2018-08-11 (×2): 1 mg via INTRAVENOUS

## 2018-08-11 MED ORDER — FENTANYL CITRATE (PF) 100 MCG/2ML IJ SOLN
INTRAMUSCULAR | Status: AC
  Filled 2018-08-11: qty 2

## 2018-08-11 MED ORDER — CEFAZOLIN SODIUM-DEXTROSE 2-4 GM/100ML-% IV SOLN
INTRAVENOUS | Status: AC
  Administered 2018-08-11: 12:00:00 2 g via INTRAVENOUS
  Filled 2018-08-11: qty 100

## 2018-08-11 MED ORDER — CEFAZOLIN SODIUM-DEXTROSE 2-4 GM/100ML-% IV SOLN
2.0000 g | INTRAVENOUS | Status: AC
  Administered 2018-08-11: 12:00:00 2 g via INTRAVENOUS

## 2018-08-11 MED ORDER — HEPARIN SOD (PORK) LOCK FLUSH 100 UNIT/ML IV SOLN
INTRAVENOUS | Status: AC | PRN
  Administered 2018-08-11: 500 [IU] via INTRAVENOUS

## 2018-08-11 MED ORDER — FENTANYL CITRATE (PF) 100 MCG/2ML IJ SOLN
INTRAMUSCULAR | Status: AC | PRN
  Administered 2018-08-11: 50 ug via INTRAVENOUS

## 2018-08-11 MED ORDER — HEPARIN SOD (PORK) LOCK FLUSH 100 UNIT/ML IV SOLN
INTRAVENOUS | Status: AC
  Filled 2018-08-11: qty 5

## 2018-08-11 NOTE — Consult Note (Signed)
Chief Complaint: Patient was seen in consultation today for Port-A-Cath placement  Referring Physician(s): [place]  Supervising Physician: Corrie Mckusick  Patient Status: [facility] - Out-pt  History of Present Illness: Jordan Waters is a 43 y.o. female with history of recently diagnosed cervical cancer and currently undergoing radiation therapy who presents today for Port-A-Cath placement for chemotherapy.  Past Medical History:  Diagnosis Date   Depression with anxiety    Migraine    Pelvic mass in female 07/28/2018   Photophobia of both eyes 04/08/2018    Past Surgical History:  Procedure Laterality Date   WISDOM TOOTH EXTRACTION      Allergies: Patient has no known allergies.  Medications: Prior to Admission medications   Medication Sig Start Date End Date Taking? Authorizing Provider  Ascorbic Acid (VITAMIN C) 100 MG tablet Take 100 mg by mouth daily.    [provider]  docusate sodium (COLACE) 100 MG capsule Take 1 capsule (100 mg total) by mouth 2 (two) times daily. 07/28/18   Sloan Leiter, MD  DULoxetine (CYMBALTA) 60 MG capsule Take 1 capsule (60 mg total) by mouth daily. 04/08/18   Sater, Nanine Means, MD  ferrous sulfate 325 (65 FE) MG tablet Take 1 tablet (325 mg total) by mouth 2 (two) times daily with a meal. 07/28/18   Sloan Leiter, MD  gabapentin (NEURONTIN) 100 MG capsule Take 1 capsule (100 mg total) by mouth 3 (three) times daily. 06/09/18   Mina Marble D, NP  lidocaine-prilocaine (EMLA) cream Apply to affected area once 08/05/18   Heath Lark, MD  Multiple Vitamin (MULTIVITAMIN WITH MINERALS) TABS tablet Take 1 tablet by mouth daily.    [provider]  naproxen sodium (ALEVE) 220 MG tablet Take 220 mg by mouth daily as needed.    [provider]  ondansetron (ZOFRAN) 8 MG tablet Take 1 tablet (8 mg total) by mouth every 8 (eight) hours as needed. 08/05/18   Heath Lark, MD  prochlorperazine (COMPAZINE) 10 MG tablet Take 1  tablet (10 mg total) by mouth every 6 (six) hours as needed (Nausea or vomiting). 08/05/18   Heath Lark, MD     Family History  Problem Relation Age of Onset   Heart disease Paternal Uncle    Heart disease Paternal Grandfather    Multiple sclerosis Mother    Heart disease Father    Multiple sclerosis Sister     Social History   Socioeconomic History   Marital status: Married    Spouse name: Leane Para   Number of children: 3   Years of education: 12   Highest education level: Not on file  Occupational History   Not on file  Social Needs   Financial resource strain: Not on file   Food insecurity    Worry: Not on file    Inability: Not on file   Transportation needs    Medical: Not on file    Non-medical: Not on file  Tobacco Use   Smoking status: Former Smoker    Types: Cigarettes    Quit date: 02/15/2018    Years since quitting: 0.4   Smokeless tobacco: Never Used  Substance and Sexual Activity   Alcohol use: Not Currently   Drug use: Never   Sexual activity: Yes  Lifestyle   Physical activity    Days per week: Not on file    Minutes per session: Not on file   Stress: Not on file  Relationships   Social connections  Talks on phone: Not on file    Gets together: Not on file    Attends religious service: Not on file    Active member of club or organization: Not on file    Attends meetings of clubs or organizations: Not on file    Relationship status: Not on file  Other Topics Concern   Not on file  Social History Narrative   Lives with husband, children   Caffeine ,limited      Review of Systems denies fever, headache, chest pain, dyspnea, cough, back pain, nausea, vomiting.  She has had loose stools, abdominal/pelvic discomfort/cramping as well as vaginal bleeding  Vital Signs: BP 115/67    Pulse 80    Temp 98.3 F (36.8 C) (Oral)    Resp 16    SpO2 100%   Physical Exam awake, alert.  Chest clear to auscultation bilaterally.  Heart  with regular rate and rhythm.  Abdomen soft, positive bowel sounds, mildly tender anterior pelvic region to palpation; no LE edema  Imaging: Mr Pelvis W Wo Contrast  Result Date: 07/26/2018 CLINICAL DATA:  43 year old female inpatient with abnormal uterine bleeding with abnormal pelvic ultrasound. EXAM: MRI PELVIS WITHOUT AND WITH CONTRAST TECHNIQUE: Multiplanar multisequence MR imaging of the pelvis was performed both before and after administration of intravenous contrast. CONTRAST:  5.6 cc Gadavist IV. COMPARISON:  07/25/2018 pelvic sonogram. FINDINGS: Urinary Tract:  Normal bladder.  Normal urethra. Bowel: Visualized small and large bowel are normal caliber with no bowel wall thickening. Vascular/Lymphatic: No pathologically enlarged lymph nodes in the pelvis. No acute vascular abnormality. Reproductive: Uterus: The mildly enlarged anteverted uterus measures 10.7 x 5.4 x 5.5 cm. No uterine fibroids. Inner myometrium (junctional zone) measures 15 mm in thickness, abnormally thickened, compatible with diffuse uterine adenomyosis. Endometrium measures 9 mm in bilayer thickness. There is a 4.7 x 3.4 x 3.0 cm poorly marginated infiltrative mass centered in the anterior left uterine cervix (series 2/image 11, series 4/image 18, series 21/image 58), which extends 12:00 to 4:00 in the lower cervix, and extends 12:00 to 8:00 in the upper cervix, demonstrating intermediate T2 signal intensity and enhancement. This mass demonstrates cervical stromal invasion of at least 6 mm (series 3/image 19), with no frank parametrial invasion. Mass appears to invade the lower most portion of the uterine cavity. No evidence of vaginal or bladder invasion. Findings are worrisome for a FIGO stage IB3 cervical carcinoma. Ovaries and Adnexa: The right ovary measures 3.1 x 2.0 x 1.1 cm and is normal. The left ovary measures 3.5 x 2.7 x 2.9 cm and contains a simple 2.8 x 2.1 x 2.1 cm cyst. There are no suspicious ovarian or adnexal  masses. Other: No abnormal free fluid in the pelvis. No focal pelvic fluid collection. Musculoskeletal: No aggressive appearing focal osseous lesions. IMPRESSION: 1. Poorly marginated/infiltrative 4.7 x 3.4 x 3.0 cm mass of the left uterine cervix with invasion of the cervical stroma, worrisome for FIGO stage IB3 cervical carcinoma. No frank parametrial, bladder or vaginal involvement. GYN ONC consultation suggested. 2. No pelvic adenopathy. 3. No suspicious ovarian or adnexal findings. Electronically Signed   By: Ilona Sorrel M.D.   On: 07/26/2018 18:01   Nm Pet Image Initial (pi) Skull Base To Thigh  Result Date: 08/03/2018 CLINICAL DATA:  Initial treatment strategy for cervical cancer. EXAM: NUCLEAR MEDICINE PET SKULL BASE TO THIGH TECHNIQUE: 6.2 mCi F-18 FDG was injected intravenously. Full-ring PET imaging was performed from the skull base to thigh after the radiotracer. CT  data was obtained and used for attenuation correction and anatomic localization. Fasting blood glucose: 84 mg/dl COMPARISON:  None. FINDINGS: Mediastinal blood pool activity: SUV max 1.8 Liver activity: SUV max NA NECK: No hypermetabolic lymph nodes in the neck. Symmetric uptake in the oropharynx bilaterally is nonspecific. Incidental CT findings: none CHEST: No hypermetabolic mediastinal or hilar nodes.2 mm right lower lobe nodule visible on 33/3. No overtly suspicious nodule or mass. Incidental CT findings: None. ABDOMEN/PELVIS: Cervical mass is markedly hypermetabolic with SUV max = 25.9. No hypermetabolic pelvic sidewall lymphadenopathy. No evidence for hypermetabolic metastatic disease in the abdomen/pelvis. Incidental CT findings: Tiny calcified gallstones evident. There is abdominal aortic atherosclerosis without aneurysm. Trace intraperitoneal free fluid evident. SKELETON: No focal hypermetabolic activity to suggest skeletal metastasis. Incidental CT findings: none IMPRESSION: 1. Hypermetabolic cervical mass compatible with known  neoplasm. No evidence for hypermetabolic metastatic disease in the neck, chest, abdomen, or pelvis. Electronically Signed   By: Misty Stanley M.D.   On: 08/03/2018 13:18   US Pelvic Complete With Transvaginal  Result Date: 07/25/2018 CLINICAL DATA:  Abnormal uterine bleeding for a month. EXAM: TRANSABDOMINAL AND TRANSVAGINAL ULTRASOUND OF PELVIS TECHNIQUE: Both transabdominal and transvaginal ultrasound examinations of the pelvis were performed. Transabdominal technique was performed for global imaging of the pelvis including uterus, ovaries, adnexal regions, and pelvic cul-de-sac. It was necessary to proceed with endovaginal exam following the transabdominal exam to visualize the endometrium and ovaries. COMPARISON:  None FINDINGS: Uterus Measurements: 9.4 x 4.2 x 5.7 cm = volume: 118.4 mL. No fibroids or other mass visualized. Endometrium The endometrium measures 3.8 mm in thickness at the fundus. However, there is a masslike appearance to the endometrium in the uterine body and lower uterine segment measuring 3.2 by 7.9 x 2.8 cm with internal blood flow. There is also fluid in the endometrial canal, likely blood given history. Right ovary Measurements: 2.5 x 1.2 x 1.3 cm = volume: 2 mL. Normal appearance/no adnexal mass. Left ovary Measurements: 3.5 x 2 x 3.1 cm = volume: 11.2 mL. Contains a 2.6 cm follicle. Other findings There is a small amount of free fluid in the pelvis, likely physiologic. IMPRESSION: 1. The endometrium has a masslike appearance in the uterine body and lower uterine segment measuring 3.2 x 7.9 x 2.8 cm. There is also fluid in the endometrial canal, likely blood products given history. The constellation of findings are concerning for an endometrial neoplasm. Recommend gynecologic consultation with endometrial biopsy. 2. No other abnormalities. Electronically Signed   By: Dorise Bullion III M.D   On: 07/25/2018 18:13    Labs:  CBC: Recent Labs    07/26/18 1445 07/27/18 0313  07/28/18 0240 08/05/18 1148  WBC 9.1 8.6 9.4 3.8*  HGB 9.1* 8.7* 8.1* 9.0*  HCT 26.7* 25.5* 24.0* 29.1*  PLT 260 246 268 405*    COAGS: No results for input(s): INR, APTT in the last 8760 hours.  BMP: Recent Labs    03/20/18 1237 07/25/18 0730 07/26/18 0551 08/05/18 1148  NA 140 136 138 139  K 4.5 3.6 3.5 3.9  CL 101 104 107 110  CO2 24 24 23  21*  GLUCOSE 82 103* 100* 75  BUN 7 <5* 9 7  CALCIUM 9.9 8.2* 8.5* 8.5*  CREATININE 0.71 0.75 0.71 0.69  GFRNONAA 105 >60 >60 >60  GFRAA 121 >60 >60 >60    LIVER FUNCTION TESTS: Recent Labs    03/20/18 1237 04/08/18 1641 08/05/18 1148  BILITOT 0.4  --  0.3  AST  11  --  13*  ALT 5  --  9  ALKPHOS 54  --  51  PROT 7.0  --  6.9  ALBUMIN 4.7 3.8 3.9    TUMOR MARKERS: No results for input(s): AFPTM, CEA, CA199, CHROMGRNA in the last 8760 hours.  Assessment and Plan: 43 y.o. female with history of recently diagnosed cervical cancer and currently undergoing radiation therapy who presents today for Port-A-Cath placement for chemotherapy.Risks and benefits of image guided port-a-catheter placement was discussed with the patient including, but not limited to bleeding, infection, pneumothorax, or fibrin sheath development and need for additional procedures.  All of the patient's questions were answered, patient is agreeable to proceed. Consent signed and in chart.  LABS PENDING   Thank you for this interesting consult.  I greatly enjoyed meeting Monna Crean and look forward to participating in their care.  A copy of this report was sent to the requesting provider on this date.  Electronically Signed: D. Rowe Robert, PA-C 08/11/2018, 10:33 AM   I spent a total of  25 minutes   in face to face in clinical consultation, greater than 50% of which was counseling/coordinating care for Port-A-Cath placement

## 2018-08-11 NOTE — Telephone Encounter (Signed)
Called Jordan Waters back and informed her that she may have bleeding on and off during treatment with cramping per Dr. Sondra Come from the tumor sloughing off.  Dr. Sondra Come said she is OK to have her port put in today and that he will see her this afternoon.  She verbalized understanding and agreement.

## 2018-08-11 NOTE — Telephone Encounter (Signed)
Jordan Waters called and said she is having painful cramping and vaginal bleeding that started this morning.  The bleeding started out as pink in color and is now red.  She said she is not bleeding as much as before when she went to the ER.  She has her porta cath appointment at [facility] at 10 am and also radiation at 4 pm today.  Advised her to come in for the port appointment and that I will notify Dr. Sondra Come.

## 2018-08-11 NOTE — Discharge Instructions (Signed)
Moderate Conscious Sedation, Adult, Care After These instructions provide you with information about caring for yourself after your procedure. Your health care provider may also give you more specific instructions. Your treatment has been planned according to current medical practices, but problems sometimes occur. Call your health care provider if you have any problems or questions after your procedure. What can I expect after the procedure? After your procedure, it is common:  To feel sleepy for several hours.  To feel clumsy and have poor balance for several hours.  To have poor judgment for several hours.  To vomit if you eat too soon. Follow these instructions at home: For at least 24 hours after the procedure:   Do not: ? Participate in activities where you could fall or become injured. ? Drive. ? Use heavy machinery. ? Drink alcohol. ? Take sleeping pills or medicines that cause drowsiness. ? Make important decisions or sign legal documents. ? Take care of children on your own.  Rest. Eating and drinking  Follow the diet recommended by your health care provider.  If you vomit: ? Drink water, juice, or soup when you can drink without vomiting. ? Make sure you have little or no nausea before eating solid foods. General instructions  Have a responsible adult stay with you until you are awake and alert.  Take over-the-counter and prescription medicines only as told by your health care provider.  If you smoke, do not smoke without supervision.  Keep all follow-up visits as told by your health care provider. This is important. Contact a health care provider if:  You keep feeling nauseous or you keep vomiting.  You feel light-headed.  You develop a rash.  You have a fever. Get help right away if:  You have trouble breathing. This information is not intended to replace advice given to you by your health care provider. Make sure you discuss any questions you have  with your health care provider. Document Released: 10/28/2012 Document Revised: 12/20/2016 Document Reviewed: 04/29/2015 Elsevier Patient Education  [place] Insertion, Care After This sheet gives you information about how to care for yourself after your procedure. Your health care provider may also give you more specific instructions. If you have problems or questions, contact your health care provider. What can I expect after the procedure? After the procedure, it is common to have:  Discomfort at the port insertion site.  Bruising on the skin over the port. This should improve over 3-4 days. Follow these instructions at home: [facility] care  After your port is placed, you will get a manufacturer's information card. The card has information about your port. Keep this card with you at all times.  Take care of the port as told by your health care provider. Ask your health care provider if you or a family member can get training for taking care of the port at home. A home health care nurse may also take care of the port.  Make sure to remember what type of port you have. Incision care      Follow instructions from your health care provider about how to take care of your port insertion site. Make sure you: ? Wash your hands with soap and water before and after you change your bandage (dressing). If soap and water are not available, use hand sanitizer. ? Change your dressing as told by your health care provider. May remove dressing and bathe or shower in 24 to 48 hours.  Keep site clean and dry and replace dressing with bandaid as needed. ? Leave stitches (sutures), skin glue, or adhesive strips in place. These skin closures may need to stay in place for 2 weeks or longer. If adhesive strip edges start to loosen and curl up, you may trim the loose edges. Do not remove adhesive strips completely unless your health care provider tells you to do that.  Check your port  insertion site every day for signs of infection. Check for: ? Redness, swelling, or pain. ? Fluid or blood. ? Warmth. ? Pus or a bad smell. Activity  Return to your normal activities as told by your health care provider. Ask your health care provider what activities are safe for you.  Do not lift anything that is heavier than 10 lb (4.5 kg), or the limit that you are told, until your health care provider says that it is safe. General instructions  Take over-the-counter and prescription medicines only as told by your health care provider.  Do not take baths, swim, or use a hot tub until your health care provider approves. Ask your health care provider if you may take showers. You may only be allowed to take sponge baths.  Do not drive for 24 hours if you were given a sedative during your procedure.  Wear a medical alert bracelet in case of an emergency. This will tell any health care providers that you have a port.  Keep all follow-up visits as told by your health care provider. This is important. Contact a health care provider if:  You cannot flush your port with saline as directed, or you cannot draw blood from the port.  You have a fever or chills.  You have redness, swelling, or pain around your port insertion site.  You have fluid or blood coming from your port insertion site.  Your port insertion site feels warm to the touch.  You have pus or a bad smell coming from the port insertion site. Get help right away if:  You have chest pain or shortness of breath.  You have bleeding from your port that you cannot control. Summary  Take care of the port as told by your health care provider. Keep the manufacturer's information card with you at all times.  Change your dressing as told by your health care provider.  Contact a health care provider if you have a fever or chills or if you have redness, swelling, or pain around your port insertion site.  Keep all follow-up  visits as told by your health care provider.  May start using emla cream (if ordered by your doctor) 2 weeks post procedure.    ICE pack for discomfort.  Have appointments set up for monthly port flushes.  Emergency after hours IR [phone] This information is not intended to replace advice given to you by your health care provider. Make sure you discuss any questions you have with your health care provider. Document Released: 10/28/2012 Document Revised: 08/05/2017 Document Reviewed: 08/05/2017 Elsevier Patient Education  [place].

## 2018-08-11 NOTE — Procedures (Signed)
Interventional Radiology Procedure Note  Procedure: Placement of a right IJ approach single lumen PowerPort.  Tip is positioned at the superior cavoatrial junction and catheter is ready for immediate use.  Complications: None Recommendations:  - Ok to shower tomorrow - Do not submerge for 7 days - Routine line care   Signed,  Davion Meara S. Tyan Lasure, DO   

## 2018-08-12 ENCOUNTER — Other Ambulatory Visit: Payer: Self-pay

## 2018-08-12 ENCOUNTER — Telehealth: Payer: Self-pay | Admitting: Oncology

## 2018-08-12 ENCOUNTER — Encounter: Payer: Managed Care, Other (non HMO) | Admitting: Obstetrics & Gynecology

## 2018-08-12 ENCOUNTER — Encounter: Payer: Self-pay | Admitting: Gynecologic Oncology

## 2018-08-12 ENCOUNTER — Inpatient Hospital Stay (HOSPITAL_BASED_OUTPATIENT_CLINIC_OR_DEPARTMENT_OTHER): Payer: Managed Care, Other (non HMO) | Admitting: Gynecologic Oncology

## 2018-08-12 ENCOUNTER — Ambulatory Visit: Admission: RE | Admit: 2018-08-12 | Payer: Managed Care, Other (non HMO) | Source: Ambulatory Visit

## 2018-08-12 DIAGNOSIS — C538 Malignant neoplasm of overlapping sites of cervix uteri: Secondary | ICD-10-CM | POA: Diagnosis not present

## 2018-08-12 LAB — CBC WITH DIFFERENTIAL/PLATELET
Abs Immature Granulocytes: 0.01 10*3/uL (ref 0.00–0.07)
Basophils Absolute: 0 10*3/uL (ref 0.0–0.1)
Basophils Relative: 0 %
Eosinophils Absolute: 0.1 10*3/uL (ref 0.0–0.5)
Eosinophils Relative: 3 %
HCT: 27.4 % — ABNORMAL LOW (ref 36.0–46.0)
Hemoglobin: 8.7 g/dL — ABNORMAL LOW (ref 12.0–15.0)
Immature Granulocytes: 0 %
Lymphocytes Relative: 11 %
Lymphs Abs: 0.3 10*3/uL — ABNORMAL LOW (ref 0.7–4.0)
MCH: 31.3 pg (ref 26.0–34.0)
MCHC: 31.8 g/dL (ref 30.0–36.0)
MCV: 98.6 fL (ref 80.0–100.0)
Monocytes Absolute: 0.4 10*3/uL (ref 0.1–1.0)
Monocytes Relative: 13 %
Neutro Abs: 2.3 10*3/uL (ref 1.7–7.7)
Neutrophils Relative %: 73 %
Platelets: 232 10*3/uL (ref 150–400)
RBC: 2.78 MIL/uL — ABNORMAL LOW (ref 3.87–5.11)
RDW: 14.8 % (ref 11.5–15.5)
WBC: 3.1 10*3/uL — ABNORMAL LOW (ref 4.0–10.5)
nRBC: 0 % (ref 0.0–0.2)

## 2018-08-12 NOTE — Telephone Encounter (Signed)
Jordan Waters called and said she is bleeding heavily with clots, is feeling dizzy and is wondering what to do.  Notified Dr. Sondra Come and appointment was scheduled with Dr. Denman George for this afternoon.  Called [name] back and said to come in as soon as she can to see Dr. Denman George.  She verbalized understanding and agreement and said her husband is on his way home to get her.

## 2018-08-12 NOTE — Progress Notes (Signed)
Follow-up Note: Gyn-Onc  Consult was initially requested by Dr. Penny Waters and Dr Jordan Waters for the evaluation of Jordan Waters 43 y.o. female  CC:  Chief Complaint  Patient presents with  . Cervical Cancer    vaginal bleeding    Assessment/Plan:  [name]  is a 43 y.o.  year old with stage IB3 cervical cancer. Acute blood loss anemia - recheck Hb and consider transfusion if <7.  Continue radiation - will plan on extrafascial hysterectomy after completion of treatment.  HPI: [name] is a 43 year old P2 who is seen in consultation at the request of Dr Jordan Waters for evaluation of a cervical mass and bleeding.  The patient reports the onset of heavy vaginal bleeding since Fathers' Day 2020. This resulted in syncopal feelings on 07/24/18 which led to her visiting the [facility] ER the same day. A TVUS was performed which showed a possible endometrial mass.  An Hb was drawn which showed severe acute blood loss anemia of 7.7mg /dL.  She was admitted, received 2 units PRBC (with an appropriate corresponding rise in Hb to 9.7) and then received an MRI on 07/26/18. This showed the mildly enlarged anteverted uterus measures 10.7 x 5.4 x 5.5 cm. There is a 4.7 x 3.4 x 3.0 cm poorly marginated infiltrative mass centered in the anterior left uterine cervix which extends 12:00 to 4:00 in the lower cervix, and extends 12:00 to 8:00 in the upper cervix, demonstrating intermediate T2 signal intensity and enhancement. This mass demonstrates cervical stromal invasion of at least 6 mm with no frank parametrial invasion. Mass appears to invade the lower most portion of the uterine cavity. No evidence of vaginal or bladder invasion. Findings are worrisome for a FIGO stage IB3 cervical carcinoma.  She is an otherwise very healthy woman with no chronic medical disease.  She has never had abdominal surgery.   She had regular pap tests up until a year or two ago. Her only abnormal pap was in pregnancy  approximately 20 years ago which resulted in what sounds like either a LEEP or CKC in pregnancy and subsequently normal paps after that time.   She has had 2 prior vaginal deliveries.  She has never been vaccinated against HPV.   Interval History: she was discharged from hospital on 07/27/18. Her bleeding has been slightly less today but she has had some abdominal distension. She reports daily bowel movements.   Biopsy of the cervix on 07/28/18 showed adenocarcinoma of the cervix.  PET showed no apparent involvement outside of the cervix and uterus.   She commenced primary chemoradiation on 08/04/18 (though chemotherapy was delayed due to lack of port access).   On August 11, 2018 she began bleeding heavily from the vagina and was seen by Dr.Kinard who establish hemostasis with Monsel's application.  She woke again on August 12, 2018 with ongoing vaginal bleeding and feeling weak and dizzy.     Current Meds:  Outpatient Encounter Medications as of 08/12/2018  Medication Sig  . Ascorbic Acid (VITAMIN C) 100 MG tablet Take 100 mg by mouth daily.  Marland Kitchen docusate sodium (COLACE) 100 MG capsule Take 1 capsule (100 mg total) by mouth 2 (two) times daily.  . DULoxetine (CYMBALTA) 60 MG capsule Take 1 capsule (60 mg total) by mouth daily.  . ferrous sulfate 325 (65 FE) MG tablet Take 1 tablet (325 mg total) by mouth 2 (two) times daily with a meal.  . gabapentin (NEURONTIN) 100 MG capsule Take 1 capsule (100  mg total) by mouth 3 (three) times daily.  Marland Kitchen lidocaine-prilocaine (EMLA) cream Apply to affected area once  . Multiple Vitamin (MULTIVITAMIN WITH MINERALS) TABS tablet Take 1 tablet by mouth daily.  . naproxen sodium (ALEVE) 220 MG tablet Take 220 mg by mouth daily as needed.  . ondansetron (ZOFRAN) 8 MG tablet Take 1 tablet (8 mg total) by mouth every 8 (eight) hours as needed.  . prochlorperazine (COMPAZINE) 10 MG tablet Take 1 tablet (10 mg total) by mouth every 6 (six) hours as needed (Nausea or  vomiting).   No facility-administered encounter medications on file as of 08/12/2018.     Allergy: No Known Allergies  Social Hx:   Social History   Socioeconomic History  . Marital status: Married    Spouse name: Leane Para  . Number of children: 3  . Years of education: 62  . Highest education level: Not on file  Occupational History  . Not on file  Social Needs  . Financial resource strain: Not on file  . Food insecurity    Worry: Not on file    Inability: Not on file  . Transportation needs    Medical: Not on file    Non-medical: Not on file  Tobacco Use  . Smoking status: Former Smoker    Types: Cigarettes    Quit date: 02/15/2018    Years since quitting: 0.4  . Smokeless tobacco: Never Used  Substance and Sexual Activity  . Alcohol use: Not Currently  . Drug use: Never  . Sexual activity: Yes  Lifestyle  . Physical activity    Days per week: Not on file    Minutes per session: Not on file  . Stress: Not on file  Relationships  . Social Herbalist on phone: Not on file    Gets together: Not on file    Attends religious service: Not on file    Active member of club or organization: Not on file    Attends meetings of clubs or organizations: Not on file    Relationship status: Not on file  . Intimate partner violence    Fear of current or ex partner: Not on file    Emotionally abused: Not on file    Physically abused: Not on file    Forced sexual activity: Not on file  Other Topics Concern  . Not on file  Social History Narrative   Lives with husband, children   Caffeine ,limited    Past Surgical Hx:  Past Surgical History:  Procedure Laterality Date  . IR IMAGING GUIDED PORT INSERTION  08/11/2018  . WISDOM TOOTH EXTRACTION      Past Medical Hx:  Past Medical History:  Diagnosis Date  . Depression with anxiety   . Migraine   . Pelvic mass in female 07/28/2018  . Photophobia of both eyes 04/08/2018    Past Gynecological History:  See HPI   No LMP recorded. Patient is postmenopausal.  Family Hx:  Family History  Problem Relation Age of Onset  . Heart disease Paternal Uncle   . Heart disease Paternal Grandfather   . Multiple sclerosis Mother   . Heart disease Father   . Multiple sclerosis Sister     Review of Systems:  Constitutional  Feels fatigued  ENT Normal appearing ears and nares bilaterally Skin/Breast  No rash, sores, jaundice, itching, dryness Cardiovascular  No chest pain, shortness of breath, or edema  Pulmonary  No cough or wheeze.  Gastro Intestinal  No nausea, vomitting, or diarrhoea. No bright red blood per rectum, no abdominal pain, change in bowel movement, or constipation.  Genito Urinary  No frequency, urgency, dysuria, + bleeding Musculo Skeletal  No myalgia, arthralgia, joint swelling or pain  Neurologic  No weakness, numbness, change in gait,  Psychology  No depression, anxiety, insomnia.   Vitals:  Blood pressure 110/64, pulse 90, temperature 98.3 F (36.8 C), temperature source Oral, resp. rate 16, height 5\' 1"  (1.549 m), weight 122 lb (55.3 kg), SpO2 100 %.  Physical Exam: WD in NAD Neck  Supple NROM, without any enlargements.  Lymph Node Survey No cervical supraclavicular or inguinal adenopathy Cardiovascular  Pulse normal rate, regularity and rhythm. S1 and S2 normal.  Lungs  Clear to auscultation bilateraly, without wheezes/crackles/rhonchi. Good air movement.  Skin  No rash/lesions/breakdown  Psychiatry  Alert and oriented to person, place, and time  Abdomen  Normoactive bowel sounds, abdomen soft, non-tender and thin without evidence of hernia.  Back No CVA tenderness Genito Urinary  Vulva/vagina: Normal external female genitalia.   No lesions. No discharge  Bladder/urethra:  No lesions or masses, well supported bladder  Vagina: well supported  Cervix: smooth ectocervix with no visible tumor. Os slightly open with faint sign of tissue within the os. No active  bleeding. Small volume of old thin watery blood in posterior vault. Monsels applied to cervix.   Uterus: deferred  Adnexa: deferred Rectal  deferred Extremities  No bilateral cyanosis, clubbing or edema.  Thereasa Solo, MD  08/12/2018, 4:48 PM

## 2018-08-12 NOTE — Patient Instructions (Signed)
There was no active bleeding from the cervix this afternoon however Dr Denman George applied some more monsels to the cervix to reinforce hemostasis.  There was no visible cancer on the surface of the cervix which suggests that the cancer is responding to radiation.  Dr Sondra Come will resume treatment tomorrow.  Dr Denman George is checking your hemoglobin and if it is very low she will recommend blood transfusion prior to your radiation.

## 2018-08-13 ENCOUNTER — Other Ambulatory Visit: Payer: Self-pay

## 2018-08-13 ENCOUNTER — Ambulatory Visit
Admission: RE | Admit: 2018-08-13 | Discharge: 2018-08-13 | Disposition: A | Discharge status: Home / Self Care | Payer: Managed Care, Other (non HMO) | Source: Ambulatory Visit | Attending: Radiation Oncology | Admitting: Radiation Oncology

## 2018-08-13 ENCOUNTER — Telehealth: Payer: Self-pay | Admitting: Oncology

## 2018-08-13 ENCOUNTER — Encounter: Payer: Self-pay | Admitting: Radiation Oncology

## 2018-08-13 DIAGNOSIS — C538 Malignant neoplasm of overlapping sites of cervix uteri: Secondary | ICD-10-CM

## 2018-08-13 LAB — CMP (CANCER CENTER ONLY)
ALT: 8 U/L (ref 0–44)
AST: 13 U/L — ABNORMAL LOW (ref 15–41)
Albumin: 3.7 g/dL (ref 3.5–5.0)
Alkaline Phosphatase: 53 U/L (ref 38–126)
Anion gap: 9 (ref 5–15)
BUN: 6 mg/dL (ref 6–20)
CO2: 24 mmol/L (ref 22–32)
Calcium: 9.5 mg/dL (ref 8.9–10.3)
Chloride: 106 mmol/L (ref 98–111)
Creatinine: 0.71 mg/dL (ref 0.44–1.00)
GFR, Est AFR Am: >60 mL/min (ref >60)
GFR, Estimated: >60 mL/min (ref >60)
Glucose, Bld: 100 mg/dL — ABNORMAL HIGH (ref 70–99)
Potassium: 3.9 mmol/L (ref 3.5–5.1)
Sodium: 139 mmol/L (ref 135–145)
Total Bilirubin: 0.3 mg/dL (ref 0.3–1.2)
Total Protein: 7.1 g/dL (ref 6.5–8.1)

## 2018-08-13 LAB — CBC WITH DIFFERENTIAL (CANCER CENTER ONLY)
Abs Immature Granulocytes: 0 10*3/uL (ref 0.00–0.07)
Basophils Absolute: 0 10*3/uL (ref 0.0–0.1)
Basophils Relative: 1 %
Eosinophils Absolute: 0.1 10*3/uL (ref 0.0–0.5)
Eosinophils Relative: 3 %
HCT: 30.1 % — ABNORMAL LOW (ref 36.0–46.0)
Hemoglobin: 9.7 g/dL — ABNORMAL LOW (ref 12.0–15.0)
Immature Granulocytes: 0 %
Lymphocytes Relative: 26 %
Lymphs Abs: 0.5 10*3/uL — ABNORMAL LOW (ref 0.7–4.0)
MCH: 30.8 pg (ref 26.0–34.0)
MCHC: 32.2 g/dL (ref 30.0–36.0)
MCV: 95.6 fL (ref 80.0–100.0)
Monocytes Absolute: 0.3 10*3/uL (ref 0.1–1.0)
Monocytes Relative: 15 %
Neutro Abs: 1.1 10*3/uL — ABNORMAL LOW (ref 1.7–7.7)
Neutrophils Relative %: 55 %
Platelet Count: 238 10*3/uL (ref 150–400)
RBC: 3.15 MIL/uL — ABNORMAL LOW (ref 3.87–5.11)
RDW: 14.6 % (ref 11.5–15.5)
WBC Count: 1.9 10*3/uL — ABNORMAL LOW (ref 4.0–10.5)
nRBC: 0 % (ref 0.0–0.2)

## 2018-08-13 MED ORDER — ONDANSETRON HCL 4 MG PO TABS
8.0000 mg | ORAL_TABLET | Freq: Once | ORAL | Status: AC
  Administered 2018-08-13: 8 mg via ORAL
  Filled 2018-08-13: qty 2

## 2018-08-13 NOTE — Telephone Encounter (Signed)
Called Jordan Waters and advised her of CBC results from yesterday with HGB of 8.7.  Discussed that she does not need blood per Dr. Alvy Bimler.  She said she is having nausea and asked if she can take Zofran or Compazine.  Advised her she can take either one for nausea.

## 2018-08-13 NOTE — Progress Notes (Signed)
  Radiation Oncology         [phone] ________________________________  Name: Jordan Waters MRN: [id number]  Date: 08/13/2018  DOB: [birth year]  Weekly Radiation Therapy Management  Diagnosis: Stage IB-3 Adenocarcinoma of the cervix  Current Dose: 16.2 Gy     Planned Dose:  45+ Gy  Narrative . . . . . . . . The patient presents for routine under treatment assessment.                                   Patient is seen again later this week.  She continues to have significant problems with nausea vomiting as well as diarrhea.  She has completed 9 of 25 planned external beam treatments.  The patient has not started radiosensitizing chemotherapy but plans are early next week.  Patient's vaginal bleeding has improved over the past 24 hours the second application of Monsel's by Dr. Denman George yesterday. she still complains of significant cramping..  Patient feels weak and dizzy temperature today is 97.8.  Pulse is 62 sitting and blood pressure 112/43 sitting.  Pulse is 83 standing blood pressure is 105/73 standing                                 Set-up films were reviewed.                                 The chart was checked. Physical Findings. . . The lungs are clear to auscultation.  The heart has a regular rhythm and rate.  The abdomen is slightly distended.  There is questionable rebound no guarding.   Plan . . . . . . . . . . . . Continue treatment as planned.  Patient was given 8 mg Zofran while in the department.  This helped with her nausea.  Patient will be set up to meet with a nutritionist early next week.  I recommended she undergo IV fluid supplementation tomorrow but  due to transportation difficulties the patient is unsure whether she could proceed with this recommendation.  She may need to be seen in symptom management..  Patient may need a stat abdominal and pelvic CT scan if she continues to feel poorly rule out possible bowel blockage although would not expect this issue with early stage  cervical cancer early during the course of her treatment.  ________________________________   Blair Promise, PhD, MD

## 2018-08-14 ENCOUNTER — Encounter: Payer: Self-pay | Admitting: Oncology

## 2018-08-14 ENCOUNTER — Ambulatory Visit
Admission: RE | Admit: 2018-08-14 | Discharge: 2018-08-14 | Disposition: A | Discharge status: Home / Self Care | Payer: Managed Care, Other (non HMO) | Source: Ambulatory Visit | Attending: Radiation Oncology | Admitting: Radiation Oncology

## 2018-08-14 DIAGNOSIS — C538 Malignant neoplasm of overlapping sites of cervix uteri: Secondary | ICD-10-CM | POA: Diagnosis not present

## 2018-08-14 LAB — MAGNESIUM: Magnesium: 1.8 mg/dL (ref 1.7–2.4)

## 2018-08-14 NOTE — Progress Notes (Signed)
Met with Jordan Waters after radiation today.  She said she continues to have nausea and diarrhea.  She is taking Zofran.  Discussed that she can also take compazine to see if that works better or that she can rotate taking both.  Advised her to call the on-call doctor or go to the ER over the weekend if she starts feeling worse.

## 2018-08-17 ENCOUNTER — Inpatient Hospital Stay (HOSPITAL_BASED_OUTPATIENT_CLINIC_OR_DEPARTMENT_OTHER): Payer: Managed Care, Other (non HMO) | Admitting: Hematology and Oncology

## 2018-08-17 ENCOUNTER — Inpatient Hospital Stay: Payer: Managed Care, Other (non HMO)

## 2018-08-17 ENCOUNTER — Encounter: Payer: Self-pay | Admitting: Hematology and Oncology

## 2018-08-17 ENCOUNTER — Other Ambulatory Visit: Payer: Self-pay

## 2018-08-17 ENCOUNTER — Encounter: Payer: Self-pay | Admitting: Oncology

## 2018-08-17 ENCOUNTER — Ambulatory Visit
Admission: RE | Admit: 2018-08-17 | Discharge: 2018-08-17 | Disposition: A | Discharge status: Home / Self Care | Payer: Managed Care, Other (non HMO) | Source: Ambulatory Visit | Attending: Radiation Oncology | Admitting: Radiation Oncology

## 2018-08-17 ENCOUNTER — Other Ambulatory Visit: Payer: Self-pay | Admitting: Oncology

## 2018-08-17 ENCOUNTER — Other Ambulatory Visit: Payer: Self-pay | Admitting: Hematology and Oncology

## 2018-08-17 ENCOUNTER — Telehealth: Payer: Self-pay | Admitting: Oncology

## 2018-08-17 VITALS — BP 109/67 | HR 88 | Temp 98.3°F | Resp 18

## 2018-08-17 DIAGNOSIS — R197 Diarrhea, unspecified: Secondary | ICD-10-CM | POA: Diagnosis not present

## 2018-08-17 DIAGNOSIS — C538 Malignant neoplasm of overlapping sites of cervix uteri: Secondary | ICD-10-CM

## 2018-08-17 DIAGNOSIS — R112 Nausea with vomiting, unspecified: Secondary | ICD-10-CM | POA: Diagnosis not present

## 2018-08-17 DIAGNOSIS — F418 Other specified anxiety disorders: Secondary | ICD-10-CM

## 2018-08-17 DIAGNOSIS — D61818 Other pancytopenia: Secondary | ICD-10-CM

## 2018-08-17 DIAGNOSIS — Z79899 Other long term (current) drug therapy: Secondary | ICD-10-CM

## 2018-08-17 DIAGNOSIS — Z87891 Personal history of nicotine dependence: Secondary | ICD-10-CM

## 2018-08-17 MED ORDER — POTASSIUM CHLORIDE 2 MEQ/ML IV SOLN
Freq: Once | INTRAVENOUS | Status: AC
  Administered 2018-08-17: 09:00:00 via INTRAVENOUS
  Filled 2018-08-17: qty 10

## 2018-08-17 MED ORDER — HEPARIN SOD (PORK) LOCK FLUSH 100 UNIT/ML IV SOLN
500.0000 [IU] | Freq: Once | INTRAVENOUS | Status: AC | PRN
  Administered 2018-08-17: 15:00:00 500 [IU]
  Filled 2018-08-17: qty 5

## 2018-08-17 MED ORDER — SODIUM CHLORIDE 0.9 % IV SOLN
Freq: Once | INTRAVENOUS | Status: AC
  Administered 2018-08-17: 09:00:00 via INTRAVENOUS
  Filled 2018-08-17: qty 250

## 2018-08-17 MED ORDER — PALONOSETRON HCL INJECTION 0.25 MG/5ML
0.2500 mg | Freq: Once | INTRAVENOUS | Status: AC
  Administered 2018-08-17: 0.25 mg via INTRAVENOUS

## 2018-08-17 MED ORDER — SODIUM CHLORIDE 0.9 % IV SOLN
40.0000 mg/m2 | Freq: Once | INTRAVENOUS | Status: AC
  Administered 2018-08-17: 63 mg via INTRAVENOUS
  Filled 2018-08-17: qty 63

## 2018-08-17 MED ORDER — DIPHENOXYLATE-ATROPINE 2.5-0.025 MG PO TABS: 1.0000 | ORAL_TABLET | Freq: Four times a day (QID) | ORAL | 9 refills | Status: DC | PRN

## 2018-08-17 MED ORDER — SODIUM CHLORIDE 0.9 % IV SOLN
Freq: Once | INTRAVENOUS | Status: AC
  Administered 2018-08-17: 10:00:00 via INTRAVENOUS
  Filled 2018-08-17: qty 5

## 2018-08-17 MED ORDER — SODIUM CHLORIDE 0.9% FLUSH
10.0000 mL | INTRAVENOUS | Status: DC | PRN
  Administered 2018-08-17: 10 mL
  Filled 2018-08-17: qty 10

## 2018-08-17 MED ORDER — PALONOSETRON HCL INJECTION 0.25 MG/5ML
INTRAVENOUS | Status: AC
  Filled 2018-08-17: qty 5

## 2018-08-17 NOTE — Patient Instructions (Signed)
[facility] Discharge Instructions for Patients Receiving Chemotherapy  Today you received the following chemotherapy agents: Cisplatin.  To help prevent nausea and vomiting after your treatment, we encourage you to take your nausea medication Compazine. May take Zofran 3 days after your treatment if needed starting 7/30 Thursday.   If you develop nausea and vomiting that is not controlled by your nausea medication, call the clinic.   BELOW ARE SYMPTOMS THAT SHOULD BE REPORTED IMMEDIATELY:  *FEVER GREATER THAN 100.5 F  *CHILLS WITH OR WITHOUT FEVER  NAUSEA AND VOMITING THAT IS NOT CONTROLLED WITH YOUR NAUSEA MEDICATION  *UNUSUAL SHORTNESS OF BREATH  *UNUSUAL BRUISING OR BLEEDING  TENDERNESS IN MOUTH AND THROAT WITH OR WITHOUT PRESENCE OF ULCERS  *URINARY PROBLEMS  *BOWEL PROBLEMS  UNUSUAL RASH Items with * indicate a potential emergency and should be followed up as soon as possible.  Feel free to call the clinic should you have any questions or concerns. The clinic phone number is [phone].  Please show the [facility] at check-in to the Emergency Department and triage nurse.

## 2018-08-17 NOTE — Progress Notes (Signed)
Okay to treat with ANC 1.1 on 7/23. Give premeds now for nausea, Per Dr. Alvy Bimler.

## 2018-08-17 NOTE — Assessment & Plan Note (Signed)
She is experiencing multiple side effects from radiation treatment We will start her with treatment as scheduled with aggressive supportive care

## 2018-08-17 NOTE — Progress Notes (Signed)
[place] OFFICE PROGRESS NOTE  Patient Care Team: Esaw Grandchild, NP as PCP - General (Family Medicine)  ASSESSMENT & PLAN:  Malignant neoplasm of overlapping sites of cervix [facility]) She is experiencing multiple side effects from radiation treatment We will start her with treatment as scheduled with aggressive supportive care  Pancytopenia, acquired [facility]) She has significant bleeding She did not need blood transfusion last week but we will continue to monitor closely Due to recent pancytopenia, she will also have additional G-CSF injection appointment added for the next 2 days I discussed the risk and benefits of G-CSF support and she agreed to proceed    Diarrhea She has severe diarrhea likely induced by recent radiation We discussed the additional use of Lomotil along with Imodium as needed I recommend IV fluid support but the patient declined  Nausea with vomiting She has severe nausea and vomiting even before chemotherapy is started We discussed the use of antiemetics We discussed IV fluids and IV anti-emetics support but the patient declined   No orders of the defined types were placed in this encounter.   INTERVAL HISTORY: Please see below for problem oriented charting. She is seen in the infusion room, due to start cycle 1 of therapy Last week, she can planes of severe menorrhagia She was evaluated by GYN oncologist Blood count check shows severe pancytopenia but she did not need transfusion support She continues to have significant menorrhagia In addition, she had nausea and vomiting last week along with severe diarrhea more than 10 times per day despite scheduled dosing of Imodium The patient felt that she is able to hydrate adequately She was only taking Zofran for nausea and omitting Compazine because she was concerned about sedating side effects from Compazine She denies fever or chills  SUMMARY OF ONCOLOGIC HISTORY: Oncology History   Malignant neoplasm of overlapping sites of cervix ([place])  07/26/2018 Imaging   MR pelvis 1. Poorly marginated/infiltrative 4.7 x 3.4 x 3.0 cm mass of the left uterine cervix with invasion of the cervical stroma, worrisome for FIGO stage IB3 cervical carcinoma. No frank parametrial, bladder or vaginal involvement. GYN ONC consultation suggested. 2. No pelvic adenopathy. 3. No suspicious ovarian or adnexal findings.   07/28/2018 Initial Diagnosis   Malignant neoplasm of overlapping sites of cervix [facility])   07/28/2018 Pathology Results   1. Cervix, biopsy, ectocervix - ADENOCARCINOMA. - SEE MICROSCOPIC DESCRIPTION 2. Endocervix, curettage - ADENOCARCINOMA. - SEE MICROSCOPIC DESCRIPTION Microscopic Comment 1. -2. Both of the specimens show adenocarcinoma with similar features and the morphology favors endocervical primary. An endometrial primary cannot be completely excluded.   08/10/2018 Procedure   Status post right IJ port catheter placement. Catheter ready for use.     REVIEW OF SYSTEMS:   Constitutional: Denies fevers, chills or abnormal weight loss Eyes: Denies blurriness of vision Ears, nose, mouth, throat, and face: Denies mucositis or sore throat Respiratory: Denies cough, dyspnea or wheezes Cardiovascular: Denies palpitation, chest discomfort or lower extremity swelling Skin: Denies abnormal skin rashes Lymphatics: Denies new lymphadenopathy or easy bruising Neurological:Denies numbness, tingling or new weaknesses Behavioral/Psych: Mood is stable, no new changes  All other systems were reviewed with the patient and are negative.  I have reviewed the past medical history, past surgical history, social history and family history with the patient and they are unchanged from previous note.  ALLERGIES:  has No Known Allergies.  MEDICATIONS:  Current Outpatient Medications  Medication Sig Dispense Refill  . loperamide (IMODIUM A-D) 2  MG tablet Take 2 mg by mouth 4 (four) times  daily as needed.    . Ascorbic Acid (VITAMIN C) 100 MG tablet Take 100 mg by mouth daily.    . diphenoxylate-atropine (LOMOTIL) 2.5-0.025 MG tablet Take 1 tablet by mouth 4 (four) times daily as needed for diarrhea or loose stools. 60 tablet 9  . docusate sodium (COLACE) 100 MG capsule Take 1 capsule (100 mg total) by mouth 2 (two) times daily. 60 capsule 1  . DULoxetine (CYMBALTA) 60 MG capsule Take 1 capsule (60 mg total) by mouth daily. 30 capsule 3  . ferrous sulfate 325 (65 FE) MG tablet Take 1 tablet (325 mg total) by mouth 2 (two) times daily with a meal. 60 tablet 3  . gabapentin (NEURONTIN) 100 MG capsule Take 1 capsule (100 mg total) by mouth 3 (three) times daily. 270 capsule 0  . lidocaine-prilocaine (EMLA) cream Apply to affected area once 30 g 3  . Multiple Vitamin (MULTIVITAMIN WITH MINERALS) TABS tablet Take 1 tablet by mouth daily.    . ondansetron (ZOFRAN) 8 MG tablet Take 1 tablet (8 mg total) by mouth every 8 (eight) hours as needed. 30 tablet 1  . prochlorperazine (COMPAZINE) 10 MG tablet Take 1 tablet (10 mg total) by mouth every 6 (six) hours as needed (Nausea or vomiting). 30 tablet 1   No current facility-administered medications for this visit.    Facility-Administered Medications Ordered in Other Visits  Medication Dose Route Frequency Provider Last Rate Last Dose  . CISplatin (PLATINOL) 63 mg in sodium chloride 0.9 % 250 mL chemo infusion  40 mg/m2 (Treatment Plan Recorded) Intravenous Once Alvy Bimler, Taysia Rivere, MD      . heparin lock flush 100 unit/mL  500 Units Intracatheter Once PRN Alvy Bimler, Kisha Messman, MD      . sodium chloride flush (NS) 0.9 % injection 10 mL  10 mL Intracatheter PRN Alvy Bimler, Damon Hargrove, MD        PHYSICAL EXAMINATION: ECOG PERFORMANCE STATUS: 2 - Symptomatic, <50% confined to bed She looks pale, thin and mildly cachectic GENERAL:alert, no distress and comfortable SKIN: skin color, texture, turgor are normal, no rashes or significant lesions EYES: normal,  Conjunctiva are pink and non-injected, sclera clear OROPHARYNX:no exudate, no erythema and lips, buccal mucosa, and tongue normal  NECK: supple, thyroid normal size, non-tender, without nodularity LYMPH:  no palpable lymphadenopathy in the cervical, axillary or inguinal LUNGS: clear to auscultation and percussion with normal breathing effort HEART: regular rate & rhythm and no murmurs and no lower extremity edema ABDOMEN:abdomen soft, non-tender and normal bowel sounds Musculoskeletal:no cyanosis of digits and no clubbing  NEURO: alert & oriented x 3 with fluent speech, no focal motor/sensory deficits  LABORATORY DATA:  I have reviewed the data as listed    Component Value Date/Time   NA 139 08/13/2018 1614   NA 140 03/20/2018 1237   K 3.9 08/13/2018 1614   CL 106 08/13/2018 1614   CO2 24 08/13/2018 1614   GLUCOSE 100 (H) 08/13/2018 1614   BUN 6 08/13/2018 1614   BUN 7 03/20/2018 1237   CREATININE 0.71 08/13/2018 1614   CALCIUM 9.5 08/13/2018 1614   PROT 7.1 08/13/2018 1614   PROT 7.0 03/20/2018 1237   ALBUMIN 3.7 08/13/2018 1614   ALBUMIN 3.8 04/08/2018 1641   AST 13 (L) 08/13/2018 1614   ALT 8 08/13/2018 1614   ALKPHOS 53 08/13/2018 1614   BILITOT 0.3 08/13/2018 1614   GFRNONAA >60 08/13/2018 1614   GFRAA >60  08/13/2018 1614    No results found for: SPEP, UPEP  Lab Results  Component Value Date   WBC 1.9 (L) 08/13/2018   NEUTROABS 1.1 (L) 08/13/2018   HGB 9.7 (L) 08/13/2018   HCT 30.1 (L) 08/13/2018   MCV 95.6 08/13/2018   PLT 238 08/13/2018      Chemistry      Component Value Date/Time   NA 139 08/13/2018 1614   NA 140 03/20/2018 1237   K 3.9 08/13/2018 1614   CL 106 08/13/2018 1614   CO2 24 08/13/2018 1614   BUN 6 08/13/2018 1614   BUN 7 03/20/2018 1237   CREATININE 0.71 08/13/2018 1614      Component Value Date/Time   CALCIUM 9.5 08/13/2018 1614   ALKPHOS 53 08/13/2018 1614   AST 13 (L) 08/13/2018 1614   ALT 8 08/13/2018 1614   BILITOT 0.3  08/13/2018 1614       RADIOGRAPHIC STUDIES: I have personally reviewed the radiological images as listed and agreed with the findings in the report. Mr Pelvis W Wo Contrast  Result Date: 07/26/2018 CLINICAL DATA:  43 year old female inpatient with abnormal uterine bleeding with abnormal pelvic ultrasound. EXAM: MRI PELVIS WITHOUT AND WITH CONTRAST TECHNIQUE: Multiplanar multisequence MR imaging of the pelvis was performed both before and after administration of intravenous contrast. CONTRAST:  5.6 cc Gadavist IV. COMPARISON:  07/25/2018 pelvic sonogram. FINDINGS: Urinary Tract:  Normal bladder.  Normal urethra. Bowel: Visualized small and large bowel are normal caliber with no bowel wall thickening. Vascular/Lymphatic: No pathologically enlarged lymph nodes in the pelvis. No acute vascular abnormality. Reproductive: Uterus: The mildly enlarged anteverted uterus measures 10.7 x 5.4 x 5.5 cm. No uterine fibroids. Inner myometrium (junctional zone) measures 15 mm in thickness, abnormally thickened, compatible with diffuse uterine adenomyosis. Endometrium measures 9 mm in bilayer thickness. There is a 4.7 x 3.4 x 3.0 cm poorly marginated infiltrative mass centered in the anterior left uterine cervix (series 2/image 11, series 4/image 18, series 21/image 58), which extends 12:00 to 4:00 in the lower cervix, and extends 12:00 to 8:00 in the upper cervix, demonstrating intermediate T2 signal intensity and enhancement. This mass demonstrates cervical stromal invasion of at least 6 mm (series 3/image 19), with no frank parametrial invasion. Mass appears to invade the lower most portion of the uterine cavity. No evidence of vaginal or bladder invasion. Findings are worrisome for a FIGO stage IB3 cervical carcinoma. Ovaries and Adnexa: The right ovary measures 3.1 x 2.0 x 1.1 cm and is normal. The left ovary measures 3.5 x 2.7 x 2.9 cm and contains a simple 2.8 x 2.1 x 2.1 cm cyst. There are no suspicious ovarian or  adnexal masses. Other: No abnormal free fluid in the pelvis. No focal pelvic fluid collection. Musculoskeletal: No aggressive appearing focal osseous lesions. IMPRESSION: 1. Poorly marginated/infiltrative 4.7 x 3.4 x 3.0 cm mass of the left uterine cervix with invasion of the cervical stroma, worrisome for FIGO stage IB3 cervical carcinoma. No frank parametrial, bladder or vaginal involvement. GYN ONC consultation suggested. 2. No pelvic adenopathy. 3. No suspicious ovarian or adnexal findings. Electronically Signed   By: Ilona Sorrel M.D.   On: 07/26/2018 18:01   Nm Pet Image Initial (pi) Skull Base To Thigh  Result Date: 08/03/2018 CLINICAL DATA:  Initial treatment strategy for cervical cancer. EXAM: NUCLEAR MEDICINE PET SKULL BASE TO THIGH TECHNIQUE: 6.2 mCi F-18 FDG was injected intravenously. Full-ring PET imaging was performed from the skull base to thigh after the  radiotracer. CT data was obtained and used for attenuation correction and anatomic localization. Fasting blood glucose: 84 mg/dl COMPARISON:  None. FINDINGS: Mediastinal blood pool activity: SUV max 1.8 Liver activity: SUV max NA NECK: No hypermetabolic lymph nodes in the neck. Symmetric uptake in the oropharynx bilaterally is nonspecific. Incidental CT findings: none CHEST: No hypermetabolic mediastinal or hilar nodes.2 mm right lower lobe nodule visible on 33/3. No overtly suspicious nodule or mass. Incidental CT findings: None. ABDOMEN/PELVIS: Cervical mass is markedly hypermetabolic with SUV max = 98.3. No hypermetabolic pelvic sidewall lymphadenopathy. No evidence for hypermetabolic metastatic disease in the abdomen/pelvis. Incidental CT findings: Tiny calcified gallstones evident. There is abdominal aortic atherosclerosis without aneurysm. Trace intraperitoneal free fluid evident. SKELETON: No focal hypermetabolic activity to suggest skeletal metastasis. Incidental CT findings: none IMPRESSION: 1. Hypermetabolic cervical mass compatible  with known neoplasm. No evidence for hypermetabolic metastatic disease in the neck, chest, abdomen, or pelvis. Electronically Signed   By: Misty Stanley M.D.   On: 08/03/2018 13:18   US Pelvic Complete With Transvaginal  Result Date: 07/25/2018 CLINICAL DATA:  Abnormal uterine bleeding for a month. EXAM: TRANSABDOMINAL AND TRANSVAGINAL ULTRASOUND OF PELVIS TECHNIQUE: Both transabdominal and transvaginal ultrasound examinations of the pelvis were performed. Transabdominal technique was performed for global imaging of the pelvis including uterus, ovaries, adnexal regions, and pelvic cul-de-sac. It was necessary to proceed with endovaginal exam following the transabdominal exam to visualize the endometrium and ovaries. COMPARISON:  None FINDINGS: Uterus Measurements: 9.4 x 4.2 x 5.7 cm = volume: 118.4 mL. No fibroids or other mass visualized. Endometrium The endometrium measures 3.8 mm in thickness at the fundus. However, there is a masslike appearance to the endometrium in the uterine body and lower uterine segment measuring 3.2 by 7.9 x 2.8 cm with internal blood flow. There is also fluid in the endometrial canal, likely blood given history. Right ovary Measurements: 2.5 x 1.2 x 1.3 cm = volume: 2 mL. Normal appearance/no adnexal mass. Left ovary Measurements: 3.5 x 2 x 3.1 cm = volume: 11.2 mL. Contains a 2.6 cm follicle. Other findings There is a small amount of free fluid in the pelvis, likely physiologic. IMPRESSION: 1. The endometrium has a masslike appearance in the uterine body and lower uterine segment measuring 3.2 x 7.9 x 2.8 cm. There is also fluid in the endometrial canal, likely blood products given history. The constellation of findings are concerning for an endometrial neoplasm. Recommend gynecologic consultation with endometrial biopsy. 2. No other abnormalities. Electronically Signed   By: Dorise Bullion III M.D   On: 07/25/2018 18:13   Ir Imaging Guided Port Insertion  Result Date:  08/11/2018 INDICATION: 43 year old female with a history of cervical carcinoma referred for port catheter placement EXAM: IMPLANTED PORT A CATH PLACEMENT WITH ULTRASOUND AND FLUOROSCOPIC GUIDANCE MEDICATIONS: 2 g Ancef; The antibiotic was administered within an appropriate time interval prior to skin puncture. ANESTHESIA/SEDATION: Moderate (conscious) sedation was employed during this procedure. A total of Versed 3.0 mg and Fentanyl 100 mcg was administered intravenously. Moderate Sedation Time: 21 minutes. The patient's level of consciousness and vital signs were monitored continuously by radiology nursing throughout the procedure under my direct supervision. FLUOROSCOPY TIME:  0 minutes, 6 seconds (0 mGy) COMPLICATIONS: None PROCEDURE: The procedure, risks, benefits, and alternatives were explained to the patient. Questions regarding the procedure were encouraged and answered. The patient understands and consents to the procedure. Ultrasound survey was performed with images stored and sent to PACs. The right neck and chest was  prepped with chlorhexidine, and draped in the usual sterile fashion using maximum barrier technique (cap and mask, sterile gown, sterile gloves, large sterile sheet, hand hygiene and cutaneous antiseptic). Antibiotic prophylaxis was provided with 2.0g Ancef administered IV one hour prior to skin incision. Local anesthesia was attained by infiltration with 1% lidocaine without epinephrine. Ultrasound demonstrated patency of the right internal jugular vein, and this was documented with an image. Under real-time ultrasound guidance, this vein was accessed with a 21 gauge micropuncture needle and image documentation was performed. A small dermatotomy was made at the access site with an 11 scalpel. A 0.018" wire was advanced into the SVC and used to estimate the length of the internal catheter. The access needle exchanged for a 13F micropuncture vascular sheath. The 0.018" wire was then removed  and a 0.035" wire advanced into the IVC. An appropriate location for the subcutaneous reservoir was selected below the clavicle and an incision was made through the skin and underlying soft tissues. The subcutaneous tissues were then dissected using a combination of blunt and sharp surgical technique and a pocket was formed. A single lumen power injectable portacatheter was then tunneled through the subcutaneous tissues from the pocket to the dermatotomy and the port reservoir placed within the subcutaneous pocket. The venous access site was then serially dilated and a peel away vascular sheath placed over the wire. The wire was removed and the port catheter advanced into position under fluoroscopic guidance. The catheter tip is positioned in the cavoatrial junction. This was documented with a spot image. The portacatheter was then tested and found to flush and aspirate well. The port was flushed with saline followed by 100 units/mL heparinized saline. The pocket was then closed in two layers using first subdermal inverted interrupted absorbable sutures followed by a running subcuticular suture. The epidermis was then sealed with Dermabond. The dermatotomy at the venous access site was also seal with Dermabond. Patient tolerated the procedure well and remained hemodynamically stable throughout. No complications encountered and no significant blood loss encountered IMPRESSION: Status post right IJ port catheter placement. Catheter ready for use. Signed, Dulcy Fanny. Dellia Nims, RPVI [facility] Radiology Electronically Signed   By: Corrie Mckusick D.O.   On: 08/11/2018 13:54    All questions were answered. The patient knows to call the clinic with any problems, questions or concerns. No barriers to learning was detected.  I spent 30 minutes counseling the patient face to face. The total time spent in the appointment was 40 minutes and more than 50% was on counseling and  review of test results  Heath Lark, MD 08/17/2018 11:39 AM

## 2018-08-17 NOTE — Assessment & Plan Note (Signed)
She has significant bleeding She did not need blood transfusion last week but we will continue to monitor closely Due to recent pancytopenia, she will also have additional G-CSF injection appointment added for the next 2 days I discussed the risk and benefits of G-CSF support and she agreed to proceed

## 2018-08-17 NOTE — Assessment & Plan Note (Signed)
She has severe nausea and vomiting even before chemotherapy is started We discussed the use of antiemetics We discussed IV fluids and IV anti-emetics support but the patient declined

## 2018-08-17 NOTE — Assessment & Plan Note (Signed)
She has severe diarrhea likely induced by recent radiation We discussed the additional use of Lomotil along with Imodium as needed I recommend IV fluid support but the patient declined

## 2018-08-17 NOTE — Telephone Encounter (Signed)
Left a message for Ernestene Kiel, Dietician to see if she can meet with patient today in infusion.

## 2018-08-18 ENCOUNTER — Other Ambulatory Visit: Payer: Self-pay

## 2018-08-18 ENCOUNTER — Inpatient Hospital Stay: Payer: Managed Care, Other (non HMO)

## 2018-08-18 ENCOUNTER — Telehealth: Payer: Self-pay | Admitting: *Deleted

## 2018-08-18 ENCOUNTER — Ambulatory Visit
Admission: RE | Admit: 2018-08-18 | Discharge: 2018-08-18 | Disposition: A | Discharge status: Home / Self Care | Payer: Managed Care, Other (non HMO) | Source: Ambulatory Visit | Attending: Radiation Oncology | Admitting: Radiation Oncology

## 2018-08-18 ENCOUNTER — Telehealth: Payer: Self-pay | Admitting: Oncology

## 2018-08-18 VITALS — BP 102/53 | Temp 97.9°F | Resp 18

## 2018-08-18 DIAGNOSIS — C538 Malignant neoplasm of overlapping sites of cervix uteri: Secondary | ICD-10-CM | POA: Diagnosis not present

## 2018-08-18 MED ORDER — FILGRASTIM-SNDZ 300 MCG/0.5ML IJ SOSY: 300.0000 ug | PREFILLED_SYRINGE | Freq: Once | INTRAMUSCULAR | Status: DC

## 2018-08-18 NOTE — Telephone Encounter (Signed)
Rennae left a message this morning saying that she is feeling "100 times better" since getting the premeds yesterday.  She said she is not feeling dizzy and has not gotten sick or had diarrhea since.  She has also been able to eat.

## 2018-08-18 NOTE — Telephone Encounter (Signed)
-----   Message from Flo Shanks, RN sent at 08/17/2018  2:42 PM EDT ----- Regarding: Dr. Alvy Bimler First time chemo First time Cisplatin. Tolerated with no complaints.

## 2018-08-18 NOTE — Telephone Encounter (Signed)
TCT patient to follow up with her after her first treatment of cisplatin yesterday. No answer but was able to leave vm message for pt to call back with any questions or concerns about her treatment yesterday.

## 2018-08-18 NOTE — Progress Notes (Signed)
PT arrived and was not checked in by front desk, pharmacy staff had left for the day and PT wasn't able to receive medication. Made Melanie ADON aware and she tried to contact Dr Alvy Bimler and there was no answer. Informed PT that she would need to come back tomorrow and that we would touch base with physician as well.

## 2018-08-19 ENCOUNTER — Ambulatory Visit
Admission: RE | Admit: 2018-08-19 | Discharge: 2018-08-19 | Disposition: A | Discharge status: Home / Self Care | Payer: Managed Care, Other (non HMO) | Source: Ambulatory Visit | Attending: Radiation Oncology | Admitting: Radiation Oncology

## 2018-08-19 ENCOUNTER — Inpatient Hospital Stay: Payer: Managed Care, Other (non HMO)

## 2018-08-19 ENCOUNTER — Other Ambulatory Visit: Payer: Self-pay

## 2018-08-19 DIAGNOSIS — C538 Malignant neoplasm of overlapping sites of cervix uteri: Secondary | ICD-10-CM

## 2018-08-19 MED ORDER — FILGRASTIM-SNDZ 300 MCG/0.5ML IJ SOSY
300.0000 ug | PREFILLED_SYRINGE | Freq: Once | INTRAMUSCULAR | Status: AC
  Administered 2018-08-19: 300 ug via SUBCUTANEOUS
  Filled 2018-08-19: qty 0.5

## 2018-08-19 NOTE — Patient Instructions (Signed)

## 2018-08-20 ENCOUNTER — Ambulatory Visit
Admission: RE | Admit: 2018-08-20 | Discharge: 2018-08-20 | Disposition: A | Discharge status: Home / Self Care | Payer: Managed Care, Other (non HMO) | Source: Ambulatory Visit | Attending: Radiation Oncology | Admitting: Radiation Oncology

## 2018-08-20 ENCOUNTER — Other Ambulatory Visit: Payer: Managed Care, Other (non HMO)

## 2018-08-20 ENCOUNTER — Inpatient Hospital Stay: Payer: Managed Care, Other (non HMO)

## 2018-08-20 ENCOUNTER — Other Ambulatory Visit: Payer: Self-pay

## 2018-08-20 ENCOUNTER — Ambulatory Visit: Payer: Managed Care, Other (non HMO)

## 2018-08-20 VITALS — BP 105/47 | HR 94 | Temp 98.4°F | Resp 18

## 2018-08-20 DIAGNOSIS — C538 Malignant neoplasm of overlapping sites of cervix uteri: Secondary | ICD-10-CM | POA: Diagnosis not present

## 2018-08-20 MED ORDER — FILGRASTIM-SNDZ 300 MCG/0.5ML IJ SOSY
300.0000 ug | PREFILLED_SYRINGE | Freq: Once | INTRAMUSCULAR | Status: AC
  Administered 2018-08-20: 16:00:00 300 ug via SUBCUTANEOUS
  Filled 2018-08-20: qty 0.5

## 2018-08-21 ENCOUNTER — Other Ambulatory Visit: Payer: Self-pay

## 2018-08-21 ENCOUNTER — Ambulatory Visit: Payer: Managed Care, Other (non HMO)

## 2018-08-21 ENCOUNTER — Inpatient Hospital Stay: Payer: Managed Care, Other (non HMO)

## 2018-08-21 DIAGNOSIS — C538 Malignant neoplasm of overlapping sites of cervix uteri: Secondary | ICD-10-CM | POA: Diagnosis not present

## 2018-08-21 DIAGNOSIS — D649 Anemia, unspecified: Secondary | ICD-10-CM

## 2018-08-21 LAB — COMPREHENSIVE METABOLIC PANEL
ALT: 14 U/L (ref 0–44)
AST: 15 U/L (ref 15–41)
Albumin: 3.7 g/dL (ref 3.5–5.0)
Alkaline Phosphatase: 74 U/L (ref 38–126)
Anion gap: 8 (ref 5–15)
BUN: 8 mg/dL (ref 6–20)
CO2: 26 mmol/L (ref 22–32)
Calcium: 9.3 mg/dL (ref 8.9–10.3)
Chloride: 104 mmol/L (ref 98–111)
Creatinine, Ser: 0.74 mg/dL (ref 0.44–1.00)
GFR calc Af Amer: >60 mL/min (ref >60)
GFR calc non Af Amer: >60 mL/min (ref >60)
Glucose, Bld: 106 mg/dL — ABNORMAL HIGH (ref 70–99)
Potassium: 4 mmol/L (ref 3.5–5.1)
Sodium: 138 mmol/L (ref 135–145)
Total Bilirubin: 0.2 mg/dL — ABNORMAL LOW (ref 0.3–1.2)
Total Protein: 6.6 g/dL (ref 6.5–8.1)

## 2018-08-21 LAB — CBC WITH DIFFERENTIAL/PLATELET
Abs Immature Granulocytes: 1.08 10*3/uL — ABNORMAL HIGH (ref 0.00–0.07)
Basophils Absolute: 0 10*3/uL (ref 0.0–0.1)
Basophils Relative: 0 %
Eosinophils Absolute: 0.2 10*3/uL (ref 0.0–0.5)
Eosinophils Relative: 1 %
HCT: 30.1 % — ABNORMAL LOW (ref 36.0–46.0)
Hemoglobin: 9.8 g/dL — ABNORMAL LOW (ref 12.0–15.0)
Immature Granulocytes: 6 %
Lymphocytes Relative: 4 %
Lymphs Abs: 0.7 10*3/uL (ref 0.7–4.0)
MCH: 30.7 pg (ref 26.0–34.0)
MCHC: 32.6 g/dL (ref 30.0–36.0)
MCV: 94.4 fL (ref 80.0–100.0)
Monocytes Absolute: 0.8 10*3/uL (ref 0.1–1.0)
Monocytes Relative: 4 %
Neutro Abs: 16.5 10*3/uL — ABNORMAL HIGH (ref 1.7–7.7)
Neutrophils Relative %: 85 %
Platelets: 232 10*3/uL (ref 150–400)
RBC: 3.19 MIL/uL — ABNORMAL LOW (ref 3.87–5.11)
RDW: 14.1 % (ref 11.5–15.5)
WBC: 19.3 10*3/uL — ABNORMAL HIGH (ref 4.0–10.5)
nRBC: 0 % (ref 0.0–0.2)

## 2018-08-21 LAB — SAMPLE TO BLOOD BANK

## 2018-08-21 LAB — MAGNESIUM: Magnesium: 2 mg/dL (ref 1.7–2.4)

## 2018-08-24 ENCOUNTER — Inpatient Hospital Stay: Payer: Managed Care, Other (non HMO) | Admitting: Nutrition

## 2018-08-24 ENCOUNTER — Inpatient Hospital Stay (HOSPITAL_BASED_OUTPATIENT_CLINIC_OR_DEPARTMENT_OTHER): Payer: Managed Care, Other (non HMO) | Admitting: Hematology and Oncology

## 2018-08-24 ENCOUNTER — Ambulatory Visit
Admission: RE | Admit: 2018-08-24 | Discharge: 2018-08-24 | Disposition: A | Discharge status: Home / Self Care | Payer: Managed Care, Other (non HMO) | Source: Ambulatory Visit | Attending: Radiation Oncology | Admitting: Radiation Oncology

## 2018-08-24 ENCOUNTER — Inpatient Hospital Stay: Payer: Managed Care, Other (non HMO)

## 2018-08-24 ENCOUNTER — Encounter: Payer: Self-pay | Admitting: Oncology

## 2018-08-24 ENCOUNTER — Other Ambulatory Visit: Payer: Self-pay

## 2018-08-24 ENCOUNTER — Encounter: Payer: Self-pay | Admitting: Hematology and Oncology

## 2018-08-24 VITALS — BP 111/68 | HR 80 | Temp 98.5°F | Resp 18 | Wt 117.2 lb

## 2018-08-24 DIAGNOSIS — Z79899 Other long term (current) drug therapy: Secondary | ICD-10-CM | POA: Insufficient documentation

## 2018-08-24 DIAGNOSIS — R531 Weakness: Secondary | ICD-10-CM | POA: Insufficient documentation

## 2018-08-24 DIAGNOSIS — D61818 Other pancytopenia: Secondary | ICD-10-CM | POA: Insufficient documentation

## 2018-08-24 DIAGNOSIS — C538 Malignant neoplasm of overlapping sites of cervix uteri: Secondary | ICD-10-CM | POA: Insufficient documentation

## 2018-08-24 DIAGNOSIS — R197 Diarrhea, unspecified: Secondary | ICD-10-CM | POA: Insufficient documentation

## 2018-08-24 DIAGNOSIS — F418 Other specified anxiety disorders: Secondary | ICD-10-CM | POA: Insufficient documentation

## 2018-08-24 DIAGNOSIS — R42 Dizziness and giddiness: Secondary | ICD-10-CM | POA: Diagnosis not present

## 2018-08-24 DIAGNOSIS — Z87891 Personal history of nicotine dependence: Secondary | ICD-10-CM | POA: Insufficient documentation

## 2018-08-24 DIAGNOSIS — Z7689 Persons encountering health services in other specified circumstances: Secondary | ICD-10-CM | POA: Diagnosis not present

## 2018-08-24 DIAGNOSIS — D62 Acute posthemorrhagic anemia: Secondary | ICD-10-CM | POA: Insufficient documentation

## 2018-08-24 DIAGNOSIS — R140 Abdominal distension (gaseous): Secondary | ICD-10-CM | POA: Diagnosis not present

## 2018-08-24 DIAGNOSIS — R112 Nausea with vomiting, unspecified: Secondary | ICD-10-CM | POA: Insufficient documentation

## 2018-08-24 DIAGNOSIS — Z5111 Encounter for antineoplastic chemotherapy: Secondary | ICD-10-CM | POA: Insufficient documentation

## 2018-08-24 DIAGNOSIS — Z510 Encounter for antineoplastic radiation therapy: Secondary | ICD-10-CM | POA: Diagnosis not present

## 2018-08-24 MED ORDER — SODIUM CHLORIDE 0.9 % IV SOLN
Freq: Once | INTRAVENOUS | Status: AC
  Administered 2018-08-24: 11:00:00 via INTRAVENOUS
  Filled 2018-08-24: qty 5

## 2018-08-24 MED ORDER — PALONOSETRON HCL INJECTION 0.25 MG/5ML
0.2500 mg | Freq: Once | INTRAVENOUS | Status: AC
  Administered 2018-08-24: 11:00:00 0.25 mg via INTRAVENOUS

## 2018-08-24 MED ORDER — SODIUM CHLORIDE 0.9 % IV SOLN
Freq: Once | INTRAVENOUS | Status: AC
  Administered 2018-08-24: 09:00:00 via INTRAVENOUS
  Filled 2018-08-24: qty 250

## 2018-08-24 MED ORDER — HEPARIN SOD (PORK) LOCK FLUSH 100 UNIT/ML IV SOLN
500.0000 [IU] | Freq: Once | INTRAVENOUS | Status: AC | PRN
  Administered 2018-08-24: 16:00:00 500 [IU]
  Filled 2018-08-24: qty 5

## 2018-08-24 MED ORDER — PALONOSETRON HCL INJECTION 0.25 MG/5ML
INTRAVENOUS | Status: AC
  Filled 2018-08-24: qty 5

## 2018-08-24 MED ORDER — POTASSIUM CHLORIDE 2 MEQ/ML IV SOLN
Freq: Once | INTRAVENOUS | Status: AC
  Administered 2018-08-24: 09:00:00 via INTRAVENOUS
  Filled 2018-08-24: qty 10

## 2018-08-24 MED ORDER — SODIUM CHLORIDE 0.9 % IV SOLN
40.0000 mg/m2 | Freq: Once | INTRAVENOUS | Status: AC
  Administered 2018-08-24: 13:00:00 63 mg via INTRAVENOUS
  Filled 2018-08-24: qty 63

## 2018-08-24 MED ORDER — SODIUM CHLORIDE 0.9% FLUSH
10.0000 mL | INTRAVENOUS | Status: DC | PRN
  Administered 2018-08-24: 16:00:00 10 mL
  Filled 2018-08-24: qty 10

## 2018-08-24 NOTE — Progress Notes (Signed)
43 year old female diagnosed with Cervical cancer. She is a patient of Dr. Alvy Bimler.  PMH includes Migraine, Depression, Anxiety.  Medications include Immodium, Vit C, Lomotil, Colace, Ferrous sulfate, MVI, Zofran, Compazine.  Labs were reviewed.  Height: 61 inches. Weight: 117. 25 pounds BMI: 22.15.  Patient reports N, V, and D and taste alterations. She is receiving concurrent chemoradiation. She has been trying to follow a Vegan diet but is not opposed to trying some animal products and following a vegetarian diet if that is better tolerated. She has been using ginger to help with Nausea.  Nutrition Diagnosis: Unintended weight loss related to cervical cancer and associated treatments as evidenced by 7% weight loss since July 6. (significant)  Intervention:  Educated patient on low fiber diet to improve diarrhea. Provided vegetarian options of foods for patient to introduce if she chooses. Educated patient on strategies for eating to improve nausea and vomiting. Encouraged anti-emetics per MD. Suggested Aromatherapy and made referral to RN who is certified. Gave samples, fact sheets, contact information and coupons. Questions answered and teach back method used.  Monitoring, Evaluation, Goals: Patient will increase oral intake to minimize weight loss.  Next Visit: Monday, August 17, during infusion.  **Disclaimer: This note was dictated with voice recognition software. Similar sounding words can inadvertently be transcribed and this note may contain transcription errors which may not have been corrected upon publication of note.**

## 2018-08-24 NOTE — Progress Notes (Signed)
Met with Jordan Waters in the infusion room.  Ok to cancel Dr. Virginia Crews appointment per [facility] since in undergoing chemotherapy/radiation.

## 2018-08-24 NOTE — Progress Notes (Signed)
[facility] OFFICE PROGRESS NOTE  Patient Care Team: Esaw Grandchild, NP as PCP - General (Family Medicine)  ASSESSMENT & PLAN:  Malignant neoplasm of overlapping sites of cervix ([place]) Clinically, she is responding well to treatment with clinical improvement She has no further vaginal bleeding Her blood counts are satisfactory We will continue treatment with supportive care  Pancytopenia, acquired ([place]) Her white count has improved due to recent G-CSF support Her hemoglobin has improved She does not need transfusion support  Diarrhea She continues to have frequent diarrhea I advised her to continue taking Imodium and Lomotil  Nausea with vomiting She continues to have profound nausea and vomiting The patient has declined taking regular antiemetics She would like to try conservative approach with dietary change I have advised her to call me if she felt profoundly dehydrated or require IV fluids or IV antiemetics   No orders of the defined types were placed in this encounter.   INTERVAL HISTORY: Please see below for problem oriented charting. She is seen in the infusion room She is doing well She has no further vaginal bleeding since last week She continues to have nausea and vomiting She has not taken regular schedule antiemetics as advised She would like to try certain diet to manage her symptoms She continues to have significant diarrhea Denies recent fever or chills She skipped her radiation treatment last week and felt much better  SUMMARY OF ONCOLOGIC HISTORY: Oncology History  Malignant neoplasm of overlapping sites of cervix ([place])  07/26/2018 Imaging   MR pelvis 1. Poorly marginated/infiltrative 4.7 x 3.4 x 3.0 cm mass of the left uterine cervix with invasion of the cervical stroma, worrisome for FIGO stage IB3 cervical carcinoma. No frank parametrial, bladder or vaginal involvement. GYN ONC consultation suggested. 2. No pelvic adenopathy. 3. No  suspicious ovarian or adnexal findings.   07/28/2018 Initial Diagnosis   Malignant neoplasm of overlapping sites of cervix [facility])   07/28/2018 Pathology Results   1. Cervix, biopsy, ectocervix - ADENOCARCINOMA. - SEE MICROSCOPIC DESCRIPTION 2. Endocervix, curettage - ADENOCARCINOMA. - SEE MICROSCOPIC DESCRIPTION Microscopic Comment 1. -2. Both of the specimens show adenocarcinoma with similar features and the morphology favors endocervical primary. An endometrial primary cannot be completely excluded.   08/10/2018 Procedure   Status post right IJ port catheter placement. Catheter ready for use.     REVIEW OF SYSTEMS:   Constitutional: Denies fevers, chills or abnormal weight loss Eyes: Denies blurriness of vision Ears, nose, mouth, throat, and face: Denies mucositis or sore throat Respiratory: Denies cough, dyspnea or wheezes Cardiovascular: Denies palpitation, chest discomfort or lower extremity swelling Skin: Denies abnormal skin rashes Lymphatics: Denies new lymphadenopathy or easy bruising Neurological:Denies numbness, tingling or new weaknesses Behavioral/Psych: Mood is stable, no new changes  All other systems were reviewed with the patient and are negative.  I have reviewed the past medical history, past surgical history, social history and family history with the patient and they are unchanged from previous note.  ALLERGIES:  has No Known Allergies.  MEDICATIONS:  Current Outpatient Medications  Medication Sig Dispense Refill  . Ascorbic Acid (VITAMIN C) 100 MG tablet Take 100 mg by mouth daily.    . diphenoxylate-atropine (LOMOTIL) 2.5-0.025 MG tablet Take 1 tablet by mouth 4 (four) times daily as needed for diarrhea or loose stools. 60 tablet 9  . docusate sodium (COLACE) 100 MG capsule Take 1 capsule (100 mg total) by mouth 2 (two) times daily. 60 capsule 1  . DULoxetine (  CYMBALTA) 60 MG capsule Take 1 capsule (60 mg total) by mouth daily. 30 capsule 3  . ferrous  sulfate 325 (65 FE) MG tablet Take 1 tablet (325 mg total) by mouth 2 (two) times daily with a meal. 60 tablet 3  . gabapentin (NEURONTIN) 100 MG capsule Take 1 capsule (100 mg total) by mouth 3 (three) times daily. 270 capsule 0  . lidocaine-prilocaine (EMLA) cream Apply to affected area once 30 g 3  . loperamide (IMODIUM A-D) 2 MG tablet Take 2 mg by mouth 4 (four) times daily as needed.    . Multiple Vitamin (MULTIVITAMIN WITH MINERALS) TABS tablet Take 1 tablet by mouth daily.    . ondansetron (ZOFRAN) 8 MG tablet Take 1 tablet (8 mg total) by mouth every 8 (eight) hours as needed. 30 tablet 1  . prochlorperazine (COMPAZINE) 10 MG tablet Take 1 tablet (10 mg total) by mouth every 6 (six) hours as needed (Nausea or vomiting). 30 tablet 1   No current facility-administered medications for this visit.    Facility-Administered Medications Ordered in Other Visits  Medication Dose Route Frequency Provider Last Rate Last Dose  . CISplatin (PLATINOL) 63 mg in sodium chloride 0.9 % 250 mL chemo infusion  40 mg/m2 (Treatment Plan Recorded) Intravenous Once Alvy Bimler, Finch Costanzo, MD      . heparin lock flush 100 unit/mL  500 Units Intracatheter Once PRN Alvy Bimler, Sasha Rogel, MD      . sodium chloride flush (NS) 0.9 % injection 10 mL  10 mL Intracatheter PRN Alliah Boulanger, MD        PHYSICAL EXAMINATION: ECOG PERFORMANCE STATUS: 1 - Symptomatic but completely ambulatory  There were no vitals filed for this visit. There were no vitals filed for this visit.  GENERAL:alert, no distress and comfortable SKIN: skin color, texture, turgor are normal, no rashes or significant lesions EYES: normal, Conjunctiva are pink and non-injected, sclera clear OROPHARYNX:no exudate, no erythema and lips, buccal mucosa, and tongue normal  NECK: supple, thyroid normal size, non-tender, without nodularity LYMPH:  no palpable lymphadenopathy in the cervical, axillary or inguinal LUNGS: clear to auscultation and percussion with normal  breathing effort HEART: regular rate & rhythm and no murmurs and no lower extremity edema ABDOMEN:abdomen soft, non-tender and normal bowel sounds Musculoskeletal:no cyanosis of digits and no clubbing  NEURO: alert & oriented x 3 with fluent speech, no focal motor/sensory deficits  LABORATORY DATA:  I have reviewed the data as listed    Component Value Date/Time   NA 138 08/21/2018 1459   NA 140 03/20/2018 1237   K 4.0 08/21/2018 1459   CL 104 08/21/2018 1459   CO2 26 08/21/2018 1459   GLUCOSE 106 (H) 08/21/2018 1459   BUN 8 08/21/2018 1459   BUN 7 03/20/2018 1237   CREATININE 0.74 08/21/2018 1459   CREATININE 0.71 08/13/2018 1614   CALCIUM 9.3 08/21/2018 1459   PROT 6.6 08/21/2018 1459   PROT 7.0 03/20/2018 1237   ALBUMIN 3.7 08/21/2018 1459   ALBUMIN 3.8 04/08/2018 1641   AST 15 08/21/2018 1459   AST 13 (L) 08/13/2018 1614   ALT 14 08/21/2018 1459   ALT 8 08/13/2018 1614   ALKPHOS 74 08/21/2018 1459   BILITOT 0.2 (L) 08/21/2018 1459   BILITOT 0.3 08/13/2018 1614   GFRNONAA >60 08/21/2018 1459   GFRNONAA >60 08/13/2018 1614   GFRAA >60 08/21/2018 1459   GFRAA >60 08/13/2018 1614    No results found for: SPEP, UPEP  Lab Results  Component  Value Date   WBC 19.3 (H) 08/21/2018   NEUTROABS 16.5 (H) 08/21/2018   HGB 9.8 (L) 08/21/2018   HCT 30.1 (L) 08/21/2018   MCV 94.4 08/21/2018   PLT 232 08/21/2018      Chemistry      Component Value Date/Time   NA 138 08/21/2018 1459   NA 140 03/20/2018 1237   K 4.0 08/21/2018 1459   CL 104 08/21/2018 1459   CO2 26 08/21/2018 1459   BUN 8 08/21/2018 1459   BUN 7 03/20/2018 1237   CREATININE 0.74 08/21/2018 1459   CREATININE 0.71 08/13/2018 1614      Component Value Date/Time   CALCIUM 9.3 08/21/2018 1459   ALKPHOS 74 08/21/2018 1459   AST 15 08/21/2018 1459   AST 13 (L) 08/13/2018 1614   ALT 14 08/21/2018 1459   ALT 8 08/13/2018 1614   BILITOT 0.2 (L) 08/21/2018 1459   BILITOT 0.3 08/13/2018 1614        RADIOGRAPHIC STUDIES: I have personally reviewed the radiological images as listed and agreed with the findings in the report. Mr Pelvis W Wo Contrast  Result Date: 07/26/2018 CLINICAL DATA:  43 year old female inpatient with abnormal uterine bleeding with abnormal pelvic ultrasound. EXAM: MRI PELVIS WITHOUT AND WITH CONTRAST TECHNIQUE: Multiplanar multisequence MR imaging of the pelvis was performed both before and after administration of intravenous contrast. CONTRAST:  5.6 cc Gadavist IV. COMPARISON:  07/25/2018 pelvic sonogram. FINDINGS: Urinary Tract:  Normal bladder.  Normal urethra. Bowel: Visualized small and large bowel are normal caliber with no bowel wall thickening. Vascular/Lymphatic: No pathologically enlarged lymph nodes in the pelvis. No acute vascular abnormality. Reproductive: Uterus: The mildly enlarged anteverted uterus measures 10.7 x 5.4 x 5.5 cm. No uterine fibroids. Inner myometrium (junctional zone) measures 15 mm in thickness, abnormally thickened, compatible with diffuse uterine adenomyosis. Endometrium measures 9 mm in bilayer thickness. There is a 4.7 x 3.4 x 3.0 cm poorly marginated infiltrative mass centered in the anterior left uterine cervix (series 2/image 11, series 4/image 18, series 21/image 58), which extends 12:00 to 4:00 in the lower cervix, and extends 12:00 to 8:00 in the upper cervix, demonstrating intermediate T2 signal intensity and enhancement. This mass demonstrates cervical stromal invasion of at least 6 mm (series 3/image 19), with no frank parametrial invasion. Mass appears to invade the lower most portion of the uterine cavity. No evidence of vaginal or bladder invasion. Findings are worrisome for a FIGO stage IB3 cervical carcinoma. Ovaries and Adnexa: The right ovary measures 3.1 x 2.0 x 1.1 cm and is normal. The left ovary measures 3.5 x 2.7 x 2.9 cm and contains a simple 2.8 x 2.1 x 2.1 cm cyst. There are no suspicious ovarian or adnexal masses.  Other: No abnormal free fluid in the pelvis. No focal pelvic fluid collection. Musculoskeletal: No aggressive appearing focal osseous lesions. IMPRESSION: 1. Poorly marginated/infiltrative 4.7 x 3.4 x 3.0 cm mass of the left uterine cervix with invasion of the cervical stroma, worrisome for FIGO stage IB3 cervical carcinoma. No frank parametrial, bladder or vaginal involvement. GYN ONC consultation suggested. 2. No pelvic adenopathy. 3. No suspicious ovarian or adnexal findings. Electronically Signed   By: Ilona Sorrel M.D.   On: 07/26/2018 18:01   Nm Pet Image Initial (pi) Skull Base To Thigh  Result Date: 08/03/2018 CLINICAL DATA:  Initial treatment strategy for cervical cancer. EXAM: NUCLEAR MEDICINE PET SKULL BASE TO THIGH TECHNIQUE: 6.2 mCi F-18 FDG was injected intravenously. Full-ring PET imaging was  performed from the skull base to thigh after the radiotracer. CT data was obtained and used for attenuation correction and anatomic localization. Fasting blood glucose: 84 mg/dl COMPARISON:  None. FINDINGS: Mediastinal blood pool activity: SUV max 1.8 Liver activity: SUV max NA NECK: No hypermetabolic lymph nodes in the neck. Symmetric uptake in the oropharynx bilaterally is nonspecific. Incidental CT findings: none CHEST: No hypermetabolic mediastinal or hilar nodes.2 mm right lower lobe nodule visible on 33/3. No overtly suspicious nodule or mass. Incidental CT findings: None. ABDOMEN/PELVIS: Cervical mass is markedly hypermetabolic with SUV max = 41.7. No hypermetabolic pelvic sidewall lymphadenopathy. No evidence for hypermetabolic metastatic disease in the abdomen/pelvis. Incidental CT findings: Tiny calcified gallstones evident. There is abdominal aortic atherosclerosis without aneurysm. Trace intraperitoneal free fluid evident. SKELETON: No focal hypermetabolic activity to suggest skeletal metastasis. Incidental CT findings: none IMPRESSION: 1. Hypermetabolic cervical mass compatible with known  neoplasm. No evidence for hypermetabolic metastatic disease in the neck, chest, abdomen, or pelvis. Electronically Signed   By: Misty Stanley M.D.   On: 08/03/2018 13:18   US Pelvic Complete With Transvaginal  Result Date: 07/25/2018 CLINICAL DATA:  Abnormal uterine bleeding for a month. EXAM: TRANSABDOMINAL AND TRANSVAGINAL ULTRASOUND OF PELVIS TECHNIQUE: Both transabdominal and transvaginal ultrasound examinations of the pelvis were performed. Transabdominal technique was performed for global imaging of the pelvis including uterus, ovaries, adnexal regions, and pelvic cul-de-sac. It was necessary to proceed with endovaginal exam following the transabdominal exam to visualize the endometrium and ovaries. COMPARISON:  None FINDINGS: Uterus Measurements: 9.4 x 4.2 x 5.7 cm = volume: 118.4 mL. No fibroids or other mass visualized. Endometrium The endometrium measures 3.8 mm in thickness at the fundus. However, there is a masslike appearance to the endometrium in the uterine body and lower uterine segment measuring 3.2 by 7.9 x 2.8 cm with internal blood flow. There is also fluid in the endometrial canal, likely blood given history. Right ovary Measurements: 2.5 x 1.2 x 1.3 cm = volume: 2 mL. Normal appearance/no adnexal mass. Left ovary Measurements: 3.5 x 2 x 3.1 cm = volume: 11.2 mL. Contains a 2.6 cm follicle. Other findings There is a small amount of free fluid in the pelvis, likely physiologic. IMPRESSION: 1. The endometrium has a masslike appearance in the uterine body and lower uterine segment measuring 3.2 x 7.9 x 2.8 cm. There is also fluid in the endometrial canal, likely blood products given history. The constellation of findings are concerning for an endometrial neoplasm. Recommend gynecologic consultation with endometrial biopsy. 2. No other abnormalities. Electronically Signed   By: Dorise Bullion III M.D   On: 07/25/2018 18:13   Ir Imaging Guided Port Insertion  Result Date:  08/11/2018 INDICATION: 43 year old female with a history of cervical carcinoma referred for port catheter placement EXAM: IMPLANTED PORT A CATH PLACEMENT WITH ULTRASOUND AND FLUOROSCOPIC GUIDANCE MEDICATIONS: 2 g Ancef; The antibiotic was administered within an appropriate time interval prior to skin puncture. ANESTHESIA/SEDATION: Moderate (conscious) sedation was employed during this procedure. A total of Versed 3.0 mg and Fentanyl 100 mcg was administered intravenously. Moderate Sedation Time: 21 minutes. The patient's level of consciousness and vital signs were monitored continuously by radiology nursing throughout the procedure under my direct supervision. FLUOROSCOPY TIME:  0 minutes, 6 seconds (0 mGy) COMPLICATIONS: None PROCEDURE: The procedure, risks, benefits, and alternatives were explained to the patient. Questions regarding the procedure were encouraged and answered. The patient understands and consents to the procedure. Ultrasound survey was performed with images stored and  sent to PACs. The right neck and chest was prepped with chlorhexidine, and draped in the usual sterile fashion using maximum barrier technique (cap and mask, sterile gown, sterile gloves, large sterile sheet, hand hygiene and cutaneous antiseptic). Antibiotic prophylaxis was provided with 2.0g Ancef administered IV one hour prior to skin incision. Local anesthesia was attained by infiltration with 1% lidocaine without epinephrine. Ultrasound demonstrated patency of the right internal jugular vein, and this was documented with an image. Under real-time ultrasound guidance, this vein was accessed with a 21 gauge micropuncture needle and image documentation was performed. A small dermatotomy was made at the access site with an 11 scalpel. A 0.018" wire was advanced into the SVC and used to estimate the length of the internal catheter. The access needle exchanged for a 6F micropuncture vascular sheath. The 0.018" wire was then removed  and a 0.035" wire advanced into the IVC. An appropriate location for the subcutaneous reservoir was selected below the clavicle and an incision was made through the skin and underlying soft tissues. The subcutaneous tissues were then dissected using a combination of blunt and sharp surgical technique and a pocket was formed. A single lumen power injectable portacatheter was then tunneled through the subcutaneous tissues from the pocket to the dermatotomy and the port reservoir placed within the subcutaneous pocket. The venous access site was then serially dilated and a peel away vascular sheath placed over the wire. The wire was removed and the port catheter advanced into position under fluoroscopic guidance. The catheter tip is positioned in the cavoatrial junction. This was documented with a spot image. The portacatheter was then tested and found to flush and aspirate well. The port was flushed with saline followed by 100 units/mL heparinized saline. The pocket was then closed in two layers using first subdermal inverted interrupted absorbable sutures followed by a running subcuticular suture. The epidermis was then sealed with Dermabond. The dermatotomy at the venous access site was also seal with Dermabond. Patient tolerated the procedure well and remained hemodynamically stable throughout. No complications encountered and no significant blood loss encountered IMPRESSION: Status post right IJ port catheter placement. Catheter ready for use. Signed, Dulcy Fanny. Dellia Nims, RPVI [facility] Electronically Signed   By: Corrie Mckusick D.O.   On: 08/11/2018 13:54    All questions were answered. The patient knows to call the clinic with any problems, questions or concerns. No barriers to learning was detected.  I spent 15 minutes counseling the patient face to face. The total time spent in the appointment was 20 minutes and more than 50% was on counseling and  review of test results  Heath Lark, MD 08/24/2018 12:41 PM

## 2018-08-24 NOTE — Progress Notes (Signed)
Clinical Aromatherapy- Pt requested use of aromatherapy in the clinic today .  Pt has no allergies or sensitives to essential oils . Pt uses them in her home surroundings in  addition to [place]. She prefers fruit aromas and frankincense. I  instructed her in proper use of orange- ginger nasal inhalation for nausea. She demonstrated proper use. I also gave her Peace Blend ( lavender ,frankincense and peppermint) to use for anxiousness. Pt appreciated this information .

## 2018-08-24 NOTE — Patient Instructions (Signed)
[facility] Discharge Instructions for Patients Receiving Chemotherapy  Today you received the following chemotherapy agents Cisplatin (PLATINOL).  To help prevent nausea and vomiting after your treatment, we encourage you to take your nausea medication as prescribed.   If you develop nausea and vomiting that is not controlled by your nausea medication, call the clinic.   BELOW ARE SYMPTOMS THAT SHOULD BE REPORTED IMMEDIATELY:  *FEVER GREATER THAN 100.5 F  *CHILLS WITH OR WITHOUT FEVER  NAUSEA AND VOMITING THAT IS NOT CONTROLLED WITH YOUR NAUSEA MEDICATION  *UNUSUAL SHORTNESS OF BREATH  *UNUSUAL BRUISING OR BLEEDING  TENDERNESS IN MOUTH AND THROAT WITH OR WITHOUT PRESENCE OF ULCERS  *URINARY PROBLEMS  *BOWEL PROBLEMS  UNUSUAL RASH Items with * indicate a potential emergency and should be followed up as soon as possible.  Feel free to call the clinic should you have any questions or concerns. The clinic phone number is [phone].  Please show the [place] at check-in to the Emergency Department and triage nurse.  Coronavirus (COVID-19) Are you at risk?  Are you at risk for the Coronavirus (COVID-19)?  To be considered HIGH RISK for Coronavirus (COVID-19), you have to meet the following criteria:  . Traveled to [place], [place], [place], [place] or [place]; or in the [place] to [place], [place], [place], or [place]; and have fever, cough, and shortness of breath within the last 2 weeks of travel OR . Been in close contact with a person diagnosed with COVID-19 within the last 2 weeks and have fever, cough, and shortness of breath . IF YOU DO NOT MEET THESE CRITERIA, YOU ARE CONSIDERED LOW RISK FOR COVID-19.  What to do if you are HIGH RISK for COVID-19?  Marland Kitchen If you are having a medical emergency, call 911. . Seek medical care right away. Before you go to a doctor's office, urgent care or emergency department, call ahead and tell them  about your recent travel, contact with someone diagnosed with COVID-19, and your symptoms. You should receive instructions from your physician's office regarding next steps of care.  . When you arrive at healthcare provider, tell the healthcare staff immediately you have returned from visiting [place], [place], [place], [place] or [place]; or traveled in the [place] to [place], [place], [place], or [place]; in the last two weeks or you have been in close contact with a person diagnosed with COVID-19 in the last 2 weeks.   . Tell the health care staff about your symptoms: fever, cough and shortness of breath. . After you have been seen by a medical provider, you will be either: o Tested for (COVID-19) and discharged home on quarantine except to seek medical care if symptoms worsen, and asked to  - Stay home and avoid contact with others until you get your results (4-5 days)  - Avoid travel on public transportation if possible (such as bus, train, or airplane) or o Sent to the Emergency Department by EMS for evaluation, COVID-19 testing, and possible admission depending on your condition and test results.  What to do if you are LOW RISK for COVID-19?  Reduce your risk of any infection by using the same precautions used for avoiding the common cold or flu:  Marland Kitchen Wash your hands often with soap and warm water for at least 20 seconds.  If soap and water are not readily available, use an alcohol-based hand sanitizer with at least 60% alcohol.  . If coughing or  sneezing, cover your mouth and nose by coughing or sneezing into the elbow areas of your shirt or coat, into a tissue or into your sleeve (not your hands). . Avoid shaking hands with others and consider head nods or verbal greetings only. . Avoid touching your eyes, nose, or mouth with unwashed hands.  . Avoid close contact with people who are sick. . Avoid places or events with large numbers of people in one location, like concerts or  sporting events. . Carefully consider travel plans you have or are making. . If you are planning any travel outside or inside the [place], visit the CDC's Travelers' Health webpage for the latest health notices. . If you have some symptoms but not all symptoms, continue to monitor at home and seek medical attention if your symptoms worsen. . If you are having a medical emergency, call 911.   [place] / e-Visit: [email or web address]         [facility]: [facility]: [phone]                   [facility]: [phone] \

## 2018-08-24 NOTE — Assessment & Plan Note (Signed)
She continues to have frequent diarrhea I advised her to continue taking Imodium and Lomotil

## 2018-08-24 NOTE — Assessment & Plan Note (Signed)
Clinically, she is responding well to treatment with clinical improvement She has no further vaginal bleeding Her blood counts are satisfactory We will continue treatment with supportive care

## 2018-08-24 NOTE — Assessment & Plan Note (Signed)
She continues to have profound nausea and vomiting The patient has declined taking regular antiemetics She would like to try conservative approach with dietary change I have advised her to call me if she felt profoundly dehydrated or require IV fluids or IV antiemetics

## 2018-08-24 NOTE — Assessment & Plan Note (Signed)
Her white count has improved due to recent G-CSF support Her hemoglobin has improved She does not need transfusion support

## 2018-08-25 ENCOUNTER — Ambulatory Visit
Admission: RE | Admit: 2018-08-25 | Discharge: 2018-08-25 | Disposition: A | Discharge status: Home / Self Care | Payer: Managed Care, Other (non HMO) | Source: Ambulatory Visit | Attending: Radiation Oncology | Admitting: Radiation Oncology

## 2018-08-25 ENCOUNTER — Other Ambulatory Visit: Payer: Self-pay

## 2018-08-25 DIAGNOSIS — C538 Malignant neoplasm of overlapping sites of cervix uteri: Secondary | ICD-10-CM | POA: Diagnosis not present

## 2018-08-26 ENCOUNTER — Ambulatory Visit
Admission: RE | Admit: 2018-08-26 | Discharge: 2018-08-26 | Disposition: A | Discharge status: Home / Self Care | Payer: Managed Care, Other (non HMO) | Source: Ambulatory Visit | Attending: Radiation Oncology | Admitting: Radiation Oncology

## 2018-08-26 DIAGNOSIS — C538 Malignant neoplasm of overlapping sites of cervix uteri: Secondary | ICD-10-CM | POA: Diagnosis not present

## 2018-08-27 ENCOUNTER — Other Ambulatory Visit: Payer: Self-pay

## 2018-08-27 ENCOUNTER — Ambulatory Visit
Admission: RE | Admit: 2018-08-27 | Discharge: 2018-08-27 | Disposition: A | Discharge status: Home / Self Care | Payer: Managed Care, Other (non HMO) | Source: Ambulatory Visit | Attending: Radiation Oncology | Admitting: Radiation Oncology

## 2018-08-27 ENCOUNTER — Inpatient Hospital Stay: Payer: Managed Care, Other (non HMO)

## 2018-08-27 DIAGNOSIS — D649 Anemia, unspecified: Secondary | ICD-10-CM

## 2018-08-27 DIAGNOSIS — C538 Malignant neoplasm of overlapping sites of cervix uteri: Secondary | ICD-10-CM | POA: Diagnosis not present

## 2018-08-27 LAB — COMPREHENSIVE METABOLIC PANEL
ALT: 14 U/L (ref 0–44)
AST: 13 U/L — ABNORMAL LOW (ref 15–41)
Albumin: 3.7 g/dL (ref 3.5–5.0)
Alkaline Phosphatase: 50 U/L (ref 38–126)
Anion gap: 10 (ref 5–15)
BUN: 9 mg/dL (ref 6–20)
CO2: 25 mmol/L (ref 22–32)
Calcium: 9 mg/dL (ref 8.9–10.3)
Chloride: 103 mmol/L (ref 98–111)
Creatinine, Ser: 0.75 mg/dL (ref 0.44–1.00)
GFR calc Af Amer: >60 mL/min (ref >60)
GFR calc non Af Amer: >60 mL/min (ref >60)
Glucose, Bld: 108 mg/dL — ABNORMAL HIGH (ref 70–99)
Potassium: 4.1 mmol/L (ref 3.5–5.1)
Sodium: 138 mmol/L (ref 135–145)
Total Bilirubin: 0.3 mg/dL (ref 0.3–1.2)
Total Protein: 6.4 g/dL — ABNORMAL LOW (ref 6.5–8.1)

## 2018-08-27 LAB — CBC WITH DIFFERENTIAL/PLATELET
Abs Immature Granulocytes: 0.01 10*3/uL (ref 0.00–0.07)
Basophils Absolute: 0 10*3/uL (ref 0.0–0.1)
Basophils Relative: 0 %
Eosinophils Absolute: 0 10*3/uL (ref 0.0–0.5)
Eosinophils Relative: 0 %
HCT: 28.7 % — ABNORMAL LOW (ref 36.0–46.0)
Hemoglobin: 9.2 g/dL — ABNORMAL LOW (ref 12.0–15.0)
Immature Granulocytes: 0 %
Lymphocytes Relative: 11 %
Lymphs Abs: 0.2 10*3/uL — ABNORMAL LOW (ref 0.7–4.0)
MCH: 30.6 pg (ref 26.0–34.0)
MCHC: 32.1 g/dL (ref 30.0–36.0)
MCV: 95.3 fL (ref 80.0–100.0)
Monocytes Absolute: 0.3 10*3/uL (ref 0.1–1.0)
Monocytes Relative: 14 %
Neutro Abs: 1.7 10*3/uL (ref 1.7–7.7)
Neutrophils Relative %: 75 %
Platelets: 195 10*3/uL (ref 150–400)
RBC: 3.01 MIL/uL — ABNORMAL LOW (ref 3.87–5.11)
RDW: 14.2 % (ref 11.5–15.5)
WBC: 2.3 10*3/uL — ABNORMAL LOW (ref 4.0–10.5)
nRBC: 0 % (ref 0.0–0.2)

## 2018-08-27 LAB — SAMPLE TO BLOOD BANK

## 2018-08-27 LAB — MAGNESIUM: Magnesium: 2 mg/dL (ref 1.7–2.4)

## 2018-08-28 ENCOUNTER — Ambulatory Visit: Payer: Managed Care, Other (non HMO)

## 2018-08-28 ENCOUNTER — Other Ambulatory Visit: Payer: Self-pay | Admitting: Adult Health

## 2018-08-31 ENCOUNTER — Telehealth: Payer: Self-pay

## 2018-08-31 ENCOUNTER — Inpatient Hospital Stay (HOSPITAL_BASED_OUTPATIENT_CLINIC_OR_DEPARTMENT_OTHER): Payer: Managed Care, Other (non HMO) | Admitting: Hematology and Oncology

## 2018-08-31 ENCOUNTER — Other Ambulatory Visit: Payer: Self-pay

## 2018-08-31 ENCOUNTER — Ambulatory Visit
Admission: RE | Admit: 2018-08-31 | Discharge: 2018-08-31 | Disposition: A | Discharge status: Home / Self Care | Payer: Managed Care, Other (non HMO) | Source: Ambulatory Visit | Attending: Radiation Oncology | Admitting: Radiation Oncology

## 2018-08-31 ENCOUNTER — Inpatient Hospital Stay: Payer: Managed Care, Other (non HMO)

## 2018-08-31 ENCOUNTER — Telehealth: Payer: Self-pay | Admitting: Hematology and Oncology

## 2018-08-31 VITALS — BP 105/52 | HR 84 | Temp 99.1°F | Resp 16 | Wt 115.0 lb

## 2018-08-31 DIAGNOSIS — R112 Nausea with vomiting, unspecified: Secondary | ICD-10-CM | POA: Diagnosis not present

## 2018-08-31 DIAGNOSIS — C538 Malignant neoplasm of overlapping sites of cervix uteri: Secondary | ICD-10-CM

## 2018-08-31 DIAGNOSIS — R197 Diarrhea, unspecified: Secondary | ICD-10-CM

## 2018-08-31 DIAGNOSIS — D61818 Other pancytopenia: Secondary | ICD-10-CM | POA: Diagnosis not present

## 2018-08-31 MED ORDER — SODIUM CHLORIDE 0.9 % IV SOLN
Freq: Once | INTRAVENOUS | Status: AC
  Administered 2018-08-31: 09:00:00 via INTRAVENOUS
  Filled 2018-08-31: qty 250

## 2018-08-31 MED ORDER — PALONOSETRON HCL INJECTION 0.25 MG/5ML
INTRAVENOUS | Status: AC
  Filled 2018-08-31: qty 5

## 2018-08-31 MED ORDER — SODIUM CHLORIDE 0.9% FLUSH
10.0000 mL | INTRAVENOUS | Status: DC | PRN
  Administered 2018-08-31: 10 mL
  Filled 2018-08-31: qty 10

## 2018-08-31 MED ORDER — PALONOSETRON HCL INJECTION 0.25 MG/5ML
0.2500 mg | Freq: Once | INTRAVENOUS | Status: AC
  Administered 2018-08-31: 12:00:00 0.25 mg via INTRAVENOUS

## 2018-08-31 MED ORDER — POTASSIUM CHLORIDE 2 MEQ/ML IV SOLN
Freq: Once | INTRAVENOUS | Status: AC
  Administered 2018-08-31: 09:00:00 via INTRAVENOUS
  Filled 2018-08-31: qty 10

## 2018-08-31 MED ORDER — SODIUM CHLORIDE 0.9 % IV SOLN
40.0000 mg/m2 | Freq: Once | INTRAVENOUS | Status: AC
  Administered 2018-08-31: 63 mg via INTRAVENOUS
  Filled 2018-08-31: qty 63

## 2018-08-31 MED ORDER — HEPARIN SOD (PORK) LOCK FLUSH 100 UNIT/ML IV SOLN
500.0000 [IU] | Freq: Once | INTRAVENOUS | Status: AC | PRN
  Administered 2018-08-31: 500 [IU]
  Filled 2018-08-31: qty 5

## 2018-08-31 MED ORDER — SODIUM CHLORIDE 0.9 % IV SOLN
Freq: Once | INTRAVENOUS | Status: AC
  Administered 2018-08-31: 12:00:00 via INTRAVENOUS
  Filled 2018-08-31: qty 5

## 2018-08-31 NOTE — Telephone Encounter (Signed)
Please call pt to schedule CPE.  No further refills until pt has CPE.  Charyl Bigger, CMA

## 2018-08-31 NOTE — Patient Instructions (Signed)
[facility] Discharge Instructions for Patients Receiving Chemotherapy  Today you received the following chemotherapy agents Cisplatin (PLATINOL).  To help prevent nausea and vomiting after your treatment, we encourage you to take your nausea medication as prescribed.   If you develop nausea and vomiting that is not controlled by your nausea medication, call the clinic.   BELOW ARE SYMPTOMS THAT SHOULD BE REPORTED IMMEDIATELY:  *FEVER GREATER THAN 100.5 F  *CHILLS WITH OR WITHOUT FEVER  NAUSEA AND VOMITING THAT IS NOT CONTROLLED WITH YOUR NAUSEA MEDICATION  *UNUSUAL SHORTNESS OF BREATH  *UNUSUAL BRUISING OR BLEEDING  TENDERNESS IN MOUTH AND THROAT WITH OR WITHOUT PRESENCE OF ULCERS  *URINARY PROBLEMS  *BOWEL PROBLEMS  UNUSUAL RASH Items with * indicate a potential emergency and should be followed up as soon as possible.  Feel free to call the clinic should you have any questions or concerns. The clinic phone number is [phone].  Please show the [facility] at check-in to the Emergency Department and triage nurse.  Coronavirus (COVID-19) Are you at risk?  Are you at risk for the Coronavirus (COVID-19)?  To be considered HIGH RISK for Coronavirus (COVID-19), you have to meet the following criteria:  . Traveled to [place], [place], [place], [place] or [place]; or in the [place] to [place], [place], [place], or [place]; and have fever, cough, and shortness of breath within the last 2 weeks of travel OR . Been in close contact with a person diagnosed with COVID-19 within the last 2 weeks and have fever, cough, and shortness of breath . IF YOU DO NOT MEET THESE CRITERIA, YOU ARE CONSIDERED LOW RISK FOR COVID-19.  What to do if you are HIGH RISK for COVID-19?  Marland Kitchen If you are having a medical emergency, call 911. . Seek medical care right away. Before you go to a doctor's office, urgent care or emergency department, call ahead and tell them  about your recent travel, contact with someone diagnosed with COVID-19, and your symptoms. You should receive instructions from your physician's office regarding next steps of care.  . When you arrive at healthcare provider, tell the healthcare staff immediately you have returned from visiting [place], [place], [place], [place] or [place]; or traveled in the [place] to [place], [place], [place], or [place]; in the last two weeks or you have been in close contact with a person diagnosed with COVID-19 in the last 2 weeks.   . Tell the health care staff about your symptoms: fever, cough and shortness of breath. . After you have been seen by a medical provider, you will be either: o Tested for (COVID-19) and discharged home on quarantine except to seek medical care if symptoms worsen, and asked to  - Stay home and avoid contact with others until you get your results (4-5 days)  - Avoid travel on public transportation if possible (such as bus, train, or airplane) or o Sent to the Emergency Department by EMS for evaluation, COVID-19 testing, and possible admission depending on your condition and test results.  What to do if you are LOW RISK for COVID-19?  Reduce your risk of any infection by using the same precautions used for avoiding the common cold or flu:  Marland Kitchen Wash your hands often with soap and warm water for at least 20 seconds.  If soap and water are not readily available, use an alcohol-based hand sanitizer with at least 60% alcohol.  . If coughing or  sneezing, cover your mouth and nose by coughing or sneezing into the elbow areas of your shirt or coat, into a tissue or into your sleeve (not your hands). . Avoid shaking hands with others and consider head nods or verbal greetings only. . Avoid touching your eyes, nose, or mouth with unwashed hands.  . Avoid close contact with people who are sick. . Avoid places or events with large numbers of people in one location, like concerts or  sporting events. . Carefully consider travel plans you have or are making. . If you are planning any travel outside or inside the [place], visit the CDC's Travelers' Health webpage for the latest health notices. . If you have some symptoms but not all symptoms, continue to monitor at home and seek medical attention if your symptoms worsen. . If you are having a medical emergency, call 911.   [place] / e-Visit: [email or web address]         [facility]: [facility]: [phone]                   [facility]: [phone] \

## 2018-08-31 NOTE — Progress Notes (Signed)
Per Dr. Alvy Bimler, okay for patient to move forward with treatment today with urine output 128mL.

## 2018-08-31 NOTE — Telephone Encounter (Signed)
No 8/10 los, schedule message, orders, referrals at time of check out.

## 2018-08-31 NOTE — Progress Notes (Signed)
Per Dr. Lurlean Leyden, patient to stay accessed for home infusion. Proper take home dressing applied to port-a-cath.

## 2018-09-01 ENCOUNTER — Other Ambulatory Visit: Payer: Self-pay | Admitting: Radiation Oncology

## 2018-09-01 ENCOUNTER — Telehealth: Payer: Self-pay | Admitting: *Deleted

## 2018-09-01 ENCOUNTER — Encounter: Payer: Self-pay | Admitting: Hematology and Oncology

## 2018-09-01 ENCOUNTER — Ambulatory Visit
Admission: RE | Admit: 2018-09-01 | Discharge: 2018-09-01 | Disposition: A | Discharge status: Home / Self Care | Payer: Managed Care, Other (non HMO) | Source: Ambulatory Visit | Attending: Radiation Oncology | Admitting: Radiation Oncology

## 2018-09-01 ENCOUNTER — Other Ambulatory Visit: Payer: Self-pay

## 2018-09-01 DIAGNOSIS — C538 Malignant neoplasm of overlapping sites of cervix uteri: Secondary | ICD-10-CM | POA: Diagnosis not present

## 2018-09-01 MED ORDER — HYDROCORTISONE ACETATE 25 MG RE SUPP: 25.0000 mg | Freq: Two times a day (BID) | RECTAL | 0 refills | Status: DC

## 2018-09-01 NOTE — Telephone Encounter (Signed)
Telephone call to patient- she feels great today. She is eating and drinking normally. She states the nausea starts on Wednesday after treatment. HH is scheduled to see her tomorrow for IVF at home. Patient appreciates this service being set up for her. She knows to call with any concerns or questions.

## 2018-09-01 NOTE — Assessment & Plan Note (Signed)
She has persistent pancytopenia due to bone marrow suppression from treatment I warned her about potential need for G-CSF support She denies further vaginal bleeding and we will monitor her blood count carefully and transfuse as needed

## 2018-09-01 NOTE — Assessment & Plan Note (Signed)
She continues to experience profound side effects from treatment including nausea, vomiting, dehydration and diarrhea, along with pancytopenia We discussed additional supportive care Ultimately, she agrees to be referred for advanced home care services including IV fluids and IV antiemetics at home Referral is placed and consultation obtained I have also informed the patient there is a great chance she might need G-CSF support this week She will be returning to the cancer center on Thursday for blood draw and if she becomes pancytopenic, she will need G-CSF injection.  She agreed

## 2018-09-01 NOTE — Telephone Encounter (Signed)
-----   Message from Heath Lark, MD sent at 09/01/2018  8:09 AM EDT ----- Regarding: IVF Pam from [facility] said the earliest she can get [facility] set up is tomorrow Can you call her and see if she needs any IVF or IV antiemetics today?

## 2018-09-01 NOTE — Assessment & Plan Note (Signed)
She continues to have profound nausea and vomiting despite antiemetics at home I recommend IV fluids and IV antiemetics at home through advanced home care services and she agreed

## 2018-09-01 NOTE — Assessment & Plan Note (Signed)
She continues to have frequent diarrhea I advised her to continue taking Imodium and Lomotil

## 2018-09-01 NOTE — Progress Notes (Signed)
[facility] OFFICE PROGRESS NOTE  Patient Care Team: Esaw Grandchild, NP as PCP - General (Family Medicine)  ASSESSMENT & PLAN:  Malignant neoplasm of overlapping sites of cervix [facility]) She continues to experience profound side effects from treatment including nausea, vomiting, dehydration and diarrhea, along with pancytopenia We discussed additional supportive care Ultimately, she agrees to be referred for advanced home care services including IV fluids and IV antiemetics at home Referral is placed and consultation obtained I have also informed the patient there is a great chance she might need G-CSF support this week She will be returning to the cancer center on Thursday for blood draw and if she becomes pancytopenic, she will need G-CSF injection.  She agreed  Nausea with vomiting She continues to have profound nausea and vomiting despite antiemetics at home I recommend IV fluids and IV antiemetics at home through advanced home care services and she agreed  Diarrhea She continues to have frequent diarrhea I advised her to continue taking Imodium and Lomotil  Pancytopenia, acquired ([place]) She has persistent pancytopenia due to bone marrow suppression from treatment I warned her about potential need for G-CSF support She denies further vaginal bleeding and we will monitor her blood count carefully and transfuse as needed   Orders Placed This Encounter  Procedures  . Ambulatory referral to Home Health    Referral Priority:   Routine    Referral Type:   Home Health Care    Referral Reason:   Specialty Services Required    Requested Specialty:   [place]    Number of Visits Requested:   1    INTERVAL HISTORY: Please see below for problem oriented charting. She is seen in the infusion room She continues to struggle with nausea vomiting and dehydration approximately day 3 after each cycle of treatment She complains of fatigue She is taking antiemetics  along with Imodium and Lomotil for diarrhea She denies fever or chills She has no further vaginal bleeding  SUMMARY OF ONCOLOGIC HISTORY: Oncology History  Malignant neoplasm of overlapping sites of cervix ([place])  07/26/2018 Imaging   MR pelvis 1. Poorly marginated/infiltrative 4.7 x 3.4 x 3.0 cm mass of the left uterine cervix with invasion of the cervical stroma, worrisome for FIGO stage IB3 cervical carcinoma. No frank parametrial, bladder or vaginal involvement. GYN ONC consultation suggested. 2. No pelvic adenopathy. 3. No suspicious ovarian or adnexal findings.   07/28/2018 Initial Diagnosis   Malignant neoplasm of overlapping sites of cervix [facility])   07/28/2018 Pathology Results   1. Cervix, biopsy, ectocervix - ADENOCARCINOMA. - SEE MICROSCOPIC DESCRIPTION 2. Endocervix, curettage - ADENOCARCINOMA. - SEE MICROSCOPIC DESCRIPTION Microscopic Comment 1. -2. Both of the specimens show adenocarcinoma with similar features and the morphology favors endocervical primary. An endometrial primary cannot be completely excluded.   08/10/2018 Procedure   Status post right IJ port catheter placement. Catheter ready for use.     REVIEW OF SYSTEMS:   Constitutional: Denies fevers, chills or abnormal weight loss Eyes: Denies blurriness of vision Ears, nose, mouth, throat, and face: Denies mucositis or sore throat Respiratory: Denies cough, dyspnea or wheezes Cardiovascular: Denies palpitation, chest discomfort or lower extremity swelling Gastrointestinal:  Denies nausea, heartburn or change in bowel habits Skin: Denies abnormal skin rashes Lymphatics: Denies new lymphadenopathy or easy bruising Neurological:Denies numbness, tingling or new weaknesses Behavioral/Psych: Mood is stable, no new changes  All other systems were reviewed with the patient and are negative.  I have reviewed the  past medical history, past surgical history, social history and family history with the patient and they  are unchanged from previous note.  ALLERGIES:  has No Known Allergies.  MEDICATIONS:  Current Outpatient Medications  Medication Sig Dispense Refill  . Ascorbic Acid (VITAMIN C) 100 MG tablet Take 100 mg by mouth daily.    . diphenoxylate-atropine (LOMOTIL) 2.5-0.025 MG tablet Take 1 tablet by mouth 4 (four) times daily as needed for diarrhea or loose stools. 60 tablet 9  . docusate sodium (COLACE) 100 MG capsule Take 1 capsule (100 mg total) by mouth 2 (two) times daily. 60 capsule 1  . DULoxetine (CYMBALTA) 60 MG capsule Take 1 capsule (60 mg total) by mouth daily. 30 capsule 3  . ferrous sulfate 325 (65 FE) MG tablet Take 1 tablet (325 mg total) by mouth 2 (two) times daily with a meal. 60 tablet 3  . gabapentin (NEURONTIN) 100 MG capsule Take 1 capsule (100 mg total) by mouth 3 (three) times daily. PATIENT NEEDS PHYSICAL PRIOR TO ANY FURTHER REFILLS 90 capsule 0  . lidocaine-prilocaine (EMLA) cream Apply to affected area once 30 g 3  . loperamide (IMODIUM A-D) 2 MG tablet Take 2 mg by mouth 4 (four) times daily as needed.    . Multiple Vitamin (MULTIVITAMIN WITH MINERALS) TABS tablet Take 1 tablet by mouth daily.    . ondansetron (ZOFRAN) 8 MG tablet Take 1 tablet (8 mg total) by mouth every 8 (eight) hours as needed. 30 tablet 1  . prochlorperazine (COMPAZINE) 10 MG tablet Take 1 tablet (10 mg total) by mouth every 6 (six) hours as needed (Nausea or vomiting). 30 tablet 1   No current facility-administered medications for this visit.     PHYSICAL EXAMINATION: ECOG PERFORMANCE STATUS: 2 - Symptomatic, <50% confined to bed GENERAL:alert, no distress and comfortable.  She appears thin and mildly cachectic SKIN: skin color, texture, turgor are normal, no rashes or significant lesions EYES: normal, Conjunctiva are pink and non-injected, sclera clear OROPHARYNX:no exudate, no erythema and lips, buccal mucosa, and tongue normal  NECK: supple, thyroid normal size, non-tender, without  nodularity LYMPH:  no palpable lymphadenopathy in the cervical, axillary or inguinal LUNGS: clear to auscultation and percussion with normal breathing effort HEART: regular rate & rhythm and no murmurs and no lower extremity edema ABDOMEN:abdomen soft, non-tender and normal bowel sounds Musculoskeletal:no cyanosis of digits and no clubbing  NEURO: alert & oriented x 3 with fluent speech, no focal motor/sensory deficits  LABORATORY DATA:  I have reviewed the data as listed    Component Value Date/Time   NA 138 08/27/2018 1535   NA 140 03/20/2018 1237   K 4.1 08/27/2018 1535   CL 103 08/27/2018 1535   CO2 25 08/27/2018 1535   GLUCOSE 108 (H) 08/27/2018 1535   BUN 9 08/27/2018 1535   BUN 7 03/20/2018 1237   CREATININE 0.75 08/27/2018 1535   CREATININE 0.71 08/13/2018 1614   CALCIUM 9.0 08/27/2018 1535   PROT 6.4 (L) 08/27/2018 1535   PROT 7.0 03/20/2018 1237   ALBUMIN 3.7 08/27/2018 1535   ALBUMIN 3.8 04/08/2018 1641   AST 13 (L) 08/27/2018 1535   AST 13 (L) 08/13/2018 1614   ALT 14 08/27/2018 1535   ALT 8 08/13/2018 1614   ALKPHOS 50 08/27/2018 1535   BILITOT 0.3 08/27/2018 1535   BILITOT 0.3 08/13/2018 1614   GFRNONAA >60 08/27/2018 1535   GFRNONAA >60 08/13/2018 1614   GFRAA >60 08/27/2018 1535   GFRAA >60  08/13/2018 1614    No results found for: SPEP, UPEP  Lab Results  Component Value Date   WBC 2.3 (L) 08/27/2018   NEUTROABS 1.7 08/27/2018   HGB 9.2 (L) 08/27/2018   HCT 28.7 (L) 08/27/2018   MCV 95.3 08/27/2018   PLT 195 08/27/2018      Chemistry      Component Value Date/Time   NA 138 08/27/2018 1535   NA 140 03/20/2018 1237   K 4.1 08/27/2018 1535   CL 103 08/27/2018 1535   CO2 25 08/27/2018 1535   BUN 9 08/27/2018 1535   BUN 7 03/20/2018 1237   CREATININE 0.75 08/27/2018 1535   CREATININE 0.71 08/13/2018 1614      Component Value Date/Time   CALCIUM 9.0 08/27/2018 1535   ALKPHOS 50 08/27/2018 1535   AST 13 (L) 08/27/2018 1535   AST 13 (L)  08/13/2018 1614   ALT 14 08/27/2018 1535   ALT 8 08/13/2018 1614   BILITOT 0.3 08/27/2018 1535   BILITOT 0.3 08/13/2018 1614       RADIOGRAPHIC STUDIES: I have personally reviewed the radiological images as listed and agreed with the findings in the report. Nm Pet Image Initial (pi) Skull Base To Thigh  Result Date: 08/03/2018 CLINICAL DATA:  Initial treatment strategy for cervical cancer. EXAM: NUCLEAR MEDICINE PET SKULL BASE TO THIGH TECHNIQUE: 6.2 mCi F-18 FDG was injected intravenously. Full-ring PET imaging was performed from the skull base to thigh after the radiotracer. CT data was obtained and used for attenuation correction and anatomic localization. Fasting blood glucose: 84 mg/dl COMPARISON:  None. FINDINGS: Mediastinal blood pool activity: SUV max 1.8 Liver activity: SUV max NA NECK: No hypermetabolic lymph nodes in the neck. Symmetric uptake in the oropharynx bilaterally is nonspecific. Incidental CT findings: none CHEST: No hypermetabolic mediastinal or hilar nodes.2 mm right lower lobe nodule visible on 33/3. No overtly suspicious nodule or mass. Incidental CT findings: None. ABDOMEN/PELVIS: Cervical mass is markedly hypermetabolic with SUV max = 08.1. No hypermetabolic pelvic sidewall lymphadenopathy. No evidence for hypermetabolic metastatic disease in the abdomen/pelvis. Incidental CT findings: Tiny calcified gallstones evident. There is abdominal aortic atherosclerosis without aneurysm. Trace intraperitoneal free fluid evident. SKELETON: No focal hypermetabolic activity to suggest skeletal metastasis. Incidental CT findings: none IMPRESSION: 1. Hypermetabolic cervical mass compatible with known neoplasm. No evidence for hypermetabolic metastatic disease in the neck, chest, abdomen, or pelvis. Electronically Signed   By: Misty Stanley M.D.   On: 08/03/2018 13:18   Ir Imaging Guided Port Insertion  Result Date: 08/11/2018 INDICATION: 43 year old female with a history of cervical  carcinoma referred for port catheter placement EXAM: IMPLANTED PORT A CATH PLACEMENT WITH ULTRASOUND AND FLUOROSCOPIC GUIDANCE MEDICATIONS: 2 g Ancef; The antibiotic was administered within an appropriate time interval prior to skin puncture. ANESTHESIA/SEDATION: Moderate (conscious) sedation was employed during this procedure. A total of Versed 3.0 mg and Fentanyl 100 mcg was administered intravenously. Moderate Sedation Time: 21 minutes. The patient's level of consciousness and vital signs were monitored continuously by radiology nursing throughout the procedure under my direct supervision. FLUOROSCOPY TIME:  0 minutes, 6 seconds (0 mGy) COMPLICATIONS: None PROCEDURE: The procedure, risks, benefits, and alternatives were explained to the patient. Questions regarding the procedure were encouraged and answered. The patient understands and consents to the procedure. Ultrasound survey was performed with images stored and sent to PACs. The right neck and chest was prepped with chlorhexidine, and draped in the usual sterile fashion using maximum barrier technique (cap and  mask, sterile gown, sterile gloves, large sterile sheet, hand hygiene and cutaneous antiseptic). Antibiotic prophylaxis was provided with 2.0g Ancef administered IV one hour prior to skin incision. Local anesthesia was attained by infiltration with 1% lidocaine without epinephrine. Ultrasound demonstrated patency of the right internal jugular vein, and this was documented with an image. Under real-time ultrasound guidance, this vein was accessed with a 21 gauge micropuncture needle and image documentation was performed. A small dermatotomy was made at the access site with an 11 scalpel. A 0.018" wire was advanced into the SVC and used to estimate the length of the internal catheter. The access needle exchanged for a 1F micropuncture vascular sheath. The 0.018" wire was then removed and a 0.035" wire advanced into the IVC. An appropriate location for  the subcutaneous reservoir was selected below the clavicle and an incision was made through the skin and underlying soft tissues. The subcutaneous tissues were then dissected using a combination of blunt and sharp surgical technique and a pocket was formed. A single lumen power injectable portacatheter was then tunneled through the subcutaneous tissues from the pocket to the dermatotomy and the port reservoir placed within the subcutaneous pocket. The venous access site was then serially dilated and a peel away vascular sheath placed over the wire. The wire was removed and the port catheter advanced into position under fluoroscopic guidance. The catheter tip is positioned in the cavoatrial junction. This was documented with a spot image. The portacatheter was then tested and found to flush and aspirate well. The port was flushed with saline followed by 100 units/mL heparinized saline. The pocket was then closed in two layers using first subdermal inverted interrupted absorbable sutures followed by a running subcuticular suture. The epidermis was then sealed with Dermabond. The dermatotomy at the venous access site was also seal with Dermabond. Patient tolerated the procedure well and remained hemodynamically stable throughout. No complications encountered and no significant blood loss encountered IMPRESSION: Status post right IJ port catheter placement. Catheter ready for use. Signed, Dulcy Fanny. Dellia Nims, RPVI [facility] Radiology Electronically Signed   By: Corrie Mckusick D.O.   On: 08/11/2018 13:54    All questions were answered. The patient knows to call the clinic with any problems, questions or concerns. No barriers to learning was detected.  I spent 25 minutes counseling the patient face to face. The total time spent in the appointment was 40 minutes and more than 50% was on counseling and review of test results  Heath Lark, MD 09/01/2018 8:13 AM

## 2018-09-02 ENCOUNTER — Other Ambulatory Visit (HOSPITAL_COMMUNITY): Payer: Self-pay | Admitting: Radiation Oncology

## 2018-09-02 ENCOUNTER — Ambulatory Visit: Payer: Managed Care, Other (non HMO)

## 2018-09-02 ENCOUNTER — Ambulatory Visit
Admission: RE | Admit: 2018-09-02 | Discharge: 2018-09-02 | Disposition: A | Discharge status: Home / Self Care | Payer: Managed Care, Other (non HMO) | Source: Ambulatory Visit | Attending: Radiation Oncology | Admitting: Radiation Oncology

## 2018-09-02 ENCOUNTER — Other Ambulatory Visit: Payer: Self-pay | Admitting: Radiation Oncology

## 2018-09-02 DIAGNOSIS — C539 Malignant neoplasm of cervix uteri, unspecified: Secondary | ICD-10-CM

## 2018-09-02 DIAGNOSIS — C538 Malignant neoplasm of overlapping sites of cervix uteri: Secondary | ICD-10-CM | POA: Diagnosis not present

## 2018-09-03 ENCOUNTER — Inpatient Hospital Stay: Payer: Managed Care, Other (non HMO)

## 2018-09-03 ENCOUNTER — Telehealth: Payer: Self-pay

## 2018-09-03 ENCOUNTER — Ambulatory Visit: Payer: Managed Care, Other (non HMO)

## 2018-09-03 ENCOUNTER — Other Ambulatory Visit: Payer: Self-pay

## 2018-09-03 ENCOUNTER — Ambulatory Visit
Admission: RE | Admit: 2018-09-03 | Discharge: 2018-09-03 | Disposition: A | Discharge status: Home / Self Care | Payer: Managed Care, Other (non HMO) | Source: Ambulatory Visit | Attending: Radiation Oncology | Admitting: Radiation Oncology

## 2018-09-03 DIAGNOSIS — C538 Malignant neoplasm of overlapping sites of cervix uteri: Secondary | ICD-10-CM | POA: Diagnosis not present

## 2018-09-03 DIAGNOSIS — D649 Anemia, unspecified: Secondary | ICD-10-CM

## 2018-09-03 LAB — CBC WITH DIFFERENTIAL/PLATELET
Abs Immature Granulocytes: 0 10*3/uL (ref 0.00–0.07)
Basophils Absolute: 0 10*3/uL (ref 0.0–0.1)
Basophils Relative: 1 %
Eosinophils Absolute: 0 10*3/uL (ref 0.0–0.5)
Eosinophils Relative: 0 %
HCT: 27.2 % — ABNORMAL LOW (ref 36.0–46.0)
Hemoglobin: 8.8 g/dL — ABNORMAL LOW (ref 12.0–15.0)
Immature Granulocytes: 0 %
Lymphocytes Relative: 14 %
Lymphs Abs: 0.2 10*3/uL — ABNORMAL LOW (ref 0.7–4.0)
MCH: 30.7 pg (ref 26.0–34.0)
MCHC: 32.4 g/dL (ref 30.0–36.0)
MCV: 94.8 fL (ref 80.0–100.0)
Monocytes Absolute: 0.3 10*3/uL (ref 0.1–1.0)
Monocytes Relative: 18 %
Neutro Abs: 0.9 10*3/uL — ABNORMAL LOW (ref 1.7–7.7)
Neutrophils Relative %: 67 %
Platelets: 164 10*3/uL (ref 150–400)
RBC: 2.87 MIL/uL — ABNORMAL LOW (ref 3.87–5.11)
RDW: 14.5 % (ref 11.5–15.5)
WBC: 1.4 10*3/uL — ABNORMAL LOW (ref 4.0–10.5)
nRBC: 0 % (ref 0.0–0.2)

## 2018-09-03 LAB — COMPREHENSIVE METABOLIC PANEL
ALT: 12 U/L (ref 0–44)
AST: 14 U/L — ABNORMAL LOW (ref 15–41)
Albumin: 3.8 g/dL (ref 3.5–5.0)
Alkaline Phosphatase: 47 U/L (ref 38–126)
Anion gap: 8 (ref 5–15)
BUN: 7 mg/dL (ref 6–20)
CO2: 25 mmol/L (ref 22–32)
Calcium: 9 mg/dL (ref 8.9–10.3)
Chloride: 105 mmol/L (ref 98–111)
Creatinine, Ser: 0.74 mg/dL (ref 0.44–1.00)
GFR calc Af Amer: >60 mL/min (ref >60)
GFR calc non Af Amer: >60 mL/min (ref >60)
Glucose, Bld: 92 mg/dL (ref 70–99)
Potassium: 4.3 mmol/L (ref 3.5–5.1)
Sodium: 138 mmol/L (ref 135–145)
Total Bilirubin: 0.3 mg/dL (ref 0.3–1.2)
Total Protein: 6.4 g/dL — ABNORMAL LOW (ref 6.5–8.1)

## 2018-09-03 LAB — MAGNESIUM: Magnesium: 1.9 mg/dL (ref 1.7–2.4)

## 2018-09-03 LAB — SAMPLE TO BLOOD BANK

## 2018-09-03 NOTE — Telephone Encounter (Signed)
Pt called stating that she thinks her port needle may be leaking. Pt has port needle in place to administer IV fluids at home. Pt states she has also called her [facility] care team. This RN advised pt that if the [facility] nurse cannot come out to assess port and re-access she needs  come to Dr Calton Dach office prior to her radiation appt today. Pt verbalizes understanding.

## 2018-09-03 NOTE — Telephone Encounter (Signed)
Pt came to office prior to her radiation appt to have her port and needle assess. Dsg found to be moist with serous fluid. Needle removed.  Small amount of serous fluid leaked from needle insertion site. Mild edema no erythema. Pt verbalizes mildly tender to touch. Pt did not want to have port re-access. This RN instructed pt to call the office tomorrow before 12noon to let us know if she would like to have the port re-access to get hydration over the weekend. - pt can then come in prior to her radiation appt tomorrow. Pt verbalizes understanding.

## 2018-09-04 ENCOUNTER — Inpatient Hospital Stay: Payer: Managed Care, Other (non HMO)

## 2018-09-04 ENCOUNTER — Telehealth: Payer: Self-pay | Admitting: *Deleted

## 2018-09-04 ENCOUNTER — Ambulatory Visit: Payer: Managed Care, Other (non HMO)

## 2018-09-04 ENCOUNTER — Ambulatory Visit
Admission: RE | Admit: 2018-09-04 | Discharge: 2018-09-04 | Disposition: A | Discharge status: Home / Self Care | Payer: Managed Care, Other (non HMO) | Source: Ambulatory Visit | Attending: Radiation Oncology | Admitting: Radiation Oncology

## 2018-09-04 ENCOUNTER — Other Ambulatory Visit: Payer: Self-pay

## 2018-09-04 DIAGNOSIS — C538 Malignant neoplasm of overlapping sites of cervix uteri: Secondary | ICD-10-CM

## 2018-09-04 MED ORDER — FILGRASTIM-SNDZ 300 MCG/0.5ML IJ SOSY
300.0000 ug | PREFILLED_SYRINGE | Freq: Once | INTRAMUSCULAR | Status: AC
  Administered 2018-09-04: 300 ug via SUBCUTANEOUS
  Filled 2018-09-04: qty 0.5

## 2018-09-04 NOTE — Telephone Encounter (Signed)
Patient only able to get 1 Zarxio injection- please collect CBC with diff only for review prior to chemo on 8/17.   Ok to start pre-hydration/premeds prior to getting results. Dr. Alvy Bimler to see patient in infusion.

## 2018-09-04 NOTE — Patient Instructions (Signed)

## 2018-09-07 ENCOUNTER — Inpatient Hospital Stay: Payer: Managed Care, Other (non HMO)

## 2018-09-07 ENCOUNTER — Ambulatory Visit: Payer: Managed Care, Other (non HMO)

## 2018-09-07 ENCOUNTER — Inpatient Hospital Stay (HOSPITAL_BASED_OUTPATIENT_CLINIC_OR_DEPARTMENT_OTHER): Payer: Managed Care, Other (non HMO) | Admitting: Hematology and Oncology

## 2018-09-07 ENCOUNTER — Other Ambulatory Visit: Payer: Self-pay

## 2018-09-07 ENCOUNTER — Ambulatory Visit
Admission: RE | Admit: 2018-09-07 | Discharge: 2018-09-07 | Disposition: A | Discharge status: Home / Self Care | Payer: Managed Care, Other (non HMO) | Source: Ambulatory Visit | Attending: Radiation Oncology | Admitting: Radiation Oncology

## 2018-09-07 ENCOUNTER — Inpatient Hospital Stay: Payer: Managed Care, Other (non HMO) | Admitting: Nutrition

## 2018-09-07 VITALS — BP 97/54 | HR 74 | Temp 98.3°F | Resp 16

## 2018-09-07 DIAGNOSIS — D61818 Other pancytopenia: Secondary | ICD-10-CM | POA: Diagnosis not present

## 2018-09-07 DIAGNOSIS — R197 Diarrhea, unspecified: Secondary | ICD-10-CM

## 2018-09-07 DIAGNOSIS — C538 Malignant neoplasm of overlapping sites of cervix uteri: Secondary | ICD-10-CM

## 2018-09-07 DIAGNOSIS — R112 Nausea with vomiting, unspecified: Secondary | ICD-10-CM | POA: Diagnosis not present

## 2018-09-07 DIAGNOSIS — D649 Anemia, unspecified: Secondary | ICD-10-CM

## 2018-09-07 LAB — CBC WITH DIFFERENTIAL (CANCER CENTER ONLY)
Abs Immature Granulocytes: 0.02 10*3/uL (ref 0.00–0.07)
Basophils Absolute: 0 10*3/uL (ref 0.0–0.1)
Basophils Relative: 0 %
Eosinophils Absolute: 0 10*3/uL (ref 0.0–0.5)
Eosinophils Relative: 0 %
HCT: 26.5 % — ABNORMAL LOW (ref 36.0–46.0)
Hemoglobin: 8.8 g/dL — ABNORMAL LOW (ref 12.0–15.0)
Immature Granulocytes: 1 %
Lymphocytes Relative: 11 %
Lymphs Abs: 0.3 10*3/uL — ABNORMAL LOW (ref 0.7–4.0)
MCH: 30.8 pg (ref 26.0–34.0)
MCHC: 33.2 g/dL (ref 30.0–36.0)
MCV: 92.7 fL (ref 80.0–100.0)
Monocytes Absolute: 0.3 10*3/uL (ref 0.1–1.0)
Monocytes Relative: 11 %
Neutro Abs: 2.3 10*3/uL (ref 1.7–7.7)
Neutrophils Relative %: 77 %
Platelet Count: 120 10*3/uL — ABNORMAL LOW (ref 150–400)
RBC: 2.86 MIL/uL — ABNORMAL LOW (ref 3.87–5.11)
RDW: 14.8 % (ref 11.5–15.5)
WBC Count: 3 10*3/uL — ABNORMAL LOW (ref 4.0–10.5)
nRBC: 0 % (ref 0.0–0.2)

## 2018-09-07 MED ORDER — DEXAMETHASONE 4 MG PO TABS: 4.0000 mg | ORAL_TABLET | Freq: Every day | ORAL | 0 refills | Status: DC

## 2018-09-07 MED ORDER — POTASSIUM CHLORIDE 2 MEQ/ML IV SOLN
Freq: Once | INTRAVENOUS | Status: AC
  Administered 2018-09-07: 10:00:00 via INTRAVENOUS
  Filled 2018-09-07: qty 10

## 2018-09-07 MED ORDER — SODIUM CHLORIDE 0.9 % IV SOLN
Freq: Once | INTRAVENOUS | Status: AC
  Administered 2018-09-07: 09:00:00 via INTRAVENOUS
  Filled 2018-09-07: qty 250

## 2018-09-07 MED ORDER — SCOPOLAMINE 1 MG/3DAYS TD PT72: 1.0000 | MEDICATED_PATCH | TRANSDERMAL | 12 refills | Status: DC

## 2018-09-07 MED ORDER — PALONOSETRON HCL INJECTION 0.25 MG/5ML
0.2500 mg | Freq: Once | INTRAVENOUS | Status: AC
  Administered 2018-09-07: 10:00:00 0.25 mg via INTRAVENOUS

## 2018-09-07 MED ORDER — PALONOSETRON HCL INJECTION 0.25 MG/5ML
INTRAVENOUS | Status: AC
  Filled 2018-09-07: qty 5

## 2018-09-07 MED ORDER — DIPHENOXYLATE-ATROPINE 2.5-0.025 MG PO TABS
1.0000 | ORAL_TABLET | Freq: Once | ORAL | Status: AC
  Administered 2018-09-07: 1 via ORAL

## 2018-09-07 MED ORDER — SODIUM CHLORIDE 0.9% FLUSH
10.0000 mL | INTRAVENOUS | Status: DC | PRN
  Administered 2018-09-07: 10 mL
  Filled 2018-09-07: qty 10

## 2018-09-07 MED ORDER — HEPARIN SOD (PORK) LOCK FLUSH 100 UNIT/ML IV SOLN
500.0000 [IU] | Freq: Once | INTRAVENOUS | Status: AC | PRN
  Administered 2018-09-07: 500 [IU]
  Filled 2018-09-07: qty 5

## 2018-09-07 MED ORDER — DIPHENOXYLATE-ATROPINE 2.5-0.025 MG PO TABS
ORAL_TABLET | ORAL | Status: AC
  Filled 2018-09-07: qty 1

## 2018-09-07 MED ORDER — SODIUM CHLORIDE 0.9 % IV SOLN
40.0000 mg/m2 | Freq: Once | INTRAVENOUS | Status: AC
  Administered 2018-09-07: 13:00:00 63 mg via INTRAVENOUS
  Filled 2018-09-07: qty 63

## 2018-09-07 MED ORDER — SODIUM CHLORIDE 0.9 % IV SOLN
Freq: Once | INTRAVENOUS | Status: AC
  Administered 2018-09-07: 12:00:00 via INTRAVENOUS
  Filled 2018-09-07: qty 5

## 2018-09-07 MED FILL — DEXAMETHASONE 4 MG TABLET: 4 | 20 days supply | Qty: 20 | Fill #0

## 2018-09-07 MED FILL — SCOPOLAMINE 1 MG/3DAYS PT72: 1 | 30 days supply | Qty: 10 | Fill #0

## 2018-09-07 NOTE — Progress Notes (Signed)
Nutrition follow-up completed with patient during infusion for cervical cancer. Last weight documented was 115 pounds on August 10. Patient reports she continues to have some nausea and vomiting.  She is able to keep down [employer] oral nutrition supplement. She also tolerates pured foods. She is reporting increased diarrhea and says most food runs right through her.  I could not establish if she is taking her antidiarrheal medication.  Nutrition diagnosis: Unintended weight loss continues.  Intervention: Recommended patient continue low fiber diet secondary to diarrhea and try to increase oral intake. She is to continue strategies for eating to improve nausea and vomiting. Continue aromatherapy. Continue oral nutrition supplements.  Monitoring, evaluation, goals: Patient will work to increase oral intake to minimize further weight loss.  Next visit: To be scheduled as needed.  Patient has my contact information.  **Disclaimer: This note was dictated with voice recognition software. Similar sounding words can inadvertently be transcribed and this note may contain transcription errors which may not have been corrected upon publication of note.**

## 2018-09-07 NOTE — Progress Notes (Signed)
Per Dr Alvy Bimler ok to release pts hydration fluids and premeds this am 09/07/2018. Pt to receive premeds regardless of labs and regardless of  pt receiving tx today.   Per Dr Alvy Bimler ok to tx with Cisplatin regardless of urine output today 09/07/2018.

## 2018-09-07 NOTE — Patient Instructions (Signed)
[facility] Discharge Instructions for Patients Receiving Chemotherapy  Today you received the following chemotherapy agents Cisplatin (PLATINOL).  To help prevent nausea and vomiting after your treatment, we encourage you to take your nausea medication as prescribed.   If you develop nausea and vomiting that is not controlled by your nausea medication, call the clinic.   BELOW ARE SYMPTOMS THAT SHOULD BE REPORTED IMMEDIATELY:  *FEVER GREATER THAN 100.5 F  *CHILLS WITH OR WITHOUT FEVER  NAUSEA AND VOMITING THAT IS NOT CONTROLLED WITH YOUR NAUSEA MEDICATION  *UNUSUAL SHORTNESS OF BREATH  *UNUSUAL BRUISING OR BLEEDING  TENDERNESS IN MOUTH AND THROAT WITH OR WITHOUT PRESENCE OF ULCERS  *URINARY PROBLEMS  *BOWEL PROBLEMS  UNUSUAL RASH Items with * indicate a potential emergency and should be followed up as soon as possible.  Feel free to call the clinic should you have any questions or concerns. The clinic phone number is [phone].  Please show the [facility] at check-in to the Emergency Department and triage nurse.  Coronavirus (COVID-19) Are you at risk?  Are you at risk for the Coronavirus (COVID-19)?  To be considered HIGH RISK for Coronavirus (COVID-19), you have to meet the following criteria:  . Traveled to [place], [place], [place], [place] or [place]; or in the [place] to [place], [place], [place], or [place]; and have fever, cough, and shortness of breath within the last 2 weeks of travel OR . Been in close contact with a person diagnosed with COVID-19 within the last 2 weeks and have fever, cough, and shortness of breath . IF YOU DO NOT MEET THESE CRITERIA, YOU ARE CONSIDERED LOW RISK FOR COVID-19.  What to do if you are HIGH RISK for COVID-19?  Marland Kitchen If you are having a medical emergency, call 911. . Seek medical care right away. Before you go to a doctor's office, urgent care or emergency department, call ahead and tell them  about your recent travel, contact with someone diagnosed with COVID-19, and your symptoms. You should receive instructions from your physician's office regarding next steps of care.  . When you arrive at healthcare provider, tell the healthcare staff immediately you have returned from visiting [place], [place], [place], [place] or [place]; or traveled in the [place] to [place], [place], [place], or [place]; in the last two weeks or you have been in close contact with a person diagnosed with COVID-19 in the last 2 weeks.   . Tell the health care staff about your symptoms: fever, cough and shortness of breath. . After you have been seen by a medical provider, you will be either: o Tested for (COVID-19) and discharged home on quarantine except to seek medical care if symptoms worsen, and asked to  - Stay home and avoid contact with others until you get your results (4-5 days)  - Avoid travel on public transportation if possible (such as bus, train, or airplane) or o Sent to the Emergency Department by EMS for evaluation, COVID-19 testing, and possible admission depending on your condition and test results.  What to do if you are LOW RISK for COVID-19?  Reduce your risk of any infection by using the same precautions used for avoiding the common cold or flu:  Marland Kitchen Wash your hands often with soap and warm water for at least 20 seconds.  If soap and water are not readily available, use an alcohol-based hand sanitizer with at least 60% alcohol.  . If coughing or  sneezing, cover your mouth and nose by coughing or sneezing into the elbow areas of your shirt or coat, into a tissue or into your sleeve (not your hands). . Avoid shaking hands with others and consider head nods or verbal greetings only. . Avoid touching your eyes, nose, or mouth with unwashed hands.  . Avoid close contact with people who are sick. . Avoid places or events with large numbers of people in one location, like concerts or  sporting events. . Carefully consider travel plans you have or are making. . If you are planning any travel outside or inside the [place], visit the CDC's Travelers' Health webpage for the latest health notices. . If you have some symptoms but not all symptoms, continue to monitor at home and seek medical attention if your symptoms worsen. . If you are having a medical emergency, call 911.   [place] / e-Visit: [email or web address]         [facility]: [facility]: [phone]                   [facility]: [phone] \

## 2018-09-08 ENCOUNTER — Ambulatory Visit
Admission: RE | Admit: 2018-09-08 | Discharge: 2018-09-08 | Disposition: A | Discharge status: Home / Self Care | Payer: Managed Care, Other (non HMO) | Source: Ambulatory Visit | Attending: Radiation Oncology | Admitting: Radiation Oncology

## 2018-09-08 ENCOUNTER — Encounter: Payer: Self-pay | Admitting: Hematology and Oncology

## 2018-09-08 ENCOUNTER — Other Ambulatory Visit: Payer: Self-pay

## 2018-09-08 ENCOUNTER — Encounter: Payer: Managed Care, Other (non HMO) | Admitting: Family Medicine

## 2018-09-08 DIAGNOSIS — C538 Malignant neoplasm of overlapping sites of cervix uteri: Secondary | ICD-10-CM | POA: Diagnosis not present

## 2018-09-08 NOTE — Assessment & Plan Note (Signed)
She continues to have very poor tolerance to treatment despite dose adjustment and additional supportive care We will proceed with cycle 4 treatment as scheduled Based on my assessment, it is unlikely she can tolerate 5 doses of treatment I will see her again next week and reassess

## 2018-09-08 NOTE — Assessment & Plan Note (Signed)
She continues to have frequent diarrhea I advised her to continue taking Imodium and Lomotil

## 2018-09-08 NOTE — Progress Notes (Signed)
[facility] OFFICE PROGRESS NOTE  Patient Care Team: Esaw Grandchild, NP as PCP - General (Family Medicine)  ASSESSMENT & PLAN:  Malignant neoplasm of overlapping sites of cervix [facility]) She continues to have very poor tolerance to treatment despite dose adjustment and additional supportive care We will proceed with cycle 4 treatment as scheduled Based on my assessment, it is unlikely she can tolerate 5 doses of treatment I will see her again next week and reassess  Pancytopenia, acquired ([place]) Her white count has improved with G-CSF support She does not need transfusion for now We will continue to monitor her blood count carefully  Nausea with vomiting The patient does not want to resume advanced home care IV fluids at home due to concern about recent leak around her port I recommend daily dexamethasone and scopolamine patch as needed  Diarrhea She continues to have frequent diarrhea I advised her to continue taking Imodium and Lomotil   No orders of the defined types were placed in this encounter.   INTERVAL HISTORY: Please see below for problem oriented charting. She is seen in the infusion room prior to cycle 4 of treatment She continues to have troubles with severe nausea and vomiting along with diarrhea She was not able to get IV fluids over the weekend because the port was leaking and she is afraid to manage IV fluids at home No recent fever or chills She denies vaginal bleeding  SUMMARY OF ONCOLOGIC HISTORY: Oncology History  Malignant neoplasm of overlapping sites of cervix ([place])  07/26/2018 Imaging   MR pelvis 1. Poorly marginated/infiltrative 4.7 x 3.4 x 3.0 cm mass of the left uterine cervix with invasion of the cervical stroma, worrisome for FIGO stage IB3 cervical carcinoma. No frank parametrial, bladder or vaginal involvement. GYN ONC consultation suggested. 2. No pelvic adenopathy. 3. No suspicious ovarian or adnexal findings.   07/28/2018  Initial Diagnosis   Malignant neoplasm of overlapping sites of cervix [facility])   07/28/2018 Pathology Results   1. Cervix, biopsy, ectocervix - ADENOCARCINOMA. - SEE MICROSCOPIC DESCRIPTION 2. Endocervix, curettage - ADENOCARCINOMA. - SEE MICROSCOPIC DESCRIPTION Microscopic Comment 1. -2. Both of the specimens show adenocarcinoma with similar features and the morphology favors endocervical primary. An endometrial primary cannot be completely excluded.   08/10/2018 Procedure   Status post right IJ port catheter placement. Catheter ready for use.     REVIEW OF SYSTEMS:   Constitutional: Denies fevers, chills or abnormal weight loss Eyes: Denies blurriness of vision Ears, nose, mouth, throat, and face: Denies mucositis or sore throat Respiratory: Denies cough, dyspnea or wheezes Cardiovascular: Denies palpitation, chest discomfort or lower extremity swelling Skin: Denies abnormal skin rashes Lymphatics: Denies new lymphadenopathy or easy bruising Neurological:Denies numbness, tingling or new weaknesses Behavioral/Psych: Mood is stable, no new changes  All other systems were reviewed with the patient and are negative.  I have reviewed the past medical history, past surgical history, social history and family history with the patient and they are unchanged from previous note.  ALLERGIES:  has No Known Allergies.  MEDICATIONS:  Current Outpatient Medications  Medication Sig Dispense Refill  . Ascorbic Acid (VITAMIN C) 100 MG tablet Take 100 mg by mouth daily.    Marland Kitchen dexamethasone (DECADRON) 4 MG tablet Take 1 tablet (4 mg total) by mouth daily. 20 tablet 0  . diphenoxylate-atropine (LOMOTIL) 2.5-0.025 MG tablet Take 1 tablet by mouth 4 (four) times daily as needed for diarrhea or loose stools. 60 tablet 9  . docusate  sodium (COLACE) 100 MG capsule Take 1 capsule (100 mg total) by mouth 2 (two) times daily. 60 capsule 1  . DULoxetine (CYMBALTA) 60 MG capsule Take 1 capsule (60 mg total) by  mouth daily. 30 capsule 3  . ferrous sulfate 325 (65 FE) MG tablet Take 1 tablet (325 mg total) by mouth 2 (two) times daily with a meal. 60 tablet 3  . gabapentin (NEURONTIN) 100 MG capsule Take 1 capsule (100 mg total) by mouth 3 (three) times daily. PATIENT NEEDS PHYSICAL PRIOR TO ANY FURTHER REFILLS 90 capsule 0  . hydrocortisone (ANUSOL-HC) 25 MG suppository Place 1 suppository (25 mg total) rectally 2 (two) times daily. 12 suppository 0  . lidocaine-prilocaine (EMLA) cream Apply to affected area once 30 g 3  . loperamide (IMODIUM A-D) 2 MG tablet Take 2 mg by mouth 4 (four) times daily as needed.    . Multiple Vitamin (MULTIVITAMIN WITH MINERALS) TABS tablet Take 1 tablet by mouth daily.    . ondansetron (ZOFRAN) 8 MG tablet Take 1 tablet (8 mg total) by mouth every 8 (eight) hours as needed. 30 tablet 1  . prochlorperazine (COMPAZINE) 10 MG tablet Take 1 tablet (10 mg total) by mouth every 6 (six) hours as needed (Nausea or vomiting). 30 tablet 1  . scopolamine (TRANSDERM-SCOP) 1 MG/3DAYS Place 1 patch (1.5 mg total) onto the skin every 3 (three) days. 10 patch 12   No current facility-administered medications for this visit.     PHYSICAL EXAMINATION: ECOG PERFORMANCE STATUS: 2 - Symptomatic, <50% confined to bed GENERAL:alert, no distress and comfortable SKIN: skin color, texture, turgor are normal, no rashes or significant lesions EYES: normal, Conjunctiva are pink and non-injected, sclera clear OROPHARYNX:no exudate, no erythema and lips, buccal mucosa, and tongue normal  NECK: supple, thyroid normal size, non-tender, without nodularity LYMPH:  no palpable lymphadenopathy in the cervical, axillary or inguinal LUNGS: clear to auscultation and percussion with normal breathing effort HEART: regular rate & rhythm and no murmurs and no lower extremity edema ABDOMEN:abdomen soft, non-tender and normal bowel sounds Musculoskeletal:no cyanosis of digits and no clubbing  NEURO: alert &  oriented x 3 with fluent speech, no focal motor/sensory deficits  LABORATORY DATA:  I have reviewed the data as listed    Component Value Date/Time   NA 138 09/03/2018 1601   NA 140 03/20/2018 1237   K 4.3 09/03/2018 1601   CL 105 09/03/2018 1601   CO2 25 09/03/2018 1601   GLUCOSE 92 09/03/2018 1601   BUN 7 09/03/2018 1601   BUN 7 03/20/2018 1237   CREATININE 0.74 09/03/2018 1601   CREATININE 0.71 08/13/2018 1614   CALCIUM 9.0 09/03/2018 1601   PROT 6.4 (L) 09/03/2018 1601   PROT 7.0 03/20/2018 1237   ALBUMIN 3.8 09/03/2018 1601   ALBUMIN 3.8 04/08/2018 1641   AST 14 (L) 09/03/2018 1601   AST 13 (L) 08/13/2018 1614   ALT 12 09/03/2018 1601   ALT 8 08/13/2018 1614   ALKPHOS 47 09/03/2018 1601   BILITOT 0.3 09/03/2018 1601   BILITOT 0.3 08/13/2018 1614   GFRNONAA >60 09/03/2018 1601   GFRNONAA >60 08/13/2018 1614   GFRAA >60 09/03/2018 1601   GFRAA >60 08/13/2018 1614    No results found for: SPEP, UPEP  Lab Results  Component Value Date   WBC 3.0 (L) 09/07/2018   NEUTROABS 2.3 09/07/2018   HGB 8.8 (L) 09/07/2018   HCT 26.5 (L) 09/07/2018   MCV 92.7 09/07/2018   PLT 120 (  L) 09/07/2018      Chemistry      Component Value Date/Time   NA 138 09/03/2018 1601   NA 140 03/20/2018 1237   K 4.3 09/03/2018 1601   CL 105 09/03/2018 1601   CO2 25 09/03/2018 1601   BUN 7 09/03/2018 1601   BUN 7 03/20/2018 1237   CREATININE 0.74 09/03/2018 1601   CREATININE 0.71 08/13/2018 1614      Component Value Date/Time   CALCIUM 9.0 09/03/2018 1601   ALKPHOS 47 09/03/2018 1601   AST 14 (L) 09/03/2018 1601   AST 13 (L) 08/13/2018 1614   ALT 12 09/03/2018 1601   ALT 8 08/13/2018 1614   BILITOT 0.3 09/03/2018 1601   BILITOT 0.3 08/13/2018 1614       RADIOGRAPHIC STUDIES: I have personally reviewed the radiological images as listed and agreed with the findings in the report. Ir Imaging Guided Port Insertion  Result Date: 08/11/2018 INDICATION: 43 year old female with a  history of cervical carcinoma referred for port catheter placement EXAM: IMPLANTED PORT A CATH PLACEMENT WITH ULTRASOUND AND FLUOROSCOPIC GUIDANCE MEDICATIONS: 2 g Ancef; The antibiotic was administered within an appropriate time interval prior to skin puncture. ANESTHESIA/SEDATION: Moderate (conscious) sedation was employed during this procedure. A total of Versed 3.0 mg and Fentanyl 100 mcg was administered intravenously. Moderate Sedation Time: 21 minutes. The patient's level of consciousness and vital signs were monitored continuously by radiology nursing throughout the procedure under my direct supervision. FLUOROSCOPY TIME:  0 minutes, 6 seconds (0 mGy) COMPLICATIONS: None PROCEDURE: The procedure, risks, benefits, and alternatives were explained to the patient. Questions regarding the procedure were encouraged and answered. The patient understands and consents to the procedure. Ultrasound survey was performed with images stored and sent to PACs. The right neck and chest was prepped with chlorhexidine, and draped in the usual sterile fashion using maximum barrier technique (cap and mask, sterile gown, sterile gloves, large sterile sheet, hand hygiene and cutaneous antiseptic). Antibiotic prophylaxis was provided with 2.0g Ancef administered IV one hour prior to skin incision. Local anesthesia was attained by infiltration with 1% lidocaine without epinephrine. Ultrasound demonstrated patency of the right internal jugular vein, and this was documented with an image. Under real-time ultrasound guidance, this vein was accessed with a 21 gauge micropuncture needle and image documentation was performed. A small dermatotomy was made at the access site with an 11 scalpel. A 0.018" wire was advanced into the SVC and used to estimate the length of the internal catheter. The access needle exchanged for a 76F micropuncture vascular sheath. The 0.018" wire was then removed and a 0.035" wire advanced into the IVC. An  appropriate location for the subcutaneous reservoir was selected below the clavicle and an incision was made through the skin and underlying soft tissues. The subcutaneous tissues were then dissected using a combination of blunt and sharp surgical technique and a pocket was formed. A single lumen power injectable portacatheter was then tunneled through the subcutaneous tissues from the pocket to the dermatotomy and the port reservoir placed within the subcutaneous pocket. The venous access site was then serially dilated and a peel away vascular sheath placed over the wire. The wire was removed and the port catheter advanced into position under fluoroscopic guidance. The catheter tip is positioned in the cavoatrial junction. This was documented with a spot image. The portacatheter was then tested and found to flush and aspirate well. The port was flushed with saline followed by 100 units/mL heparinized saline. The  pocket was then closed in two layers using first subdermal inverted interrupted absorbable sutures followed by a running subcuticular suture. The epidermis was then sealed with Dermabond. The dermatotomy at the venous access site was also seal with Dermabond. Patient tolerated the procedure well and remained hemodynamically stable throughout. No complications encountered and no significant blood loss encountered IMPRESSION: Status post right IJ port catheter placement. Catheter ready for use. Signed, Dulcy Fanny. Dellia Nims, RPVI [facility] Radiology Electronically Signed   By: Corrie Mckusick D.O.   On: 08/11/2018 13:54    All questions were answered. The patient knows to call the clinic with any problems, questions or concerns. No barriers to learning was detected.  I spent 25 minutes counseling the patient face to face. The total time spent in the appointment was 30 minutes and more than 50% was on counseling and review of test results  Heath Lark,  MD 09/08/2018 9:02 AM

## 2018-09-08 NOTE — H&P (Addendum)
Radiation Oncology         [phone] ________________________________  History and physical examination  Name: Jordan Waters MRN: [id number]  Date: 09/08/2018  DOB: [birth year]   DIAGNOSIS:  stage IB-3 adenocarcinoma of the cervix  HISTORY OF PRESENT ILLNESS:: This is a very pleasant 43 year old female who recently has completed a course of external beam radiation therapy along with radiosensitizing chemotherapy for management of her locally advanced cervical cancer.  Initially presented with extensive bleeding requiring a hospital admission.  She has had placement of Monsel's on 3 separate occasions for bleeding issues.  Patient's vaginal bleeding has subsided at this time.  She did have a lot of side effects with her radiation and chemotherapy.  Patient will be taken to the operating room on August 5 for exam under anesthesia and placement of tandem ring for high-dose-rate radiation therapy.  The patient will also be examined by Dr. Denman George to see if the patient will be a candidate for extrafascial hysterectomy after 1 brachii therapy procedure  PREVIOUS RADIATION THERAPY: No  PAST MEDICAL HISTORY:  has a past medical history of Depression with anxiety, Migraine, Pelvic mass in female (07/28/2018), and Photophobia of both eyes (04/08/2018).    PAST SURGICAL HISTORY: Past Surgical History:  Procedure Laterality Date   IR IMAGING GUIDED PORT INSERTION  08/11/2018   WISDOM TOOTH EXTRACTION      FAMILY HISTORY: family history includes Heart disease in her father, paternal grandfather, and paternal uncle; Multiple sclerosis in her mother and sister.  SOCIAL HISTORY:  reports that she quit smoking about 6 months ago. Her smoking use included cigarettes. She has never used smokeless tobacco. She reports previous alcohol use. She reports that she does not use drugs.  ALLERGIES: Patient has no known allergies.  MEDICATIONS:  No current facility-administered medications for this encounter.     Current Outpatient Medications  Medication Sig Dispense Refill   Ascorbic Acid (VITAMIN C) 100 MG tablet Take 100 mg by mouth daily.     dexamethasone (DECADRON) 4 MG tablet Take 1 tablet (4 mg total) by mouth daily. 20 tablet 0   diphenoxylate-atropine (LOMOTIL) 2.5-0.025 MG tablet Take 1 tablet by mouth 4 (four) times daily as needed for diarrhea or loose stools. 60 tablet 9   docusate sodium (COLACE) 100 MG capsule Take 1 capsule (100 mg total) by mouth 2 (two) times daily. 60 capsule 1   DULoxetine (CYMBALTA) 60 MG capsule Take 1 capsule (60 mg total) by mouth daily. 30 capsule 3   ferrous sulfate 325 (65 FE) MG tablet Take 1 tablet (325 mg total) by mouth 2 (two) times daily with a meal. 60 tablet 3   gabapentin (NEURONTIN) 100 MG capsule Take 1 capsule (100 mg total) by mouth 3 (three) times daily. PATIENT NEEDS PHYSICAL PRIOR TO ANY FURTHER REFILLS 90 capsule 0   hydrocortisone (ANUSOL-HC) 25 MG suppository Place 1 suppository (25 mg total) rectally 2 (two) times daily. 12 suppository 0   lidocaine-prilocaine (EMLA) cream Apply to affected area once 30 g 3   loperamide (IMODIUM A-D) 2 MG tablet Take 2 mg by mouth 4 (four) times daily as needed.     Multiple Vitamin (MULTIVITAMIN WITH MINERALS) TABS tablet Take 1 tablet by mouth daily.     ondansetron (ZOFRAN) 8 MG tablet Take 1 tablet (8 mg total) by mouth every 8 (eight) hours as needed. 30 tablet 1   prochlorperazine (COMPAZINE) 10 MG tablet Take 1 tablet (10 mg total) by mouth every 6 (six)  hours as needed (Nausea or vomiting). 30 tablet 1   scopolamine (TRANSDERM-SCOP) 1 MG/3DAYS Place 1 patch (1.5 mg total) onto the skin every 3 (three) days. 10 patch 12    REVIEW OF SYSTEMS:  REVIEW OF SYSTEMS: A 10+ POINT REVIEW OF SYSTEMS WAS OBTAINED including neurology, dermatology, psychiatry, cardiac, respiratory, lymph, extremities, GI, GU, musculoskeletal, constitutional, reproductive, HEENT. All pertinent positives are noted  in the HPI. All others are negative.  Patient denies any significant pelvic pain.  She does complain of bloated feeling in the lower abdominal region.  No reports of low back or flank pain.  She denies any cough shortness of breath or breathing difficulties.  She has had some headaches recently with stress of the overall situation and occasional syncopal feelings.  she reports approximately 15 pound weight gain over the past few months related to COVID-19 issues  PHYSICAL EXAM:  General: Alert and oriented, in no acute distress HEENT: Head is normocephalic. Extraocular movements are intact. Oropharynx is clear. Neck: Neck is supple, no palpable cervical or supraclavicular lymphadenopathy. Heart: Regular in rate and rhythm with no murmurs, rubs, or gallops. Chest: Clear to auscultation bilaterally, with no rhonchi, wheezes, or rales. Abdomen: Soft, nontender, nondistended, with no rigidity or guarding. Extremities: No cyanosis or edema. Lymphatics: see Neck Exam Skin: No concerning lesions. Musculoskeletal: symmetric strength and muscle tone throughout. Neurologic: Cranial nerves II through XII are grossly intact. No obvious focalities. Speech is fluent. Coordination is intact. Psychiatric: Judgment and insight are intact. Affect is appropriate. Pelvic exam prior to initiation of treatment by Dr. Denman George revealed a 6 cm barrel-shaped, bulky, mobile lesion.  There was no parametrial extension or nodularity.   ECOG = 1    LABORATORY DATA:  Lab Results  Component Value Date   WBC 3.0 (L) 09/07/2018   HGB 8.8 (L) 09/07/2018   HCT 26.5 (L) 09/07/2018   MCV 92.7 09/07/2018   PLT 120 (L) 09/07/2018   NEUTROABS 2.3 09/07/2018   Lab Results  Component Value Date   NA 138 09/03/2018   K 4.3 09/03/2018   CL 105 09/03/2018   CO2 25 09/03/2018   GLUCOSE 92 09/03/2018   CREATININE 0.74 09/03/2018   CALCIUM 9.0 09/03/2018      RADIOGRAPHY: Ir Imaging Guided Port Insertion  Result Date:  08/11/2018 INDICATION: 43 year old female with a history of cervical carcinoma referred for port catheter placement EXAM: IMPLANTED PORT A CATH PLACEMENT WITH ULTRASOUND AND FLUOROSCOPIC GUIDANCE MEDICATIONS: 2 g Ancef; The antibiotic was administered within an appropriate time interval prior to skin puncture. ANESTHESIA/SEDATION: Moderate (conscious) sedation was employed during this procedure. A total of Versed 3.0 mg and Fentanyl 100 mcg was administered intravenously. Moderate Sedation Time: 21 minutes. The patient's level of consciousness and vital signs were monitored continuously by radiology nursing throughout the procedure under my direct supervision. FLUOROSCOPY TIME:  0 minutes, 6 seconds (0 mGy) COMPLICATIONS: None PROCEDURE: The procedure, risks, benefits, and alternatives were explained to the patient. Questions regarding the procedure were encouraged and answered. The patient understands and consents to the procedure. Ultrasound survey was performed with images stored and sent to PACs. The right neck and chest was prepped with chlorhexidine, and draped in the usual sterile fashion using maximum barrier technique (cap and mask, sterile gown, sterile gloves, large sterile sheet, hand hygiene and cutaneous antiseptic). Antibiotic prophylaxis was provided with 2.0g Ancef administered IV one hour prior to skin incision. Local anesthesia was attained by infiltration with 1% lidocaine without epinephrine. Ultrasound demonstrated  patency of the right internal jugular vein, and this was documented with an image. Under real-time ultrasound guidance, this vein was accessed with a 21 gauge micropuncture needle and image documentation was performed. A small dermatotomy was made at the access site with an 11 scalpel. A 0.018" wire was advanced into the SVC and used to estimate the length of the internal catheter. The access needle exchanged for a 51F micropuncture vascular sheath. The 0.018" wire was then removed  and a 0.035" wire advanced into the IVC. An appropriate location for the subcutaneous reservoir was selected below the clavicle and an incision was made through the skin and underlying soft tissues. The subcutaneous tissues were then dissected using a combination of blunt and sharp surgical technique and a pocket was formed. A single lumen power injectable portacatheter was then tunneled through the subcutaneous tissues from the pocket to the dermatotomy and the port reservoir placed within the subcutaneous pocket. The venous access site was then serially dilated and a peel away vascular sheath placed over the wire. The wire was removed and the port catheter advanced into position under fluoroscopic guidance. The catheter tip is positioned in the cavoatrial junction. This was documented with a spot image. The portacatheter was then tested and found to flush and aspirate well. The port was flushed with saline followed by 100 units/mL heparinized saline. The pocket was then closed in two layers using first subdermal inverted interrupted absorbable sutures followed by a running subcuticular suture. The epidermis was then sealed with Dermabond. The dermatotomy at the venous access site was also seal with Dermabond. Patient tolerated the procedure well and remained hemodynamically stable throughout. No complications encountered and no significant blood loss encountered IMPRESSION: Status post right IJ port catheter placement. Catheter ready for use. Signed, Dulcy Fanny. Dellia Nims, RPVI [facility] Radiology Electronically Signed   By: Corrie Mckusick D.O.   On: 08/11/2018 13:54             IMPRESSION: Clinical and radiographic stage IB-3 adenocarcinoma of the cervix   PLAN: Patient will be taken to the operating room on August 25 at 715 for exam under anesthesia and placement of tandem ring for high-dose-rate radiation therapy with iridium 192.  Procedure will be  performed initially in conjunction with Dr. Denman George and she will determine if patient would be a candidate for an extrafascial hysterectomy after limited brachii therapy treatment. ------------------------------------------------  Blair Promise, PhD, MD

## 2018-09-08 NOTE — Assessment & Plan Note (Signed)
The patient does not want to resume advanced home care IV fluids at home due to concern about recent leak around her port I recommend daily dexamethasone and scopolamine patch as needed

## 2018-09-08 NOTE — Assessment & Plan Note (Signed)
Her white count has improved with G-CSF support She does not need transfusion for now We will continue to monitor her blood count carefully

## 2018-09-09 ENCOUNTER — Other Ambulatory Visit (HOSPITAL_COMMUNITY): Payer: Self-pay | Admitting: Radiation Oncology

## 2018-09-09 DIAGNOSIS — C539 Malignant neoplasm of cervix uteri, unspecified: Secondary | ICD-10-CM

## 2018-09-10 ENCOUNTER — Inpatient Hospital Stay: Payer: Managed Care, Other (non HMO)

## 2018-09-10 ENCOUNTER — Other Ambulatory Visit: Payer: Self-pay

## 2018-09-10 DIAGNOSIS — C538 Malignant neoplasm of overlapping sites of cervix uteri: Secondary | ICD-10-CM | POA: Diagnosis not present

## 2018-09-10 DIAGNOSIS — D649 Anemia, unspecified: Secondary | ICD-10-CM

## 2018-09-10 LAB — CBC WITH DIFFERENTIAL/PLATELET
Abs Immature Granulocytes: 0 10*3/uL (ref 0.00–0.07)
Basophils Absolute: 0 10*3/uL (ref 0.0–0.1)
Basophils Relative: 0 %
Eosinophils Absolute: 0 10*3/uL (ref 0.0–0.5)
Eosinophils Relative: 0 %
HCT: 23.8 % — ABNORMAL LOW (ref 36.0–46.0)
Hemoglobin: 7.8 g/dL — ABNORMAL LOW (ref 12.0–15.0)
Immature Granulocytes: 0 %
Lymphocytes Relative: 19 %
Lymphs Abs: 0.2 10*3/uL — ABNORMAL LOW (ref 0.7–4.0)
MCH: 31.1 pg (ref 26.0–34.0)
MCHC: 32.8 g/dL (ref 30.0–36.0)
MCV: 94.8 fL (ref 80.0–100.0)
Monocytes Absolute: 0.2 10*3/uL (ref 0.1–1.0)
Monocytes Relative: 18 %
Neutro Abs: 0.6 10*3/uL — ABNORMAL LOW (ref 1.7–7.7)
Neutrophils Relative %: 63 %
Platelets: 79 10*3/uL — ABNORMAL LOW (ref 150–400)
RBC: 2.51 MIL/uL — ABNORMAL LOW (ref 3.87–5.11)
RDW: 15.5 % (ref 11.5–15.5)
WBC: 1 10*3/uL — ABNORMAL LOW (ref 4.0–10.5)
nRBC: 0 % (ref 0.0–0.2)

## 2018-09-10 LAB — COMPREHENSIVE METABOLIC PANEL
ALT: 10 U/L (ref 0–44)
AST: 12 U/L — ABNORMAL LOW (ref 15–41)
Albumin: 3.6 g/dL (ref 3.5–5.0)
Alkaline Phosphatase: 45 U/L (ref 38–126)
Anion gap: 7 (ref 5–15)
BUN: 9 mg/dL (ref 6–20)
CO2: 27 mmol/L (ref 22–32)
Calcium: 8.4 mg/dL — ABNORMAL LOW (ref 8.9–10.3)
Chloride: 104 mmol/L (ref 98–111)
Creatinine, Ser: 0.7 mg/dL (ref 0.44–1.00)
GFR calc Af Amer: >60 mL/min (ref >60)
GFR calc non Af Amer: >60 mL/min (ref >60)
Glucose, Bld: 97 mg/dL (ref 70–99)
Potassium: 3.7 mmol/L (ref 3.5–5.1)
Sodium: 138 mmol/L (ref 135–145)
Total Bilirubin: 0.2 mg/dL — ABNORMAL LOW (ref 0.3–1.2)
Total Protein: 5.9 g/dL — ABNORMAL LOW (ref 6.5–8.1)

## 2018-09-10 LAB — MAGNESIUM: Magnesium: 1.8 mg/dL (ref 1.7–2.4)

## 2018-09-10 LAB — SAMPLE TO BLOOD BANK

## 2018-09-11 ENCOUNTER — Inpatient Hospital Stay: Payer: Managed Care, Other (non HMO)

## 2018-09-11 ENCOUNTER — Other Ambulatory Visit: Payer: Self-pay | Admitting: Hematology and Oncology

## 2018-09-11 ENCOUNTER — Telehealth: Payer: Self-pay | Admitting: Oncology

## 2018-09-11 ENCOUNTER — Other Ambulatory Visit: Payer: Self-pay

## 2018-09-11 VITALS — BP 106/62 | HR 85 | Temp 98.7°F | Resp 16

## 2018-09-11 DIAGNOSIS — C538 Malignant neoplasm of overlapping sites of cervix uteri: Secondary | ICD-10-CM | POA: Diagnosis not present

## 2018-09-11 DIAGNOSIS — D61818 Other pancytopenia: Secondary | ICD-10-CM

## 2018-09-11 MED ORDER — FILGRASTIM-SNDZ 300 MCG/0.5ML IJ SOSY
300.0000 ug | PREFILLED_SYRINGE | Freq: Once | INTRAMUSCULAR | Status: AC
  Administered 2018-09-11: 300 ug via SUBCUTANEOUS
  Filled 2018-09-11: qty 0.5

## 2018-09-11 NOTE — Telephone Encounter (Signed)
Called Jordan Waters and arranged for injection for growth factor this afternoon at 4 pm and for 2 units of blood on Monday with lab at 7:45 for a new blood band.  She verbalized agreement of all appointments.

## 2018-09-12 ENCOUNTER — Other Ambulatory Visit (HOSPITAL_COMMUNITY)
Admission: RE | Admit: 2018-09-12 | Discharge: 2018-09-12 | Disposition: A | Discharge status: Home / Self Care | Payer: Managed Care, Other (non HMO) | Source: Ambulatory Visit | Attending: Radiation Oncology | Admitting: Radiation Oncology

## 2018-09-12 DIAGNOSIS — Z01812 Encounter for preprocedural laboratory examination: Secondary | ICD-10-CM | POA: Insufficient documentation

## 2018-09-12 DIAGNOSIS — Z20828 Contact with and (suspected) exposure to other viral communicable diseases: Secondary | ICD-10-CM | POA: Diagnosis not present

## 2018-09-13 LAB — SARS CORONAVIRUS 2 (TAT 6-24 HRS): SARS Coronavirus 2: NEGATIVE

## 2018-09-14 ENCOUNTER — Telehealth: Payer: Self-pay | Admitting: Hematology and Oncology

## 2018-09-14 ENCOUNTER — Inpatient Hospital Stay: Payer: Managed Care, Other (non HMO)

## 2018-09-14 ENCOUNTER — Other Ambulatory Visit: Payer: Self-pay | Admitting: Hematology and Oncology

## 2018-09-14 ENCOUNTER — Encounter: Payer: Self-pay | Admitting: Hematology and Oncology

## 2018-09-14 ENCOUNTER — Other Ambulatory Visit: Payer: Self-pay

## 2018-09-14 ENCOUNTER — Inpatient Hospital Stay (HOSPITAL_BASED_OUTPATIENT_CLINIC_OR_DEPARTMENT_OTHER): Payer: Managed Care, Other (non HMO) | Admitting: Hematology and Oncology

## 2018-09-14 ENCOUNTER — Encounter (HOSPITAL_BASED_OUTPATIENT_CLINIC_OR_DEPARTMENT_OTHER): Payer: Self-pay | Admitting: *Deleted

## 2018-09-14 VITALS — BP 100/55 | HR 78 | Temp 98.6°F | Resp 16

## 2018-09-14 DIAGNOSIS — C538 Malignant neoplasm of overlapping sites of cervix uteri: Secondary | ICD-10-CM

## 2018-09-14 DIAGNOSIS — D649 Anemia, unspecified: Secondary | ICD-10-CM

## 2018-09-14 DIAGNOSIS — R112 Nausea with vomiting, unspecified: Secondary | ICD-10-CM

## 2018-09-14 DIAGNOSIS — R197 Diarrhea, unspecified: Secondary | ICD-10-CM

## 2018-09-14 DIAGNOSIS — D61818 Other pancytopenia: Secondary | ICD-10-CM

## 2018-09-14 LAB — MAGNESIUM: Magnesium: 1.9 mg/dL (ref 1.7–2.4)

## 2018-09-14 LAB — COMPREHENSIVE METABOLIC PANEL WITH GFR
ALT: 10 U/L (ref 0–44)
AST: 10 U/L — ABNORMAL LOW (ref 15–41)
Albumin: 3.7 g/dL (ref 3.5–5.0)
Alkaline Phosphatase: 54 U/L (ref 38–126)
Anion gap: 9 (ref 5–15)
BUN: 8 mg/dL (ref 6–20)
CO2: 25 mmol/L (ref 22–32)
Calcium: 8.8 mg/dL — ABNORMAL LOW (ref 8.9–10.3)
Chloride: 104 mmol/L (ref 98–111)
Creatinine, Ser: 0.72 mg/dL (ref 0.44–1.00)
GFR calc Af Amer: >60 mL/min
GFR calc non Af Amer: >60 mL/min
Glucose, Bld: 90 mg/dL (ref 70–99)
Potassium: 4.6 mmol/L (ref 3.5–5.1)
Sodium: 138 mmol/L (ref 135–145)
Total Bilirubin: 0.3 mg/dL (ref 0.3–1.2)
Total Protein: 6.4 g/dL — ABNORMAL LOW (ref 6.5–8.1)

## 2018-09-14 LAB — CBC WITH DIFFERENTIAL/PLATELET
Abs Immature Granulocytes: 0.01 K/uL (ref 0.00–0.07)
Basophils Absolute: 0 K/uL (ref 0.0–0.1)
Basophils Relative: 0 %
Eosinophils Absolute: 0 K/uL (ref 0.0–0.5)
Eosinophils Relative: 0 %
HCT: 24.6 % — ABNORMAL LOW (ref 36.0–46.0)
Hemoglobin: 8.2 g/dL — ABNORMAL LOW (ref 12.0–15.0)
Immature Granulocytes: 1 %
Lymphocytes Relative: 11 %
Lymphs Abs: 0.1 K/uL — ABNORMAL LOW (ref 0.7–4.0)
MCH: 31.3 pg (ref 26.0–34.0)
MCHC: 33.3 g/dL (ref 30.0–36.0)
MCV: 93.9 fL (ref 80.0–100.0)
Monocytes Absolute: 0.1 K/uL (ref 0.1–1.0)
Monocytes Relative: 5 %
Neutro Abs: 1 K/uL — ABNORMAL LOW (ref 1.7–7.7)
Neutrophils Relative %: 83 %
Platelets: 57 K/uL — ABNORMAL LOW (ref 150–400)
RBC: 2.62 MIL/uL — ABNORMAL LOW (ref 3.87–5.11)
RDW: 15.3 % (ref 11.5–15.5)
WBC: 1.2 K/uL — ABNORMAL LOW (ref 4.0–10.5)
nRBC: 0 % (ref 0.0–0.2)

## 2018-09-14 LAB — PREPARE RBC (CROSSMATCH)

## 2018-09-14 LAB — ABO/RH: ABO/RH(D): B POS

## 2018-09-14 LAB — SAMPLE TO BLOOD BANK

## 2018-09-14 MED ORDER — FILGRASTIM-SNDZ 300 MCG/0.5ML IJ SOSY
300.0000 ug | PREFILLED_SYRINGE | Freq: Once | INTRAMUSCULAR | Status: AC
  Administered 2018-09-14: 300 ug via SUBCUTANEOUS
  Filled 2018-09-14: qty 0.5

## 2018-09-14 MED ORDER — SODIUM CHLORIDE 0.9% FLUSH
10.0000 mL | INTRAVENOUS | Status: AC | PRN
  Administered 2018-09-14: 10 mL
  Filled 2018-09-14: qty 10

## 2018-09-14 MED ORDER — HEPARIN SOD (PORK) LOCK FLUSH 100 UNIT/ML IV SOLN
500.0000 [IU] | Freq: Every day | INTRAVENOUS | Status: AC | PRN
  Administered 2018-09-14: 500 [IU]
  Filled 2018-09-14: qty 5

## 2018-09-14 MED ORDER — DIPHENHYDRAMINE HCL 25 MG PO CAPS
ORAL_CAPSULE | ORAL | Status: AC
  Filled 2018-09-14: qty 1

## 2018-09-14 MED ORDER — SODIUM CHLORIDE 0.9 % IV SOLN: Freq: Once | INTRAVENOUS | Status: DC

## 2018-09-14 MED ORDER — ONDANSETRON HCL 8 MG PO TABS: 8.0000 mg | ORAL_TABLET | Freq: Three times a day (TID) | ORAL | 1 refills | Status: DC | PRN

## 2018-09-14 MED ORDER — SODIUM CHLORIDE 0.9% IV SOLUTION
250.0000 mL | Freq: Once | INTRAVENOUS | Status: AC
  Administered 2018-09-14: 250 mL via INTRAVENOUS
  Filled 2018-09-14: qty 250

## 2018-09-14 MED ORDER — ONDANSETRON HCL 4 MG/2ML IJ SOLN: 8.0000 mg | Freq: Once | INTRAMUSCULAR | Status: DC

## 2018-09-14 MED ORDER — ACETAMINOPHEN 325 MG PO TABS
ORAL_TABLET | ORAL | Status: AC
  Filled 2018-09-14: qty 2

## 2018-09-14 MED ORDER — PROCHLORPERAZINE MALEATE 10 MG PO TABS: 10.0000 mg | ORAL_TABLET | Freq: Four times a day (QID) | ORAL | 1 refills | Status: DC | PRN

## 2018-09-14 MED ORDER — SODIUM CHLORIDE 0.9 % IV SOLN
Freq: Once | INTRAVENOUS | Status: AC
  Administered 2018-09-14: 09:00:00 via INTRAVENOUS
  Filled 2018-09-14: qty 250

## 2018-09-14 MED ORDER — DIPHENHYDRAMINE HCL 25 MG PO CAPS
25.0000 mg | ORAL_CAPSULE | Freq: Once | ORAL | Status: AC
  Administered 2018-09-14: 25 mg via ORAL

## 2018-09-14 MED ORDER — ACETAMINOPHEN 325 MG PO TABS
650.0000 mg | ORAL_TABLET | Freq: Once | ORAL | Status: AC
  Administered 2018-09-14: 650 mg via ORAL

## 2018-09-14 NOTE — Assessment & Plan Note (Addendum)
She has progressive pancytopenia due to recent treatment We will discontinue chemotherapy Due to significant decline in her blood count, she is at risk of bleeding and adverse side effects I recommend 1 unit of blood transfusion today and she agreed She also have 1 symptoms of blurriness of vision that could be related to severe anemia She will also receive 1 more dose of G-CSF support today I will see her again next week for further follow-up and transfusion support as needed

## 2018-09-14 NOTE — Assessment & Plan Note (Addendum)
Her nausea is slightly better controlled with scopolamine patch It has caused some blurriness of vision recently We will continue the same for now I will also prescribe IV fluids and IV antiemetics

## 2018-09-14 NOTE — Patient Instructions (Signed)
Blood Transfusion, Adult, Care After This sheet gives you information about how to care for yourself after your procedure. Your doctor may also give you more specific instructions. If you have problems or questions, contact your doctor. Follow these instructions at home:   Take over-the-counter and prescription medicines only as told by your doctor.  Go back to your normal activities as told by your doctor.  Follow instructions from your doctor about how to take care of the area where an IV tube was put into your vein (insertion site). Make sure you: ? Wash your hands with soap and water before you change your bandage (dressing). If there is no soap and water, use hand sanitizer. ? Change your bandage as told by your doctor.  Check your IV insertion site every day for signs of infection. Check for: ? More redness, swelling, or pain. ? More fluid or blood. ? Warmth. ? Pus or a bad smell. Contact a doctor if:  You have more redness, swelling, or pain around the IV insertion site.  You have more fluid or blood coming from the IV insertion site.  Your IV insertion site feels warm to the touch.  You have pus or a bad smell coming from the IV insertion site.  Your pee (urine) turns pink, red, or brown.  You feel weak after doing your normal activities. Get help right away if:  You have signs of a serious allergic or body defense (immune) system reaction, including: ? Itchiness. ? Hives. ? Trouble breathing. ? Anxiety. ? Pain in your chest or lower back. ? Fever, flushing, and chills. ? Fast pulse. ? Rash. ? Watery poop (diarrhea). ? Throwing up (vomiting). ? Dark pee. ? Serious headache. ? Dizziness. ? Stiff neck. ? Yellow color in your face or the white parts of your eyes (jaundice). Summary  After a blood transfusion, return to your normal activities as told by your doctor.  Every day, check for signs of infection where the IV tube was put into your vein.  Some  signs of infection are warm skin, more redness and pain, more fluid or blood, and pus or a bad smell where the needle went in.  Contact your doctor if you feel weak or have any unusual symptoms. This information is not intended to replace advice given to you by your health care provider. Make sure you discuss any questions you have with your health care provider. Document Released: 01/28/2014 Document Revised: 05/14/2017 Document Reviewed: 09/01/2015 Elsevier Patient Education  2020 [employer]  

## 2018-09-14 NOTE — Assessment & Plan Note (Signed)
She continues to have frequent diarrhea I advised her to continue taking Imodium and Lomotil Due to high risk of severe dehydration, should be received IV fluids today She has declined IV fluid support at home

## 2018-09-14 NOTE — Progress Notes (Signed)
Pt reports a "drastic" change in vision this weekend on Friday night 09/11/2018 , pt reports now needs "1.5-1.75 reading glasses, and did not need them at all before". Pt reports a headache and dizziness before she started to use the reading glasses. Dr Alvy Bimler notified.

## 2018-09-14 NOTE — Progress Notes (Signed)
Spoke w/ pt via phone for pre-op interview.  Npo after mn.  Arrive at 0530.  Current lab results dated 09-14-2018 in chart and epic.  Pt had covid test done 09-11-2018.  Will take colace, gabapentin, cymbalta, scopolamine patch, and if needed take diarrhea medication am dos w/ sips of water.

## 2018-09-14 NOTE — Assessment & Plan Note (Signed)
She tolerated treatment very poorly with severe pancytopenia, nausea, vomiting and diarrhea We will continue aggressive supportive care She has received multiple doses of G-CSF support along with transfusion I will see her again next week for further follow-up I will coordinate PET CT scan and appointment with her GYN surgeon at the completion of treatment

## 2018-09-14 NOTE — Telephone Encounter (Signed)
I talk with patient regarding schedule  

## 2018-09-14 NOTE — Progress Notes (Signed)
Jordan Waters OFFICE PROGRESS NOTE  Patient Care Team: Esaw Grandchild, NP as PCP - General (Family Medicine)  ASSESSMENT & PLAN:  Malignant neoplasm of overlapping sites of cervix [facility]) She tolerated treatment very poorly with severe pancytopenia, nausea, vomiting and diarrhea We will continue aggressive supportive care She has received multiple doses of G-CSF support along with transfusion I will see her again next week for further follow-up I will coordinate PET CT scan and appointment with her GYN surgeon at the completion of treatment  Pancytopenia, acquired [facility]) She has progressive pancytopenia due to recent treatment We will discontinue chemotherapy Due to significant decline in her blood count, she is at risk of bleeding and adverse side effects I recommend 1 unit of blood transfusion today and she agreed She also have 1 symptoms of blurriness of vision that could be related to severe anemia She will also receive 1 more dose of G-CSF support today I will see her again next week for further follow-up and transfusion support as needed  Nausea with vomiting Her nausea is slightly better controlled with scopolamine patch It has caused some blurriness of vision recently We will continue the same for now I will also prescribe IV fluids and IV antiemetics  Diarrhea She continues to have frequent diarrhea I advised her to continue taking Imodium and Lomotil Due to high risk of severe dehydration, should be received IV fluids today She has declined IV fluid support at home   Orders Placed This Encounter  Procedures  . ABO/Rh    INTERVAL HISTORY: Please see below for problem oriented charting. She is seen in the infusion room Last week, she was found to have severe pancytopenia but declined coming in for blood transfusion She received 1 dose of G-CSF support With scopolamine patch, she denies vomiting Her nausea is slightly better controlled She developed  one episode of double vision recently No syncopal episode She continues to have profuse diarrhea The patient denies any recent signs or symptoms of bleeding such as spontaneous epistaxis, hematuria or hematochezia. Denies neuropathy No recent fever or chills  SUMMARY OF ONCOLOGIC HISTORY: Oncology History  Malignant neoplasm of overlapping sites of cervix ([place])  07/26/2018 Imaging   MR pelvis 1. Poorly marginated/infiltrative 4.7 x 3.4 x 3.0 cm mass of the left uterine cervix with invasion of the cervical stroma, worrisome for FIGO stage IB3 cervical carcinoma. No frank parametrial, bladder or vaginal involvement. GYN ONC consultation suggested. 2. No pelvic adenopathy. 3. No suspicious ovarian or adnexal findings.   07/28/2018 Initial Diagnosis   Malignant neoplasm of overlapping sites of cervix [facility])   07/28/2018 Pathology Results   1. Cervix, biopsy, ectocervix - ADENOCARCINOMA. - SEE MICROSCOPIC DESCRIPTION 2. Endocervix, curettage - ADENOCARCINOMA. - SEE MICROSCOPIC DESCRIPTION Microscopic Comment 1. -2. Both of the specimens show adenocarcinoma with similar features and the morphology favors endocervical primary. An endometrial primary cannot be completely excluded.   08/10/2018 Procedure   Status post right IJ port catheter placement. Catheter ready for use.   08/17/2018 - 09/07/2018 Chemotherapy   The patient had 4 cycles of cisplatin for chemotherapy treatment, complicated by severe nausea, vomiting and pancytopenia      REVIEW OF SYSTEMS:   Constitutional: Denies fevers, chills or abnormal weight loss Eyes: Denies blurriness of vision Ears, nose, mouth, throat, and face: Denies mucositis or sore throat Respiratory: Denies cough, dyspnea or wheezes Cardiovascular: Denies palpitation, chest discomfort or lower extremity swelling Skin: Denies abnormal skin rashes Lymphatics: Denies new lymphadenopathy or  easy bruising Neurological:Denies numbness, tingling or new  weaknesses Behavioral/Psych: Mood is stable, no new changes  All other systems were reviewed with the patient and are negative.  I have reviewed the past medical history, past surgical history, social history and family history with the patient and they are unchanged from previous note.  ALLERGIES:  has No Known Allergies.  MEDICATIONS:  Current Outpatient Medications  Medication Sig Dispense Refill  . Ascorbic Acid (VITAMIN C) 100 MG tablet Take 100 mg by mouth daily.    Marland Kitchen dexamethasone (DECADRON) 4 MG tablet Take 1 tablet (4 mg total) by mouth daily. 20 tablet 0  . diphenoxylate-atropine (LOMOTIL) 2.5-0.025 MG tablet Take 1 tablet by mouth 4 (four) times daily as needed for diarrhea or loose stools. 60 tablet 9  . docusate sodium (COLACE) 100 MG capsule Take 1 capsule (100 mg total) by mouth 2 (two) times daily. 60 capsule 1  . DULoxetine (CYMBALTA) 60 MG capsule Take 1 capsule (60 mg total) by mouth daily. 30 capsule 3  . ferrous sulfate 325 (65 FE) MG tablet Take 1 tablet (325 mg total) by mouth 2 (two) times daily with a meal. 60 tablet 3  . gabapentin (NEURONTIN) 100 MG capsule Take 1 capsule (100 mg total) by mouth 3 (three) times daily. PATIENT NEEDS PHYSICAL PRIOR TO ANY FURTHER REFILLS 90 capsule 0  . hydrocortisone (ANUSOL-HC) 25 MG suppository Place 1 suppository (25 mg total) rectally 2 (two) times daily. 12 suppository 0  . lidocaine-prilocaine (EMLA) cream Apply to affected area once 30 g 3  . loperamide (IMODIUM A-D) 2 MG tablet Take 2 mg by mouth 4 (four) times daily as needed.    . Multiple Vitamin (MULTIVITAMIN WITH MINERALS) TABS tablet Take 1 tablet by mouth daily.    . ondansetron (ZOFRAN) 8 MG tablet Take 1 tablet (8 mg total) by mouth every 8 (eight) hours as needed. 60 tablet 1  . prochlorperazine (COMPAZINE) 10 MG tablet Take 1 tablet (10 mg total) by mouth every 6 (six) hours as needed (Nausea or vomiting). 60 tablet 1  . scopolamine (TRANSDERM-SCOP) 1 MG/3DAYS  Place 1 patch (1.5 mg total) onto the skin every 3 (three) days. 10 patch 12   No current facility-administered medications for this visit.    Facility-Administered Medications Ordered in Other Visits  Medication Dose Route Frequency Provider Last Rate Last Dose  . ondansetron (ZOFRAN) injection 8 mg  8 mg Intravenous Once Leelynd Maldonado, MD        PHYSICAL EXAMINATION: ECOG PERFORMANCE STATUS: 2 - Symptomatic, <50% confined to bed  There were no vitals filed for this visit. There were no vitals filed for this visit.  GENERAL:alert, no distress and comfortable.  She looks thin and debilitated SKIN: skin color, texture, turgor are normal, no rashes or significant lesions EYES: normal, Conjunctiva are pink and non-injected, sclera clear OROPHARYNX:no exudate, no erythema and lips, buccal mucosa, and tongue normal  NECK: supple, thyroid normal size, non-tender, without nodularity LYMPH:  no palpable lymphadenopathy in the cervical, axillary or inguinal LUNGS: clear to auscultation and percussion with normal breathing effort HEART: regular rate & rhythm and no murmurs and no lower extremity edema ABDOMEN:abdomen soft, non-tender and normal bowel sounds Musculoskeletal:no cyanosis of digits and no clubbing  NEURO: alert & oriented x 3 with fluent speech, no focal motor/sensory deficits  LABORATORY DATA:  I have reviewed the data as listed    Component Value Date/Time   NA 138 09/14/2018 0756   NA 140  03/20/2018 1237   K 4.6 09/14/2018 0756   CL 104 09/14/2018 0756   CO2 25 09/14/2018 0756   GLUCOSE 90 09/14/2018 0756   BUN 8 09/14/2018 0756   BUN 7 03/20/2018 1237   CREATININE 0.72 09/14/2018 0756   CREATININE 0.71 08/13/2018 1614   CALCIUM 8.8 (L) 09/14/2018 0756   PROT 6.4 (L) 09/14/2018 0756   PROT 7.0 03/20/2018 1237   ALBUMIN 3.7 09/14/2018 0756   ALBUMIN 3.8 04/08/2018 1641   AST 10 (L) 09/14/2018 0756   AST 13 (L) 08/13/2018 1614   ALT 10 09/14/2018 0756   ALT 8  08/13/2018 1614   ALKPHOS 54 09/14/2018 0756   BILITOT 0.3 09/14/2018 0756   BILITOT 0.3 08/13/2018 1614   GFRNONAA >60 09/14/2018 0756   GFRNONAA >60 08/13/2018 1614   GFRAA >60 09/14/2018 0756   GFRAA >60 08/13/2018 1614    No results found for: SPEP, UPEP  Lab Results  Component Value Date   WBC 1.2 (L) 09/14/2018   NEUTROABS 1.0 (L) 09/14/2018   HGB 8.2 (L) 09/14/2018   HCT 24.6 (L) 09/14/2018   MCV 93.9 09/14/2018   PLT 57 (L) 09/14/2018      Chemistry      Component Value Date/Time   NA 138 09/14/2018 0756   NA 140 03/20/2018 1237   K 4.6 09/14/2018 0756   CL 104 09/14/2018 0756   CO2 25 09/14/2018 0756   BUN 8 09/14/2018 0756   BUN 7 03/20/2018 1237   CREATININE 0.72 09/14/2018 0756   CREATININE 0.71 08/13/2018 1614      Component Value Date/Time   CALCIUM 8.8 (L) 09/14/2018 0756   ALKPHOS 54 09/14/2018 0756   AST 10 (L) 09/14/2018 0756   AST 13 (L) 08/13/2018 1614   ALT 10 09/14/2018 0756   ALT 8 08/13/2018 1614   BILITOT 0.3 09/14/2018 0756   BILITOT 0.3 08/13/2018 1614       All questions were answered. The patient knows to call the clinic with any problems, questions or concerns. No barriers to learning was detected.  I spent 30 minutes counseling the patient face to face. The total time spent in the appointment was 40 minutes and more than 50% was on counseling and review of test results  Heath Lark, MD 09/14/2018 3:13 PM

## 2018-09-15 ENCOUNTER — Ambulatory Visit
Admission: RE | Admit: 2018-09-15 | Discharge: 2018-09-15 | Disposition: A | Discharge status: Home / Self Care | Payer: Managed Care, Other (non HMO) | Source: Ambulatory Visit | Attending: Radiation Oncology | Admitting: Radiation Oncology

## 2018-09-15 ENCOUNTER — Ambulatory Visit (HOSPITAL_BASED_OUTPATIENT_CLINIC_OR_DEPARTMENT_OTHER)
Admission: RE | Admit: 2018-09-15 | Discharge: 2018-09-15 | Disposition: A | Discharge status: Other Acute Inpatient Hospital | Payer: Managed Care, Other (non HMO) | Attending: Radiation Oncology | Admitting: Radiation Oncology

## 2018-09-15 ENCOUNTER — Encounter (HOSPITAL_BASED_OUTPATIENT_CLINIC_OR_DEPARTMENT_OTHER): Payer: Self-pay

## 2018-09-15 ENCOUNTER — Ambulatory Visit (HOSPITAL_BASED_OUTPATIENT_CLINIC_OR_DEPARTMENT_OTHER): Payer: Managed Care, Other (non HMO) | Admitting: Anesthesiology

## 2018-09-15 ENCOUNTER — Encounter (HOSPITAL_BASED_OUTPATIENT_CLINIC_OR_DEPARTMENT_OTHER)
Admission: RE | Disposition: A | Discharge status: Other Acute Inpatient Hospital | Payer: Self-pay | Source: Home / Self Care | Attending: Radiation Oncology

## 2018-09-15 ENCOUNTER — Ambulatory Visit (HOSPITAL_COMMUNITY)
Admission: RE | Admit: 2018-09-15 | Discharge: 2018-09-15 | Disposition: A | Discharge status: Home / Self Care | Payer: Managed Care, Other (non HMO) | Source: Ambulatory Visit | Attending: Radiation Oncology | Admitting: Radiation Oncology

## 2018-09-15 ENCOUNTER — Encounter: Payer: Self-pay | Admitting: Radiation Oncology

## 2018-09-15 DIAGNOSIS — F329 Major depressive disorder, single episode, unspecified: Secondary | ICD-10-CM | POA: Insufficient documentation

## 2018-09-15 DIAGNOSIS — Z923 Personal history of irradiation: Secondary | ICD-10-CM | POA: Insufficient documentation

## 2018-09-15 DIAGNOSIS — Z87891 Personal history of nicotine dependence: Secondary | ICD-10-CM | POA: Diagnosis not present

## 2018-09-15 DIAGNOSIS — C538 Malignant neoplasm of overlapping sites of cervix uteri: Secondary | ICD-10-CM

## 2018-09-15 DIAGNOSIS — Z79899 Other long term (current) drug therapy: Secondary | ICD-10-CM | POA: Diagnosis not present

## 2018-09-15 DIAGNOSIS — C539 Malignant neoplasm of cervix uteri, unspecified: Secondary | ICD-10-CM

## 2018-09-15 DIAGNOSIS — F419 Anxiety disorder, unspecified: Secondary | ICD-10-CM | POA: Diagnosis not present

## 2018-09-15 HISTORY — PX: OPERATIVE ULTRASOUND: SHX5996

## 2018-09-15 HISTORY — DX: Diarrhea, unspecified: R19.7

## 2018-09-15 HISTORY — DX: Nausea with vomiting, unspecified: R11.2

## 2018-09-15 HISTORY — DX: Anemia, unspecified: D64.9

## 2018-09-15 HISTORY — DX: Personal history of antineoplastic chemotherapy: Z92.21

## 2018-09-15 HISTORY — DX: Other pancytopenia: D61.818

## 2018-09-15 HISTORY — DX: Adverse effect of antineoplastic and immunosuppressive drugs, initial encounter: T45.1X5A

## 2018-09-15 HISTORY — PX: TANDEM RING INSERTION: SHX6199

## 2018-09-15 HISTORY — DX: Personal history of irradiation: Z92.3

## 2018-09-15 HISTORY — DX: Neuralgia and neuritis, unspecified: M79.2

## 2018-09-15 HISTORY — DX: Malignant neoplasm of overlapping sites of cervix uteri: C53.8

## 2018-09-15 LAB — TYPE AND SCREEN
ABO/RH(D): B POS
Antibody Screen: NEGATIVE
Unit division: 0

## 2018-09-15 LAB — BPAM RBC
Blood Product Expiration Date: 202009152359
ISSUE DATE / TIME: 202008241057
Unit Type and Rh: 7300

## 2018-09-15 SURGERY — INSERTION, UTERINE TANDEM AND RING OR CYLINDER, FOR BRACHYTHERAPY
Anesthesia: General

## 2018-09-15 MED ORDER — LACTATED RINGERS IV SOLN
INTRAVENOUS | Status: DC
  Administered 2018-09-15: 10:00:00 via INTRAVENOUS
  Filled 2018-09-15 (×2): qty 250

## 2018-09-15 MED ORDER — FENTANYL CITRATE (PF) 100 MCG/2ML IJ SOLN
INTRAMUSCULAR | Status: AC
  Filled 2018-09-15: qty 2

## 2018-09-15 MED ORDER — PROPOFOL 10 MG/ML IV BOLUS
INTRAVENOUS | Status: AC
  Filled 2018-09-15: qty 40

## 2018-09-15 MED ORDER — FENTANYL CITRATE (PF) 100 MCG/2ML IJ SOLN
INTRAMUSCULAR | Status: DC | PRN
  Administered 2018-09-15: 50 ug via INTRAVENOUS

## 2018-09-15 MED ORDER — SODIUM CHLORIDE 0.9 % IR SOLN
Status: DC | PRN
  Administered 2018-09-15: 1000 mL

## 2018-09-15 MED ORDER — OXYCODONE HCL 5 MG PO TABS
5.0000 mg | ORAL_TABLET | Freq: Once | ORAL | Status: DC | PRN
  Filled 2018-09-15: qty 1

## 2018-09-15 MED ORDER — PROMETHAZINE HCL 25 MG/ML IJ SOLN
6.2500 mg | INTRAMUSCULAR | Status: DC | PRN
  Filled 2018-09-15: qty 1

## 2018-09-15 MED ORDER — MIDAZOLAM HCL 2 MG/2ML IJ SOLN
INTRAMUSCULAR | Status: DC | PRN
  Administered 2018-09-15: 2 mg via INTRAVENOUS

## 2018-09-15 MED ORDER — HYDROMORPHONE HCL 1 MG/ML IJ SOLN
0.5000 mg | Freq: Once | INTRAMUSCULAR | Status: AC
  Administered 2018-09-15: 0.5 mg via INTRAVENOUS
  Filled 2018-09-15: qty 1

## 2018-09-15 MED ORDER — ESTRADIOL 0.1 MG/GM VA CREA
TOPICAL_CREAM | VAGINAL | Status: AC
  Filled 2018-09-15: qty 42.5

## 2018-09-15 MED ORDER — ARTIFICIAL TEARS OPHTHALMIC OINT
TOPICAL_OINTMENT | OPHTHALMIC | Status: AC
  Filled 2018-09-15: qty 3.5

## 2018-09-15 MED ORDER — HYDROMORPHONE HCL 1 MG/ML IJ SOLN
0.5000 mg | Freq: Once | INTRAMUSCULAR | Status: AC
  Administered 2018-09-15: 13:00:00 0.5 mg via INTRAVENOUS
  Filled 2018-09-15: qty 1

## 2018-09-15 MED ORDER — DEXAMETHASONE SODIUM PHOSPHATE 10 MG/ML IJ SOLN
INTRAMUSCULAR | Status: DC | PRN
  Administered 2018-09-15: 5 mg via INTRAVENOUS

## 2018-09-15 MED ORDER — ONDANSETRON HCL 4 MG/2ML IJ SOLN
INTRAMUSCULAR | Status: AC
  Filled 2018-09-15: qty 2

## 2018-09-15 MED ORDER — ACETAMINOPHEN 325 MG PO TABS
ORAL_TABLET | ORAL | Status: DC | PRN
  Administered 2018-09-15: 1000 mg via ORAL

## 2018-09-15 MED ORDER — KETOROLAC TROMETHAMINE 30 MG/ML IJ SOLN
INTRAMUSCULAR | Status: AC
  Filled 2018-09-15: qty 1

## 2018-09-15 MED ORDER — OXYCODONE HCL 5 MG/5ML PO SOLN
5.0000 mg | Freq: Once | ORAL | Status: DC | PRN
  Filled 2018-09-15: qty 5

## 2018-09-15 MED ORDER — ACETAMINOPHEN 325 MG PO TABS
325.0000 mg | ORAL_TABLET | ORAL | Status: DC | PRN
  Filled 2018-09-15: qty 2

## 2018-09-15 MED ORDER — ONDANSETRON HCL 4 MG PO TABS
8.0000 mg | ORAL_TABLET | Freq: Once | ORAL | Status: AC
  Administered 2018-09-15: 8 mg via ORAL
  Filled 2018-09-15: qty 2

## 2018-09-15 MED ORDER — MEPERIDINE HCL 25 MG/ML IJ SOLN
6.2500 mg | INTRAMUSCULAR | Status: DC | PRN
  Filled 2018-09-15: qty 1

## 2018-09-15 MED ORDER — MIDAZOLAM HCL 2 MG/2ML IJ SOLN
INTRAMUSCULAR | Status: AC
  Filled 2018-09-15: qty 2

## 2018-09-15 MED ORDER — LACTATED RINGERS IV SOLN
INTRAVENOUS | Status: DC
  Administered 2018-09-15 (×3): via INTRAVENOUS
  Filled 2018-09-15: qty 1000

## 2018-09-15 MED ORDER — ONDANSETRON HCL 4 MG/2ML IJ SOLN
INTRAMUSCULAR | Status: DC | PRN
  Administered 2018-09-15: 4 mg via INTRAVENOUS

## 2018-09-15 MED ORDER — KETOROLAC TROMETHAMINE 30 MG/ML IJ SOLN
30.0000 mg | Freq: Once | INTRAMUSCULAR | Status: DC | PRN
  Filled 2018-09-15: qty 1

## 2018-09-15 MED ORDER — DEXAMETHASONE SODIUM PHOSPHATE 10 MG/ML IJ SOLN
INTRAMUSCULAR | Status: AC
  Filled 2018-09-15: qty 1

## 2018-09-15 MED ORDER — PROPOFOL 10 MG/ML IV BOLUS
INTRAVENOUS | Status: DC | PRN
  Administered 2018-09-15: 150 mg via INTRAVENOUS

## 2018-09-15 MED ORDER — PROCHLORPERAZINE MALEATE 10 MG PO TABS
10.0000 mg | ORAL_TABLET | Freq: Four times a day (QID) | ORAL | Status: DC | PRN
  Administered 2018-09-15: 13:00:00 10 mg via ORAL
  Filled 2018-09-15: qty 1

## 2018-09-15 MED ORDER — ACETAMINOPHEN 160 MG/5ML PO SOLN
325.0000 mg | ORAL | Status: DC | PRN
  Filled 2018-09-15: qty 20.3

## 2018-09-15 MED ORDER — LIDOCAINE 2% (20 MG/ML) 5 ML SYRINGE
INTRAMUSCULAR | Status: DC | PRN
  Administered 2018-09-15: 100 mg via INTRAVENOUS

## 2018-09-15 MED ORDER — KETOROLAC TROMETHAMINE 30 MG/ML IJ SOLN
INTRAMUSCULAR | Status: DC | PRN
  Administered 2018-09-15: 30 mg via INTRAVENOUS

## 2018-09-15 MED ORDER — FENTANYL CITRATE (PF) 100 MCG/2ML IJ SOLN
25.0000 ug | INTRAMUSCULAR | Status: DC | PRN
  Administered 2018-09-15 (×4): 25 ug via INTRAVENOUS
  Filled 2018-09-15: qty 1

## 2018-09-15 MED ORDER — ACETAMINOPHEN 500 MG PO TABS
ORAL_TABLET | ORAL | Status: AC
  Filled 2018-09-15: qty 2

## 2018-09-15 MED ORDER — LIDOCAINE 2% (20 MG/ML) 5 ML SYRINGE
INTRAMUSCULAR | Status: AC
  Filled 2018-09-15: qty 5

## 2018-09-15 SURGICAL SUPPLY — 19 items
BNDG CONFORM 2 STRL LF (GAUZE/BANDAGES/DRESSINGS) IMPLANT
COVER WAND RF STERILE (DRAPES) ×4 IMPLANT
DILATOR CANAL MILEX (MISCELLANEOUS) IMPLANT
GAUZE 4X4 16PLY RFD (DISPOSABLE) ×4 IMPLANT
GLOVE BIO SURGEON STRL SZ7.5 (GLOVE) ×8 IMPLANT
GOWN STRL REUS W/TWL LRG LVL3 (GOWN DISPOSABLE) ×4 IMPLANT
HOLDER FOLEY CATH W/STRAP (MISCELLANEOUS) ×4 IMPLANT
HOVERMATT SINGLE USE (MISCELLANEOUS) ×4 IMPLANT
IV NS 1000ML (IV SOLUTION) ×2
IV NS 1000ML BAXH (IV SOLUTION) ×2 IMPLANT
IV SET EXTENSION GRAVITY 40 LF (IV SETS) ×4 IMPLANT
KIT TURNOVER CYSTO (KITS) ×4 IMPLANT
PACK VAGINAL MINOR WOMEN LF (CUSTOM PROCEDURE TRAY) ×4 IMPLANT
PACKING VAGINAL (PACKING) IMPLANT
PAD ABD 8X10 STRL (GAUZE/BANDAGES/DRESSINGS) ×4 IMPLANT
PAD OB MATERNITY 4.3X12.25 (PERSONAL CARE ITEMS) IMPLANT
TOWEL OR 17X26 10 PK STRL BLUE (TOWEL DISPOSABLE) ×4 IMPLANT
TRAY FOLEY W/BAG SLVR 14FR (SET/KITS/TRAYS/PACK) ×4 IMPLANT
WATER STERILE IRR 500ML POUR (IV SOLUTION) ×4 IMPLANT

## 2018-09-15 NOTE — Anesthesia Preprocedure Evaluation (Addendum)
Anesthesia Evaluation  Patient identified by MRN, date of birth, ID band Patient awake    Reviewed: Allergy & Precautions, NPO status , Patient's Chart, lab work & pertinent test results  Airway Mallampati: I  TM Distance: >3 FB Neck ROM: Full    Dental no notable dental hx. (+) Teeth Intact, Implants, Dental Advisory Given,    Pulmonary former smoker,    Pulmonary exam normal breath sounds clear to auscultation       Cardiovascular negative cardio ROS Normal cardiovascular exam Rhythm:Regular Rate:Normal     Neuro/Psych  Headaches, PSYCHIATRIC DISORDERS Anxiety Depression    GI/Hepatic negative GI ROS, Neg liver ROS,   Endo/Other  negative endocrine ROS  Renal/GU negative Renal ROS  negative genitourinary   Musculoskeletal negative musculoskeletal ROS (+)   Abdominal Normal abdominal exam  (+)   Peds negative pediatric ROS (+)  Hematology  (+) Blood dyscrasia, anemia ,   Anesthesia Other Findings   Reproductive/Obstetrics negative OB ROS                            Anesthesia Physical Anesthesia Plan  ASA: II  Anesthesia Plan: General   Post-op Pain Management:    Induction:   PONV Risk Score and Plan: 4 or greater and Ondansetron, Dexamethasone, Scopolamine patch - Pre-op and Midazolam  Airway Management Planned: LMA  Additional Equipment:   Intra-op Plan:   Post-operative Plan: Extubation in OR  Informed Consent: I have reviewed the patients History and Physical, chart, labs and discussed the procedure including the risks, benefits and alternatives for the proposed anesthesia with the patient or authorized representative who has indicated his/her understanding and acceptance.     Dental advisory given  Plan Discussed with: CRNA  Anesthesia Plan Comments:         Anesthesia Quick Evaluation

## 2018-09-15 NOTE — Anesthesia Postprocedure Evaluation (Signed)
Anesthesia Post Note  Patient: Jordan Waters  Procedure(s) Performed: TANDEM RING INSERTION (N/A ) OPERATIVE ULTRASOUND (N/A ) EXAM UNDER ANESTHESIA     Patient location during evaluation: PACU Anesthesia Type: General Level of consciousness: awake and sedated Pain management: pain level controlled Vital Signs Assessment: post-procedure vital signs reviewed and stable Respiratory status: spontaneous breathing Cardiovascular status: stable Postop Assessment: no apparent nausea or vomiting Anesthetic complications: no    Last Vitals:  Vitals:   09/15/18 0830 09/15/18 0845  BP: 112/68 107/69  Pulse: 76 76  Resp: 15 13  Temp:    SpO2: 96% 99%    Last Pain:  Vitals:   09/15/18 0842  TempSrc:   PainSc: 5    Pain Goal: Patients Stated Pain Goal: 4 (09/15/18 0842)                 Huston Foley

## 2018-09-15 NOTE — Interval H&P Note (Signed)
History and Physical Interval Note:  09/15/2018 7:05 AM  [name]  has presented today for surgery, with the diagnosis of [place].  The various methods of treatment have been discussed with the patient and family. After consideration of risks, benefits and other options for treatment, the patient has consented to  Procedure(s): TANDEM RING INSERTION (N/A) OPERATIVE ULTRASOUND (N/A) as a surgical intervention.  The patient's history has been reviewed, patient examined, no change in status, stable for surgery.  I have reviewed the patient's chart and labs.  Questions were answered to the patient's satisfaction.     Gery Pray

## 2018-09-15 NOTE — Discharge Instructions (Signed)

## 2018-09-15 NOTE — Addendum Note (Signed)
Encounter addended by: Loma Sousa, RN on: 09/15/2018 12:38 PM  Actions taken: [facility] administration accepted

## 2018-09-15 NOTE — Op Note (Signed)
Procedure Note:  Preop Dx: stage IB3 cervical cancer (endocervix), s/p external beam radiation and radiosensitizing cddp. Postop Dx: same Procedure: examination under anesthesia Surgeon: Dorann Ou, MD EBL: none Specimens: none Complications: none Procedure Details: The patient received general anesthesia, placed in lithotomy. A speculum was inserted into the vagina. A normal ectocervix was identified. On examination the lower uterine segment and endocervix remained barreled circumferentially with good mobility and no parametrial disease palpable. The lower uterine segment/endocervix measures approximately 4cm.  She appears to have had good, though incomplete, response to external beam radiation. Extrafascial hysterectomy an option for local control after 1 brachytherapy implant.   Will set her up to see me in the office and schedule robotic total hysterectomy, BSO in 6 weeks.  Thereasa Solo, MD

## 2018-09-15 NOTE — Addendum Note (Signed)
Encounter addended by: Loma Sousa, RN on: 09/15/2018 1:11 PM  Actions taken: Order list changed, Diagnosis association updated

## 2018-09-15 NOTE — Addendum Note (Signed)
Encounter addended by: Loma Sousa, RN on: 09/15/2018 3:27 PM  Actions taken: Clinical Note Signed

## 2018-09-15 NOTE — Transfer of Care (Signed)
Immediate Anesthesia Transfer of Care Note  Patient: Jordan Waters  Procedure(s) Performed: TANDEM RING INSERTION (N/A ) OPERATIVE ULTRASOUND (N/A ) EXAM UNDER ANESTHESIA  Patient Location: PACU  Anesthesia Type:General  Level of Consciousness: awake, alert , oriented and patient cooperative  Airway & Oxygen Therapy: Patient Spontanous Breathing and Patient connected to nasal cannula oxygen  Post-op Assessment: Report given to RN and Post -op Vital signs reviewed and stable  Post vital signs: Reviewed and stable  Last Vitals:  Vitals Value Taken Time  BP    Temp    Pulse    Resp 16 09/15/18 0802  SpO2    Vitals shown include unvalidated device data.  Last Pain:  Vitals:   09/15/18 0534  TempSrc: Oral  PainSc: 0-No pain      Patients Stated Pain Goal: 5 ([id number])  Complications: No apparent anesthesia complications

## 2018-09-15 NOTE — Brief Op Note (Signed)
09/15/2018  8:22 AM  PATIENT:  Jordan Waters  43 y.o. female  PRE-OPERATIVE DIAGNOSIS:  MALIGNANAT NEOPLASM CERVIX  POST-OPERATIVE DIAGNOSIS:  MALIGNANAT NEOPLASM CERVIX  PROCEDURE:  Procedure(s): TANDEM RING INSERTION (N/A) OPERATIVE ULTRASOUND (N/A) EXAM UNDER ANESTHESIA  SURGEON:  Surgeon(s) and Role:    * Gery Pray, MD - Primary    * Everitt Amber, MD - Assisting  PHYSICIAN ASSISTANT:   ASSISTANTS: none   ANESTHESIA:   general  EBL:  5 mL   BLOOD ADMINISTERED:none  DRAINS: Urinary Catheter (Foley)   LOCAL MEDICATIONS USED:  NONE  SPECIMEN:  No Specimen  DISPOSITION OF SPECIMEN:  N/A  COUNTS:  YES  TOURNIQUET:  * No tourniquets in log *  DICTATION: Patient was taken to outpatient OR #3.  Timeout was performed for the procedure, estimated length of the procedure, preoperative medications and allergies. She was seen by Dr. Denman George and exam under anesthesia revealed the cervical mass to be decreased in size to approximately 4 cm.  It was felt that the patient would be a candidate for extrafascial hysterectomy after 1 brachii therapy treatment.  The patient was prepped and draped in the usual sterile fashion and placed in the dorsolithotomy position.  A Foley catheter was placed and approximately 250 cc of sterile water was placed in the bladder for imaging purposes.  Exam under anesthesia by myself revealed the cervical mass to be approximately 3.5 x 4 cm.  There is no parametrial extension.  The cervical mass was mobile.  Patient proceeded to undergo intraoperative ultrasound with dilation of the cervix and sounding of the uterus.  The uterus sounded to approximately 8 cm.  The uterus was noted to be mildly anteverted.  Ultrasound measurements of the cervical mass revealed the residual cervical mass on ultrasound to measure 3.2 cm in length, AP PA measurement 2.8 cm and transverse measurements 3.8 cm.  After dilation of the cervical os a 60 mm cervical sleeve was placed in  the endocervical canal and endometrial cavity.  She then had a 45 degree 60 mm tandem placed within the cervical sleeve.  This was then followed by placement of a 45 degree cervical ring with a small shielding In place.  The patient then had placement of a rectal paddle posteriorly.  No significant room was available for additional packing.  Patient tolerated the procedure well.  She will be transferred  to radiation oncology later this morning for planning and her brachii therapy treatment.  Given her good response to external beam radiation therapy and radiosensitizing cis-platinum, the patient will proceed with surgery rather than for additional brachii therapy treatments.  PLAN OF CARE: Transferred to radiation oncology for planning and treatment  PATIENT DISPOSITION:  PACU - hemodynamically stable.   Delay start of Pharmacological VTE agent (>24hrs) due to surgical blood loss or risk of bleeding: not applicable

## 2018-09-15 NOTE — Progress Notes (Signed)
  Radiation Oncology         [phone] ________________________________  Name: Jordan Waters MRN: [id number]  Date: 09/15/2018  DOB: Jan 13, 1976  SIMULATION AND TREATMENT PLANNING NOTE HDR BRACHYTHERAPY  DIAGNOSIS:  stage IB-3 adenocarcinoma of the cervix  NARRATIVE:  The patient was brought to the [place].  Identity was confirmed.  All relevant records and images related to the planned course of therapy were reviewed.  The patient freely provided informed written consent to proceed with treatment after reviewing the details related to the planned course of therapy. The consent form was witnessed and verified by the simulation staff.  Then, the patient was set-up in a stable reproducible  supine position for radiation therapy.  CT images were obtained.  Surface markings were placed.  The CT images were loaded into the planning software.  Then the target and avoidance structures were contoured.  Treatment planning then occurred.  The radiation prescription was entered and confirmed.   I have requested : Brachytherapy Isodose Plan and Dosimetry Calculations to plan the radiation distribution.    PLAN:  The patient will receive 5.5 Gy in 1 fraction directed at the high risk clinical target volume.  The patient will be treated with iridium 192 as the high-dose-rate source.  The tandem ring system will be used to deliver the treatments.    ________________________________  Blair Promise, PhD, MD

## 2018-09-15 NOTE — Progress Notes (Addendum)
0850: pt received from [facility] OP PACU and transported to [place]. Loma Sousa, RN BSN    0930: Pt received from CT Sim into R/O nursing room 1 on stretcher. Pt awake and aware of surroundings. VSS. Pt reports pain/discomfort in vaginal area. IVF infusing via PIV without difficulty. Foley patent and draining clear light yellow urine. Loma Sousa, RN BSN    1230: pt resting quietly.  Transferred to [place] suite for treatment. Loma Sousa, RN BSN   1300: Pt returned to R/O nursing room 1 via stretcher. Foley removed without difficulty. Pt tolerated well. Tandem and Ring removed per Dr. Sondra Come. Pt tolerated well. PIV removed with catheter tip intact, dressing applied. This RN assisted pt with dressing. Pt stated she felt nauseous and "woozy". This RN inquired if pt felt safe/stable enough for discharge to home/husband. Pt firm with wanting to go home. Assisted pt to husband/car via WC and two staff assist. Pt again reiterated that she felt stable enough to discharge. Pt aware of discharge instructions. Loma Sousa, RN BSN

## 2018-09-15 NOTE — Addendum Note (Signed)
Encounter addended by: Loma Sousa, RN on: 09/15/2018 1:12 PM  Actions taken: MAR administration accepted

## 2018-09-15 NOTE — Anesthesia Procedure Notes (Addendum)
Procedure Name: LMA Insertion Date/Time: 09/15/2018 7:18 AM Performed by: Wanita Chamberlain, CRNA Pre-anesthesia Checklist: Patient identified, Emergency Drugs available, Suction available and Patient being monitored Patient Re-evaluated:Patient Re-evaluated prior to induction Oxygen Delivery Method: Circle system utilized Preoxygenation: Pre-oxygenation with 100% oxygen Induction Type: IV induction Ventilation: Mask ventilation without difficulty LMA: LMA inserted LMA Size: 3.0 Number of attempts: 1 Placement Confirmation: breath sounds checked- equal and bilateral,  CO2 detector and positive ETCO2 Tube secured with: Tape Dental Injury: Teeth and Oropharynx as per pre-operative assessment

## 2018-09-15 NOTE — Progress Notes (Signed)
  Radiation Oncology         [phone] ________________________________  Name: Jordan Waters MRN: [id number]  Date: 09/15/2018  DOB: 10-17-1975  CC: Esaw Grandchild, NP  Esaw Grandchild, NP  HDR BRACHYTHERAPY NOTE  DIAGNOSIS: stage IB-3 adenocarcinoma of the cervix  NARRATIVE: The patient was brought to the HDR suite. Identity was confirmed. All relevant records and images related to the planned course of therapy were reviewed. The patient freely provided informed written consent to proceed with treatment after reviewing the details related to the planned course of therapy. The consent form was witnessed and verified by the simulation staff. Then, the patient was set-up in a stable reproducible supine position for radiation therapy. The tandem ring system was accessed and fiducial markers were placed within the tandem and ring.   Simple treatment device note: On the operating room the patient had construction of her custom tandem ring system. She will be treated with a 45 tandem/ring system. The patient had placement of a 60 mm tandem. A cervical ring with a small shielding was used for her treatment. A rectal paddle was also part of her custom set up device.  Verification simulation note: An AP and lateral film was obtained through the pelvis area. This was compared to the patient's planning films documenting accurate position of the tandem/ring system for treatment.  High-dose-rate brachytherapy treatment note:  The remote afterloading device was accessed through catheter system and attached to the tandem ring system. Patient then proceeded to undergo her first high-dose-rate treatment directed at the cervix. The patient was prescribed a dose of 5.5 gray to be delivered to the [place].Marland Kitchen Patient was treated with 2 channels using 28 dwell positions. Treatment time was 641.0 seconds. The patient tolerated the procedure well. After completion of her therapy, a radiation survey was performed  documenting return of the iridium source into the [employer] safe. The patient was then transferred to the nursing suite. She then had removal of the rectal paddle followed by the tandem and ring system. The patient tolerated the removal well.  PLAN: Patient has completed her radiation therapy.  She will proceed with extrafascial hysterectomy in approximately 6 weeks. ________________________________  Blair Promise, PhD, MD

## 2018-09-15 NOTE — Addendum Note (Signed)
Encounter addended by: Loma Sousa, RN on: 09/15/2018 12:37 PM  Actions taken: Order list changed, Diagnosis association updated

## 2018-09-16 ENCOUNTER — Encounter (HOSPITAL_BASED_OUTPATIENT_CLINIC_OR_DEPARTMENT_OTHER): Payer: Self-pay | Admitting: Radiation Oncology

## 2018-09-17 ENCOUNTER — Other Ambulatory Visit: Payer: Managed Care, Other (non HMO)

## 2018-09-17 ENCOUNTER — Telehealth: Payer: Self-pay | Admitting: Adult Health

## 2018-09-17 NOTE — Telephone Encounter (Signed)
Contacted Patient to set up annual CPE,--- Appt declined by patient due to New Dx of Cancer & treatment care.  --Forwarding message to medical asst.  --glh

## 2018-09-21 ENCOUNTER — Other Ambulatory Visit: Payer: Self-pay | Admitting: Hematology and Oncology

## 2018-09-21 ENCOUNTER — Other Ambulatory Visit: Payer: Self-pay

## 2018-09-21 ENCOUNTER — Inpatient Hospital Stay (HOSPITAL_BASED_OUTPATIENT_CLINIC_OR_DEPARTMENT_OTHER): Payer: Managed Care, Other (non HMO) | Admitting: Hematology and Oncology

## 2018-09-21 ENCOUNTER — Inpatient Hospital Stay: Payer: Managed Care, Other (non HMO)

## 2018-09-21 ENCOUNTER — Telehealth: Payer: Self-pay | Admitting: Hematology and Oncology

## 2018-09-21 ENCOUNTER — Encounter: Payer: Self-pay | Admitting: Hematology and Oncology

## 2018-09-21 DIAGNOSIS — D61818 Other pancytopenia: Secondary | ICD-10-CM | POA: Diagnosis not present

## 2018-09-21 DIAGNOSIS — C538 Malignant neoplasm of overlapping sites of cervix uteri: Secondary | ICD-10-CM

## 2018-09-21 DIAGNOSIS — R112 Nausea with vomiting, unspecified: Secondary | ICD-10-CM

## 2018-09-21 DIAGNOSIS — R197 Diarrhea, unspecified: Secondary | ICD-10-CM

## 2018-09-21 DIAGNOSIS — D649 Anemia, unspecified: Secondary | ICD-10-CM

## 2018-09-21 LAB — CBC WITH DIFFERENTIAL/PLATELET
Abs Immature Granulocytes: 0 10*3/uL (ref 0.00–0.07)
Basophils Absolute: 0 10*3/uL (ref 0.0–0.1)
Basophils Relative: 0 %
Eosinophils Absolute: 0 10*3/uL (ref 0.0–0.5)
Eosinophils Relative: 2 %
HCT: 28.6 % — ABNORMAL LOW (ref 36.0–46.0)
Hemoglobin: 9.6 g/dL — ABNORMAL LOW (ref 12.0–15.0)
Immature Granulocytes: 0 %
Lymphocytes Relative: 41 %
Lymphs Abs: 0.3 10*3/uL — ABNORMAL LOW (ref 0.7–4.0)
MCH: 30.7 pg (ref 26.0–34.0)
MCHC: 33.6 g/dL (ref 30.0–36.0)
MCV: 91.4 fL (ref 80.0–100.0)
Monocytes Absolute: 0.1 10*3/uL (ref 0.1–1.0)
Monocytes Relative: 15 %
Neutro Abs: 0.3 10*3/uL — CL (ref 1.7–7.7)
Neutrophils Relative %: 42 %
Platelets: 81 10*3/uL — ABNORMAL LOW (ref 150–400)
RBC: 3.13 MIL/uL — ABNORMAL LOW (ref 3.87–5.11)
RDW: 15.1 % (ref 11.5–15.5)
WBC: 0.7 10*3/uL — CL (ref 4.0–10.5)
nRBC: 0 % (ref 0.0–0.2)

## 2018-09-21 LAB — MAGNESIUM: Magnesium: 1.7 mg/dL (ref 1.7–2.4)

## 2018-09-21 LAB — SAMPLE TO BLOOD BANK

## 2018-09-21 MED ORDER — SODIUM CHLORIDE 0.9% FLUSH
10.0000 mL | Freq: Once | INTRAVENOUS | Status: AC
  Administered 2018-09-21: 10 mL
  Filled 2018-09-21: qty 10

## 2018-09-21 MED ORDER — HEPARIN SOD (PORK) LOCK FLUSH 100 UNIT/ML IV SOLN
500.0000 [IU] | Freq: Once | INTRAVENOUS | Status: AC
  Administered 2018-09-21: 09:00:00 500 [IU]
  Filled 2018-09-21: qty 5

## 2018-09-21 MED ORDER — SODIUM CHLORIDE 0.9% FLUSH
10.0000 mL | Freq: Once | INTRAVENOUS | Status: AC
  Administered 2018-09-21: 09:00:00 10 mL
  Filled 2018-09-21: qty 10

## 2018-09-21 MED ORDER — FILGRASTIM-SNDZ 300 MCG/0.5ML IJ SOSY
300.0000 ug | PREFILLED_SYRINGE | Freq: Once | INTRAMUSCULAR | Status: AC
  Administered 2018-09-21: 09:00:00 300 ug via SUBCUTANEOUS
  Filled 2018-09-21: qty 0.5

## 2018-09-21 NOTE — Assessment & Plan Note (Signed)
She has persistent pancytopenia from treatment We will proceed with G-CSF support today and tomorrow We discussed neutropenic precaution She does not need transfusion support

## 2018-09-21 NOTE — Assessment & Plan Note (Signed)
This is resolving I recommend discontinuation of scopolamine patch and to take antiemetics only as needed

## 2018-09-21 NOTE — Progress Notes (Signed)
[place] OFFICE PROGRESS NOTE  Patient Care Team: Esaw Grandchild, NP as PCP - General (Family Medicine)  ASSESSMENT & PLAN:  Malignant neoplasm of overlapping sites of cervix [facility]) She has completed all treatment and is currently recovering from recent side effects of treatment Her nausea has resolved Per discussion with GYN surgeon, I will order MRI of the pelvis at the end of next month She will follow with GYN surgeon to discuss surgery  Pancytopenia, acquired [facility]) She has persistent pancytopenia from treatment We will proceed with G-CSF support today and tomorrow We discussed neutropenic precaution She does not need transfusion support  Diarrhea This is resolving She will continue Imodium as needed  Nausea with vomiting This is resolving I recommend discontinuation of scopolamine patch and to take antiemetics only as needed   No orders of the defined types were placed in this encounter.   INTERVAL HISTORY: Please see below for problem oriented charting. She returns for further follow-up Most of her symptoms has improved She denies further nausea and vomiting Diarrhea is less She denies vaginal bleeding No recent infection, fever or chills  SUMMARY OF ONCOLOGIC HISTORY: Oncology History  Malignant neoplasm of overlapping sites of cervix ([place])  07/26/2018 Imaging   MR pelvis 1. Poorly marginated/infiltrative 4.7 x 3.4 x 3.0 cm mass of the left uterine cervix with invasion of the cervical stroma, worrisome for FIGO stage IB3 cervical carcinoma. No frank parametrial, bladder or vaginal involvement. GYN ONC consultation suggested. 2. No pelvic adenopathy. 3. No suspicious ovarian or adnexal findings.   07/28/2018 Initial Diagnosis   Malignant neoplasm of overlapping sites of cervix [facility])   07/28/2018 Pathology Results   1. Cervix, biopsy, ectocervix - ADENOCARCINOMA. - SEE MICROSCOPIC DESCRIPTION 2. Endocervix, curettage - ADENOCARCINOMA. - SEE  MICROSCOPIC DESCRIPTION Microscopic Comment 1. -2. Both of the specimens show adenocarcinoma with similar features and the morphology favors endocervical primary. An endometrial primary cannot be completely excluded.   08/10/2018 Procedure   Status post right IJ port catheter placement. Catheter ready for use.   08/17/2018 - 09/07/2018 Chemotherapy   The patient had 4 cycles of cisplatin for chemotherapy treatment, complicated by severe nausea, vomiting and pancytopenia      REVIEW OF SYSTEMS:   Constitutional: Denies fevers, chills or abnormal weight loss Eyes: Denies blurriness of vision Ears, nose, mouth, throat, and face: Denies mucositis or sore throat Respiratory: Denies cough, dyspnea or wheezes Cardiovascular: Denies palpitation, chest discomfort or lower extremity swelling Skin: Denies abnormal skin rashes Lymphatics: Denies new lymphadenopathy or easy bruising Neurological:Denies numbness, tingling or new weaknesses Behavioral/Psych: Mood is stable, no new changes  All other systems were reviewed with the patient and are negative.  I have reviewed the past medical history, past surgical history, social history and family history with the patient and they are unchanged from previous note.  ALLERGIES:  has No Known Allergies.  MEDICATIONS:  Current Outpatient Medications  Medication Sig Dispense Refill  . Ascorbic Acid (VITAMIN C) 100 MG tablet Take 100 mg by mouth daily.    . diphenoxylate-atropine (LOMOTIL) 2.5-0.025 MG tablet Take 1 tablet by mouth 4 (four) times daily as needed for diarrhea or loose stools. 60 tablet 9  . docusate sodium (COLACE) 100 MG capsule Take 1 capsule (100 mg total) by mouth 2 (two) times daily. 60 capsule 1  . DULoxetine (CYMBALTA) 60 MG capsule Take 1 capsule (60 mg total) by mouth daily. 30 capsule 3  . ferrous sulfate 325 (65 FE) MG  tablet Take 1 tablet (325 mg total) by mouth 2 (two) times daily with a meal. 60 tablet 3  . gabapentin  (NEURONTIN) 100 MG capsule Take 1 capsule (100 mg total) by mouth 3 (three) times daily. PATIENT NEEDS PHYSICAL PRIOR TO ANY FURTHER REFILLS 90 capsule 0  . hydrocortisone (ANUSOL-HC) 25 MG suppository Place 1 suppository (25 mg total) rectally 2 (two) times daily. (Patient not taking: Reported on 09/14/2018) 12 suppository 0  . lidocaine-prilocaine (EMLA) cream Apply to affected area once 30 g 3  . loperamide (IMODIUM A-D) 2 MG tablet Take 2 mg by mouth 4 (four) times daily as needed.    . Multiple Vitamin (MULTIVITAMIN WITH MINERALS) TABS tablet Take 1 tablet by mouth daily.    . ondansetron (ZOFRAN) 8 MG tablet Take 1 tablet (8 mg total) by mouth every 8 (eight) hours as needed. 60 tablet 1  . prochlorperazine (COMPAZINE) 10 MG tablet Take 1 tablet (10 mg total) by mouth every 6 (six) hours as needed (Nausea or vomiting). 60 tablet 1  . scopolamine (TRANSDERM-SCOP) 1 MG/3DAYS Place 1 patch (1.5 mg total) onto the skin every 3 (three) days. 10 patch 12   No current facility-administered medications for this visit.     PHYSICAL EXAMINATION: ECOG PERFORMANCE STATUS: 1 - Symptomatic but completely ambulatory  Vitals:   09/21/18 0851  BP: 109/61  Pulse: 84  Resp: 18  Temp: 99.1 F (37.3 C)  SpO2: 100%   Filed Weights   09/21/18 0851  Weight: 114 lb (51.7 kg)    GENERAL:alert, no distress and comfortable Musculoskeletal:no cyanosis of digits and no clubbing  NEURO: alert & oriented x 3 with fluent speech, no focal motor/sensory deficits  LABORATORY DATA:  I have reviewed the data as listed    Component Value Date/Time   NA 138 09/14/2018 0756   NA 140 03/20/2018 1237   K 4.6 09/14/2018 0756   CL 104 09/14/2018 0756   CO2 25 09/14/2018 0756   GLUCOSE 90 09/14/2018 0756   BUN 8 09/14/2018 0756   BUN 7 03/20/2018 1237   CREATININE 0.72 09/14/2018 0756   CREATININE 0.71 08/13/2018 1614   CALCIUM 8.8 (L) 09/14/2018 0756   PROT 6.4 (L) 09/14/2018 0756   PROT 7.0 03/20/2018 1237    ALBUMIN 3.7 09/14/2018 0756   ALBUMIN 3.8 04/08/2018 1641   AST 10 (L) 09/14/2018 0756   AST 13 (L) 08/13/2018 1614   ALT 10 09/14/2018 0756   ALT 8 08/13/2018 1614   ALKPHOS 54 09/14/2018 0756   BILITOT 0.3 09/14/2018 0756   BILITOT 0.3 08/13/2018 1614   GFRNONAA >60 09/14/2018 0756   GFRNONAA >60 08/13/2018 1614   GFRAA >60 09/14/2018 0756   GFRAA >60 08/13/2018 1614    No results found for: SPEP, UPEP  Lab Results  Component Value Date   WBC 0.7 (LL) 09/21/2018   NEUTROABS 0.3 (LL) 09/21/2018   HGB 9.6 (L) 09/21/2018   HCT 28.6 (L) 09/21/2018   MCV 91.4 09/21/2018   PLT 81 (L) 09/21/2018      Chemistry      Component Value Date/Time   NA 138 09/14/2018 0756   NA 140 03/20/2018 1237   K 4.6 09/14/2018 0756   CL 104 09/14/2018 0756   CO2 25 09/14/2018 0756   BUN 8 09/14/2018 0756   BUN 7 03/20/2018 1237   CREATININE 0.72 09/14/2018 0756   CREATININE 0.71 08/13/2018 1614      Component Value Date/Time   CALCIUM  8.8 (L) 09/14/2018 0756   ALKPHOS 54 09/14/2018 0756   AST 10 (L) 09/14/2018 0756   AST 13 (L) 08/13/2018 1614   ALT 10 09/14/2018 0756   ALT 8 08/13/2018 1614   BILITOT 0.3 09/14/2018 0756   BILITOT 0.3 08/13/2018 1614       RADIOGRAPHIC STUDIES: I have personally reviewed the radiological images as listed and agreed with the findings in the report. [place] Intraoperative  Result Date: 09/15/2018 CLINICAL DATA:  Ultrasound was provided for use by the ordering physician, and a technical charge was applied by the performing facility.  No radiologist interpretation/professional services rendered.    All questions were answered. The patient knows to call the clinic with any problems, questions or concerns. No barriers to learning was detected.  I spent 15 minutes counseling the patient face to face. The total time spent in the appointment was 20 minutes and more than 50% was on counseling and review of test results  Heath Lark, MD 09/21/2018 1:45  PM

## 2018-09-21 NOTE — Assessment & Plan Note (Signed)
This is resolving She will continue Imodium as needed

## 2018-09-21 NOTE — Patient Instructions (Signed)
Filgrastim, G-CSF injection What is this medicine? FILGRASTIM, G-CSF (fil GRA stim) is a granulocyte colony-stimulating factor that stimulates the growth of neutrophils, a type of white blood cell (WBC) important in the body's fight against infection. It is used to reduce the incidence of fever and infection in patients with certain types of cancer who are receiving chemotherapy that affects the bone marrow, to stimulate blood cell production for removal of WBCs from the body prior to a bone marrow transplantation, to reduce the incidence of fever and infection in patients who have severe chronic neutropenia, and to improve survival outcomes following high-dose radiation exposure that is toxic to the bone marrow. This medicine may be used for other purposes; ask your health care provider or pharmacist if you have questions. COMMON BRAND NAME(S): Neupogen, Nivestym, Zarxio What should I tell my health care provider before I take this medicine? They need to know if you have any of these conditions:  kidney disease  latex allergy  ongoing radiation therapy  sickle cell disease  an unusual or allergic reaction to filgrastim, pegfilgrastim, other medicines, foods, dyes, or preservatives  pregnant or trying to get pregnant  breast-feeding How should I use this medicine? This medicine is for injection under the skin or infusion into a vein. As an infusion into a vein, it is usually given by a health care professional in a hospital or clinic setting. If you get this medicine at home, you will be taught how to prepare and give this medicine. Refer to the Instructions for Use that come with your medication packaging. Use exactly as directed. Take your medicine at regular intervals. Do not take your medicine more often than directed. It is important that you put your used needles and syringes in a special sharps container. Do not put them in a trash can. If you do not have a sharps container, call your  pharmacist or healthcare provider to get one. Talk to your pediatrician regarding the use of this medicine in children. While this drug may be prescribed for children as young as 7 months for selected conditions, precautions do apply. Overdosage: If you think you have taken too much of this medicine contact a poison control center or emergency room at once. NOTE: This medicine is only for you. Do not share this medicine with others. What if I miss a dose? It is important not to miss your dose. Call your doctor or health care professional if you miss a dose. What may interact with this medicine? This medicine may interact with the following medications:  medicines that may cause a release of neutrophils, such as lithium This list may not describe all possible interactions. Give your health care provider a list of all the medicines, herbs, non-prescription drugs, or dietary supplements you use. Also tell them if you smoke, drink alcohol, or use illegal drugs. Some items may interact with your medicine. What should I watch for while using this medicine? You may need blood work done while you are taking this medicine. What side effects may I notice from receiving this medicine? Side effects that you should report to your doctor or health care professional as soon as possible:  allergic reactions like skin rash, itching or hives, swelling of the face, lips, or tongue  back pain  dizziness or feeling faint  fever  pain, redness, or irritation at site where injected  pinpoint red spots on the skin  shortness of breath or breathing problems  signs and symptoms of kidney injury   like trouble passing urine, change in the amount of urine, or red or dark-brown urine  stomach or side pain, or pain at the shoulder  swelling  tiredness  unusual bleeding or bruising Side effects that usually do not require medical attention (report to your doctor or health care professional if they continue or  are bothersome):  bone pain  cough  diarrhea  hair loss  headache  muscle pain This list may not describe all possible side effects. Call your doctor for medical advice about side effects. You may report side effects to FDA at 1-800-FDA-1088. Where should I keep my medicine? Keep out of the reach of children. Store in a refrigerator between 2 and 8 degrees C (36 and 46 degrees F). Do not freeze. Keep in carton to protect from light. Throw away this medicine if vials or syringes are left out of the refrigerator for more than 24 hours. Throw away any unused medicine after the expiration date. NOTE: This sheet is a summary. It may not cover all possible information. If you have questions about this medicine, talk to your doctor, pharmacist, or health care provider.  2020 Elsevier/Gold Standard (2017-11-06 16:55:01)  Coronavirus (COVID-19) Are you at risk?  Are you at risk for the Coronavirus (COVID-19)?  To be considered HIGH RISK for Coronavirus (COVID-19), you have to meet the following criteria:  . Traveled to [place], [place], [place], [place] or [place]; or in the [place] to [place], [place], [place], or [place]; and have fever, cough, and shortness of breath within the last 2 weeks of travel OR . Been in close contact with a person diagnosed with COVID-19 within the last 2 weeks and have fever, cough, and shortness of breath . IF YOU DO NOT MEET THESE CRITERIA, YOU ARE CONSIDERED LOW RISK FOR COVID-19.  What to do if you are HIGH RISK for COVID-19?  Marland Kitchen If you are having a medical emergency, call 911. . Seek medical care right away. Before you go to a doctor's office, urgent care or emergency department, call ahead and tell them about your recent travel, contact with someone diagnosed with COVID-19, and your symptoms. You should receive instructions from your physician's office regarding next steps of care.  . When you arrive at healthcare provider, tell the healthcare  staff immediately you have returned from visiting [place], [place], [place], [place] or [place]; or traveled in the [place] to [place], [place], [place], or [place]; in the last two weeks or you have been in close contact with a person diagnosed with COVID-19 in the last 2 weeks.   . Tell the health care staff about your symptoms: fever, cough and shortness of breath. . After you have been seen by a medical provider, you will be either: o Tested for (COVID-19) and discharged home on quarantine except to seek medical care if symptoms worsen, and asked to  - Stay home and avoid contact with others until you get your results (4-5 days)  - Avoid travel on public transportation if possible (such as bus, train, or airplane) or o Sent to the Emergency Department by EMS for evaluation, COVID-19 testing, and possible admission depending on your condition and test results.  What to do if you are LOW RISK for COVID-19?  Reduce your risk of any infection by using the same precautions used for avoiding the common cold or flu:  Marland Kitchen Wash your hands often with soap and warm water for at least 20 seconds.  If soap and water are not readily available, use an alcohol-based hand sanitizer with at least 60% alcohol.  . If coughing or sneezing, cover your mouth and nose by coughing or sneezing into the elbow areas of your shirt or coat, into a tissue or into your sleeve (not your hands). . Avoid shaking hands with others and consider head nods or verbal greetings only. . Avoid touching your eyes, nose, or mouth with unwashed hands.  . Avoid close contact with people who are sick. . Avoid places or events with large numbers of people in one location, like concerts or sporting events. . Carefully consider travel plans you have or are making. . If you are planning any travel outside or inside the [place], visit the CDC's Travelers' Health webpage for the latest health notices. . If you have some symptoms but not all  symptoms, continue to monitor at home and seek medical attention if your symptoms worsen. . If you are having a medical emergency, call 911.   [place] / e-Visit: [email or web address]         [facility]: [place] [facility]: [id number]                   [facility]: [phone]

## 2018-09-21 NOTE — Telephone Encounter (Signed)
Added injection for tomorrow per 8/31 schedule message. No los at this time. Left message for patient. Spoke with infusion patient has already left.

## 2018-09-21 NOTE — Assessment & Plan Note (Signed)
She has completed all treatment and is currently recovering from recent side effects of treatment Her nausea has resolved Per discussion with GYN surgeon, I will order MRI of the pelvis at the end of next month She will follow with GYN surgeon to discuss surgery

## 2018-09-22 ENCOUNTER — Inpatient Hospital Stay: Payer: Managed Care, Other (non HMO) | Attending: Hematology and Oncology

## 2018-09-22 ENCOUNTER — Encounter: Payer: Self-pay | Admitting: Oncology

## 2018-09-22 ENCOUNTER — Other Ambulatory Visit: Payer: Self-pay

## 2018-09-22 VITALS — BP 98/58 | HR 89 | Temp 98.3°F | Resp 18

## 2018-09-22 DIAGNOSIS — D61818 Other pancytopenia: Secondary | ICD-10-CM | POA: Insufficient documentation

## 2018-09-22 DIAGNOSIS — C538 Malignant neoplasm of overlapping sites of cervix uteri: Secondary | ICD-10-CM

## 2018-09-22 MED ORDER — FILGRASTIM-SNDZ 300 MCG/0.5ML IJ SOSY
300.0000 ug | PREFILLED_SYRINGE | Freq: Once | INTRAMUSCULAR | Status: AC
  Administered 2018-09-22: 14:00:00 300 ug via SUBCUTANEOUS
  Filled 2018-09-22: qty 0.5

## 2018-09-22 NOTE — Progress Notes (Signed)
Gave [facility] the scheduling number to schedule an MRI on 10/20/18.  Also scheduled follow up on 10/23/18 at 3:45 with Dr. Denman George.

## 2018-09-22 NOTE — Patient Instructions (Signed)

## 2018-09-24 NOTE — Progress Notes (Incomplete)
°  Patient Name: Jordan Waters MRN: [id number] DOB: April 09, 1975 Referring Physician: Everitt Amber (Profile Not Attached) Date of Service: 09/08/2018 [facility], [place]                                                        End Of Treatment Note  Diagnoses: C53.8-Malignant neoplasm of overlapping sites of cervix uteri  Cancer Staging: Stage IB-3  Intent: Curative  Radiation Treatment Dates: 07/31/2018 through 09/08/2018 Site Technique Total Dose Dose per Fx Completed Fx Beam Energies  Pelvis: Cervix_pelvis 3D 45/45 1.8 25/25 15X   09/15/2018: Brachytherapy/HDR, 45 tandem/ring system, 60 mm tandem / 5.5 Gy in 1 fraction  Narrative: The patient tolerated radiation therapy relatively well. She reported moderate to severe fatigue throughout treatment. In her second week of treatment, she began to experience vaginal bleeding. She proceeded to undergo placement of Monsel's along the cervical region, placed with a Foxx swab. She also reported severe diarrhea, up to 10 times a day, with no relief from imodium. She had problems with hemorrhoids. She began chemotherapy on 08/18/2018 and reported the pre-meds improved her radiation side effects-- diarrhea improved and was managed with medicine; vaginal bleeding resolved after about a week. The following week, she reported nausea with vomiting. At the end of treatment, she reported feeling uncomfortable and continued issues with her hemorrhoids.   She tolerated her HDR treatment well without any concerns.  Plan: The patient will follow-up with radiation oncology following anticipated extrafascial hysterectomy.  ________________________________________________   Blair Promise, PhD, MD  This document serves as a record of services personally performed by Gery Pray, MD. It was  created on his behalf by Wilburn Mylar, a trained medical scribe. The creation of this record is based on the scribe's personal observations and the provider's statements to them. This document has been checked and approved by the attending provider.

## 2018-10-08 ENCOUNTER — Telehealth: Payer: Self-pay | Admitting: Oncology

## 2018-10-08 NOTE — Telephone Encounter (Signed)
Jordan Waters called and asked when her port needs to be flushed.  She last had it accessed on 09/21/18.  Advised her that it should be flushed every 6-8 weeks.  She asked if it can be accessed for her MRI on 10/20/18.  Scheduled flush appointment for 8 am.

## 2018-10-13 MED FILL — SCOPOLAMINE 1 MG/3DAYS PT72: 1 | 30 days supply | Qty: 10 | Fill #1

## 2018-10-20 ENCOUNTER — Ambulatory Visit (HOSPITAL_COMMUNITY)
Admission: RE | Admit: 2018-10-20 | Discharge: 2018-10-20 | Disposition: A | Discharge status: Home / Self Care | Payer: Managed Care, Other (non HMO) | Source: Ambulatory Visit | Attending: Hematology and Oncology | Admitting: Hematology and Oncology

## 2018-10-20 ENCOUNTER — Inpatient Hospital Stay: Payer: Managed Care, Other (non HMO)

## 2018-10-20 ENCOUNTER — Other Ambulatory Visit: Payer: Self-pay

## 2018-10-20 DIAGNOSIS — C538 Malignant neoplasm of overlapping sites of cervix uteri: Secondary | ICD-10-CM | POA: Insufficient documentation

## 2018-10-20 IMAGING — MR MR PELVIS WO/W CM
10 of 14 series · 19 of 48 positions shown · IV contrast (gadavist)
Comparison: [DATE]

CLINICAL DATA: Followup cervical carcinoma. Recently completed
radiation therapy and chemotherapy.

EXAM:
MRI PELVIS WITHOUT AND WITH CONTRAST
TECHNIQUE: Multiplanar multisequence MR imaging of the pelvis was performed
both before and after administration of intravenous contrast.
CONTRAST:  5mL GADAVIST GADOBUTROL 1 MMOL/ML IV SOLN

[Series 3: T2 · coronal · 6.0mm · 0.86mm/px · 1 of 29 slices shown (1 of 4)]
[im 1/29]
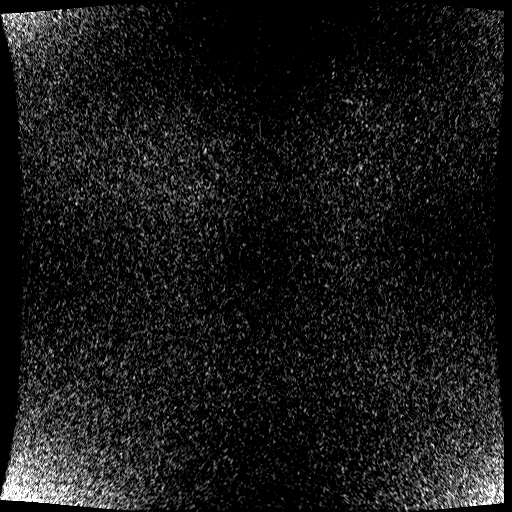

[Series 4: T2 · coronal · 5.0mm · 0.47mm/px · 1 of 32 slices shown (2 of 4)]
[im 1/32]
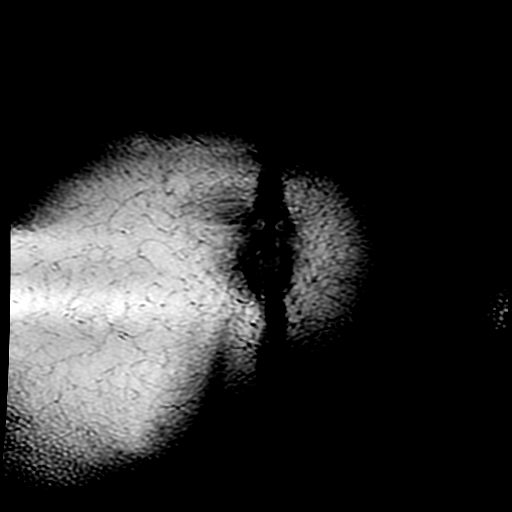

[Series 5: T2 · axial · 5.0mm · 0.47mm/px · z∈[-206,+22]mm · 2 of 39 slices shown (3 of 4)]
[im 1/39]
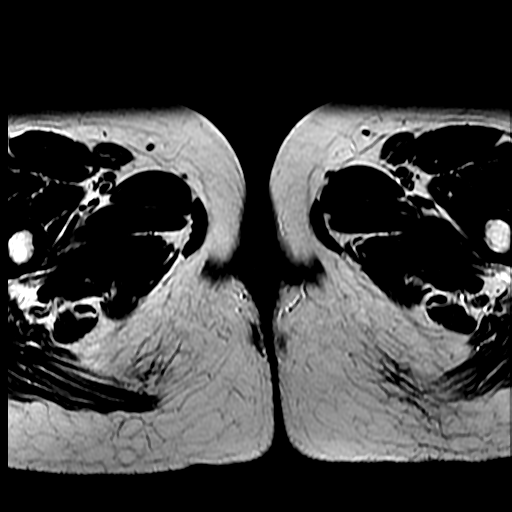
[im 39/39]
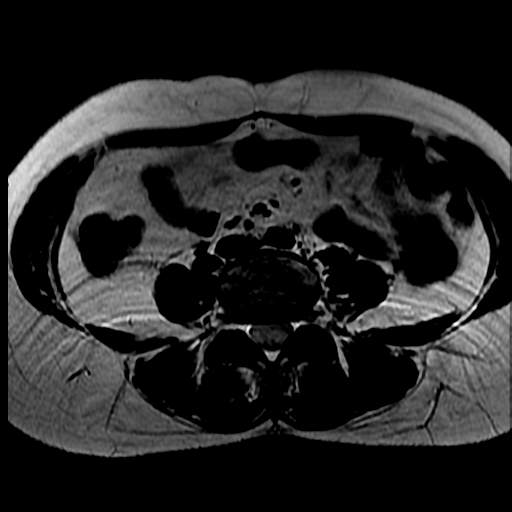

[Series 6: T2 fat-sat · axial · 5.0mm · 0.47mm/px · z∈[-206,+22]mm · 2 of 39 slices shown]
[im 1/39]
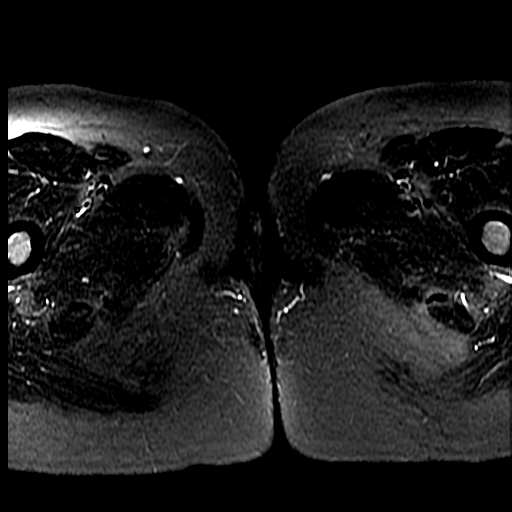
[im 39/39]
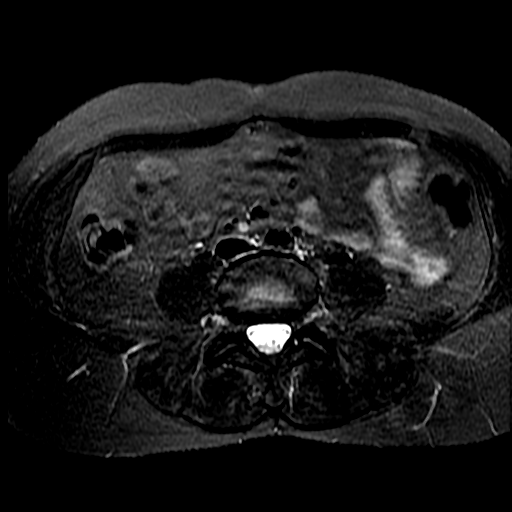

[Series 7: T2 · sagittal · 5.0mm · 0.47mm/px · 2 of 29 slices shown (4 of 4)]
[im 1/29]
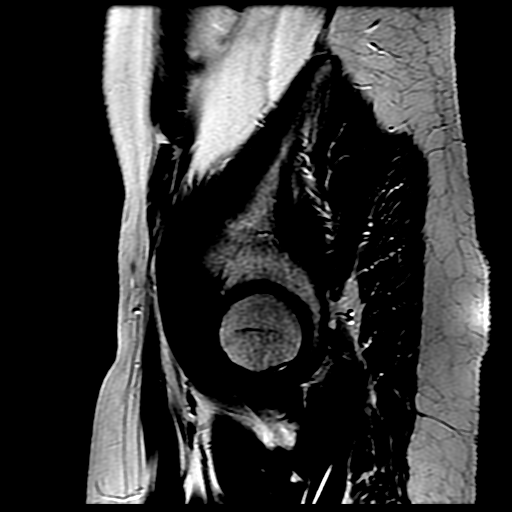
[im 29/29]
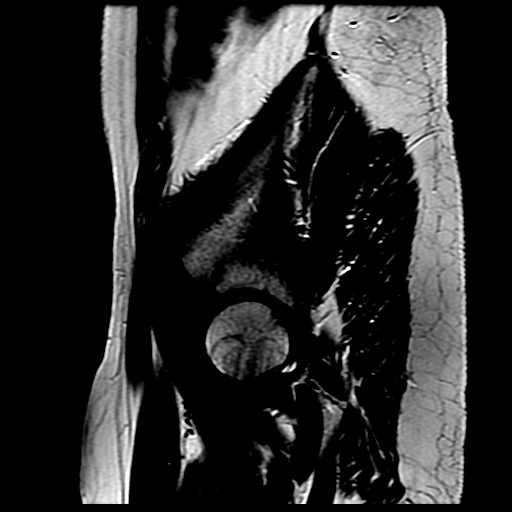

[Series 8: T1 · axial · 5.0mm · 0.47mm/px · z∈[-206,+22]mm · 3 of 39 slices shown]
[im 1/39]
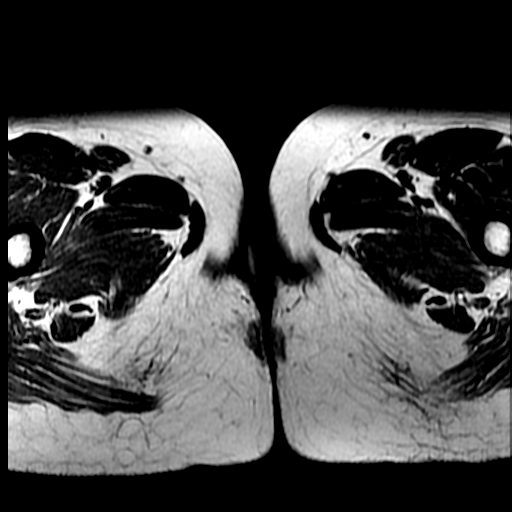
[im 20/39]
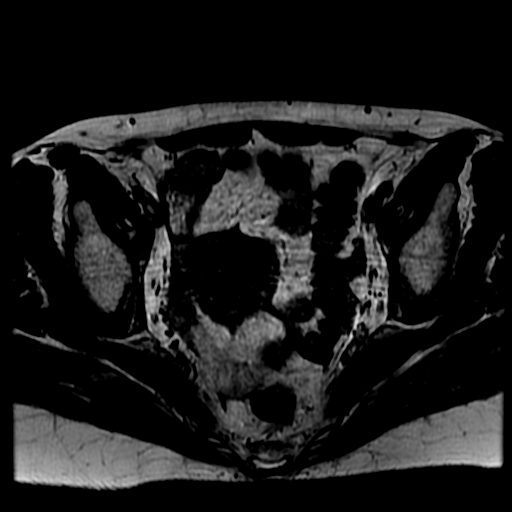
[im 39/39]
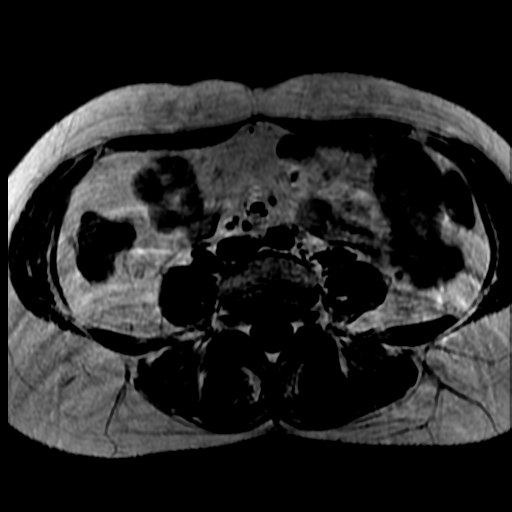

[Series 10: T2 post-contrast · sagittal · 5.0mm · 0.47mm/px · 2 of 29 slices shown]
[im 1/29]
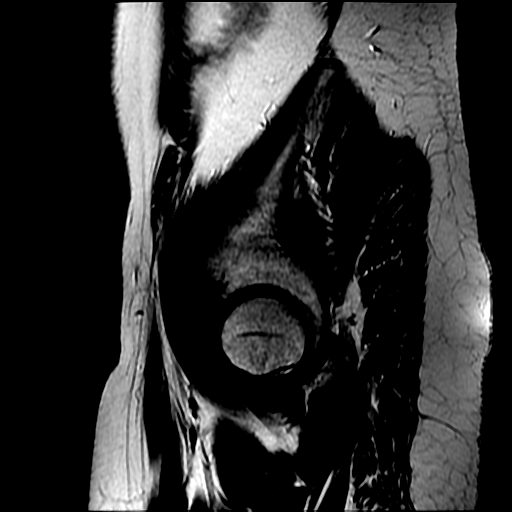
[im 29/29]
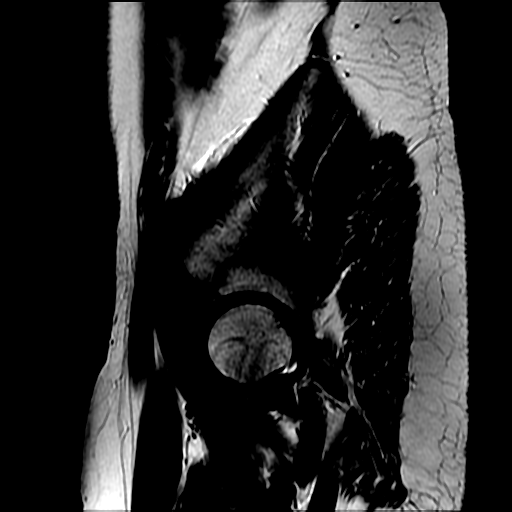

[Series 11: T1 fat-sat post-contrast · axial · 5.0mm · 0.47mm/px · z∈[-206,+22]mm · 3 of 39 slices shown (1 of 2)]
[im 1/39]
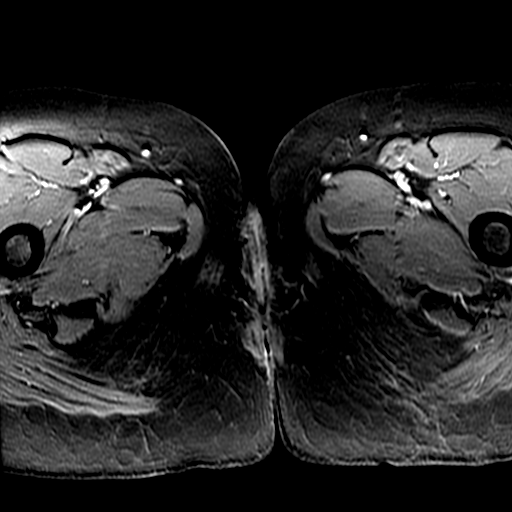
[im 20/39]
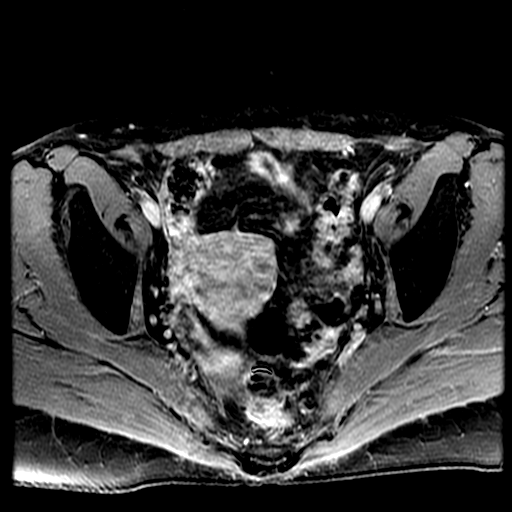
[im 39/39]
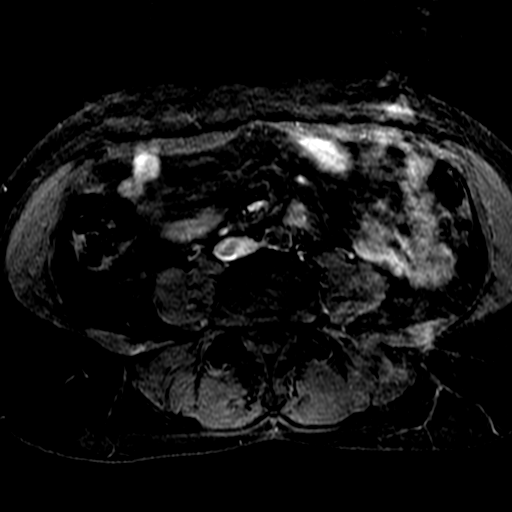

[Series 12: T1 fat-sat post-contrast · coronal · 5.0mm · 0.47mm/px · 2 of 32 slices shown (2 of 2)]
[im 1/32]
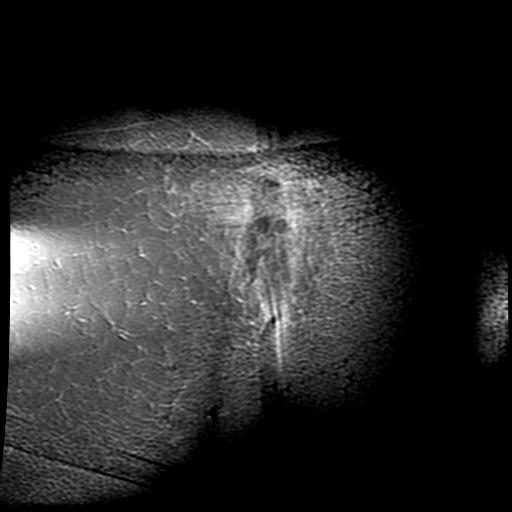
[im 32/32]
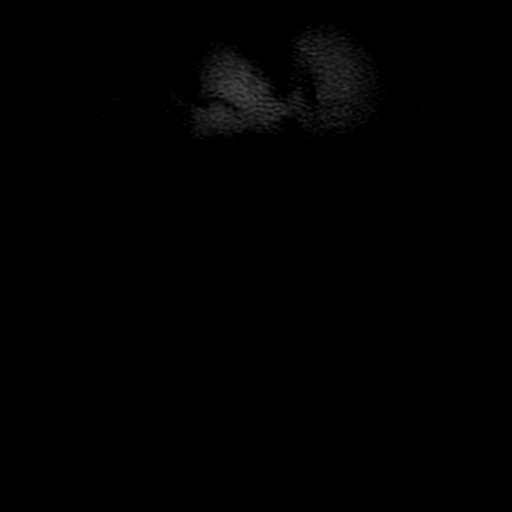

[Series 900: T1 dynamic · axial · 5.0mm · 0.47mm/px · 1 of 88 slices shown]
[im 1/88]
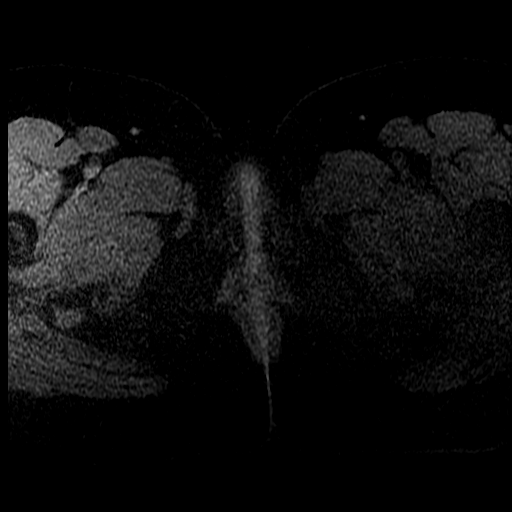

[19 of 48 positions shown; findings below may reference images not displayed]

FINDINGS: Lower Urinary Tract: No urinary bladder or urethral abnormality
identified.

Bowel: Unremarkable appearance of rectum and other pelvic bowel
loops.

Vascular/Lymphatic: Unremarkable. No pathologically enlarged pelvic
lymph nodes identified.

Reproductive:

-- Uterus: Measures 7.1 x 4.0 x 4.7 cm (volume = 70 cm^3). Two
tiny less than 1 cm intramural fibroids are seen in the right
posterior corpus and fundus. Small mass in the cervix showing
peripheral contrast enhancement and central area of necrosis has
significantly decreased in size since previous study. This currently
measures 2.1 x 1.7 cm by 1.8 cm on image 54/902 compared to 4.7 x
3.4 x 3.0 cm on previous study. No evidence of parametrial invasion.

-- Right ovary: Appears normal. No mass or inflammatory process
identified.

-- Left ovary: Appears normal. No mass or inflammatory process
identified.

Other: No peritoneal thickening or abnormal free fluid.

Musculoskeletal:  Unremarkable.
IMPRESSION: Significant decrease in size of cervical mass since previous study.
No evidence of parametrial invasion or other pelvic metastatic
disease.

Tiny less than 1 cm intramural fibroids in the right posterior
corpus and fundus.

## 2018-10-20 MED ORDER — HEPARIN SOD (PORK) LOCK FLUSH 100 UNIT/ML IV SOLN
INTRAVENOUS | Status: AC
  Filled 2018-10-20: qty 5

## 2018-10-20 MED ORDER — SODIUM CHLORIDE 0.9% FLUSH
10.0000 mL | Freq: Once | INTRAVENOUS | Status: AC | PRN
  Administered 2018-10-20: 08:00:00 10 mL
  Filled 2018-10-20: qty 10

## 2018-10-20 MED ORDER — HEPARIN SOD (PORK) LOCK FLUSH 100 UNIT/ML IV SOLN: 500.0000 [IU] | Freq: Once | INTRAVENOUS | Status: DC

## 2018-10-20 MED ORDER — GADOBUTROL 1 MMOL/ML IV SOLN
5.0000 mL | Freq: Once | INTRAVENOUS | Status: AC | PRN
  Administered 2018-10-20: 10:00:00 5 mL via INTRAVENOUS

## 2018-10-20 NOTE — Patient Instructions (Signed)

## 2018-10-23 ENCOUNTER — Inpatient Hospital Stay: Payer: Managed Care, Other (non HMO) | Attending: Radiation Oncology | Admitting: Gynecologic Oncology

## 2018-10-23 ENCOUNTER — Encounter: Payer: Self-pay | Admitting: Gynecologic Oncology

## 2018-10-23 ENCOUNTER — Other Ambulatory Visit: Payer: Self-pay

## 2018-10-23 ENCOUNTER — Other Ambulatory Visit: Payer: Self-pay | Admitting: Gynecologic Oncology

## 2018-10-23 VITALS — BP 108/63 | HR 74 | Temp 98.9°F | Resp 18 | Ht 61.0 in | Wt 120.0 lb

## 2018-10-23 DIAGNOSIS — C538 Malignant neoplasm of overlapping sites of cervix uteri: Secondary | ICD-10-CM

## 2018-10-23 DIAGNOSIS — Z90722 Acquired absence of ovaries, bilateral: Secondary | ICD-10-CM | POA: Insufficient documentation

## 2018-10-23 DIAGNOSIS — Z87891 Personal history of nicotine dependence: Secondary | ICD-10-CM | POA: Diagnosis not present

## 2018-10-23 DIAGNOSIS — Z90710 Acquired absence of both cervix and uterus: Secondary | ICD-10-CM | POA: Diagnosis not present

## 2018-10-23 DIAGNOSIS — Z79899 Other long term (current) drug therapy: Secondary | ICD-10-CM | POA: Diagnosis not present

## 2018-10-23 DIAGNOSIS — Z923 Personal history of irradiation: Secondary | ICD-10-CM | POA: Insufficient documentation

## 2018-10-23 DIAGNOSIS — Z9221 Personal history of antineoplastic chemotherapy: Secondary | ICD-10-CM | POA: Insufficient documentation

## 2018-10-23 MED ORDER — SENNOSIDES-DOCUSATE SODIUM 8.6-50 MG PO TABS: 2.0000 | ORAL_TABLET | Freq: Every day | ORAL | 0 refills | Status: DC

## 2018-10-23 MED ORDER — IBUPROFEN 800 MG PO TABS: 800.0000 mg | ORAL_TABLET | Freq: Three times a day (TID) | ORAL | 2 refills | Status: DC | PRN

## 2018-10-23 NOTE — Patient Instructions (Signed)
Preparing for your Surgery  Plan for surgery on October 29, 2018 with Dr. Everitt Amber at [place] will be scheduled for a robotic assisted total laparoscopic hysterectomy, bilateral salpingo-oophorectomy.   Pre-operative Testing -You will receive a phone call from presurgical testing at [facility] if you have not received a call already to arrange for a pre-operative testing appointment before your surgery.  This appointment normally occurs one to two weeks before your scheduled surgery.   -Bring your insurance card, copy of an advanced directive if applicable, medication list  -At that visit, you will be asked to sign a consent for a possible blood transfusion in case a transfusion becomes necessary during surgery.  The need for a blood transfusion is rare but having consent is a necessary part of your care.     -You should not be taking blood thinners or aspirin at least ten days prior to surgery unless instructed by your surgeon. Avoid ibuprofen use ten days before.  -Do not take supplements such as fish oil (omega 3), red yeast rice, tumeric before your surgery.  Day Before Surgery at [facility] will be asked to take in a light diet the day before surgery.  Avoid carbonated beverages.  You will be advised to have nothing to eat or drink after midnight the evening before.    Eat a light diet the day before surgery.  Examples including soups, broths, toast, yogurt, mashed potatoes.  Things to avoid include carbonated beverages (fizzy beverages), raw fruits and raw vegetables, or beans.   If your bowels are filled with gas, your surgeon will have difficulty visualizing your pelvic organs which increases your surgical risks.  Your role in recovery Your role is to become active as soon as directed by your doctor, while still giving yourself time to heal.  Rest when you feel tired. You will be asked to do the following in order to speed your recovery:  - Cough and  breathe deeply. This helps toclear and expand your lungs and can prevent pneumonia. You may be given a spirometer to practice deep breathing. A staff member will show you how to use the spirometer. - Do mild physical activity. Walking or moving your legs help your circulation and body functions return to normal. A staff member will help you when you try to walk and will provide you with simple exercises. Do not try to get up or walk alone the first time. - Actively manage your pain. Managing your pain lets you move in comfort. We will ask you to rate your pain on a scale of zero to 10. It is your responsibility to tell your doctor or nurse where and how much you hurt so your pain can be treated.  Special Considerations -If you are diabetic, you may be placed on insulin after surgery to have closer control over your blood sugars to promote healing and recovery.  This does not mean that you will be discharged on insulin.  If applicable, your oral antidiabetics will be resumed when you are tolerating a solid diet.  -Your final pathology results from surgery should be available around one week after surgery and the results will be relayed to you when available.  -Dr. Lahoma Crocker is the surgeon that assists your GYN Oncologist with surgery.  If you end up staying the night, the next day after your surgery you will either see Dr. Denman George or Dr. Lahoma Crocker.  -FMLA forms can be faxed to [phone] and  please allow 5-7 business days for completion.  Pain Management After Surgery -You have been prescribed your pain medication and bowel regimen medications before surgery so that you can have these available when you are discharged from the hospital. The pain medication is for use ONLY AFTER surgery and a new prescription will not be given.   -Make sure that you have Tylenol and Ibuprofen at home to use on a regular basis after surgery for pain control. We recommend alternating the medications  every hour to six hours since they work differently and are processed in the body differently for pain relief.  -Review the attached handout on narcotic use and their risks and side effects.   Bowel Regimen -You have been prescribed Sennakot-S to take nightly to prevent constipation especially if you are taking the narcotic pain medication intermittently.  It is important to prevent constipation and drink adequate amounts of liquids.  Blood Transfusion Information WHAT IS A BLOOD TRANSFUSION? A transfusion is the replacement of blood or some of its parts. Blood is made up of multiple cells which provide different functions.  Red blood cells carry oxygen and are used for blood loss replacement.  White blood cells fight against infection.  Platelets control bleeding.  Plasma helps clot blood.  Other blood products are available for specialized needs, such as hemophilia or other clotting disorders. BEFORE THE TRANSFUSION  Who gives blood for transfusions?   You may be able to donate blood to be used at a later date on yourself (autologous donation).  Relatives can be asked to donate blood. This is generally not any safer than if you have received blood from a stranger. The same precautions are taken to ensure safety when a relative's blood is donated.  Healthy volunteers who are fully evaluated to make sure their blood is safe. This is blood bank blood. Transfusion therapy is the safest it has ever been in the practice of medicine. Before blood is taken from a donor, a complete history is taken to make sure that person has no history of diseases nor engages in risky social behavior (examples are intravenous drug use or sexual activity with multiple partners). The donor's travel history is screened to minimize risk of transmitting infections, such as malaria. The donated blood is tested for signs of infectious diseases, such as HIV and hepatitis. The blood is then tested to be sure it is  compatible with you in order to minimize the chance of a transfusion reaction. If you or a relative donates blood, this is often done in anticipation of surgery and is not appropriate for emergency situations. It takes many days to process the donated blood. RISKS AND COMPLICATIONS Although transfusion therapy is very safe and saves many lives, the main dangers of transfusion include:   Getting an infectious disease.  Developing a transfusion reaction. This is an allergic reaction to something in the blood you were given. Every precaution is taken to prevent this. The decision to have a blood transfusion has been considered carefully by your caregiver before blood is given. Blood is not given unless the benefits outweigh the risks.  AFTER SURGERY INSTRUCTIONS 10/23/2018  Return to work: 4-6 weeks if applicable  Activity: 1. Be up and out of the bed during the day.  Take a nap if needed.  You may walk up steps but be careful and use the hand rail.  Stair climbing will tire you more than you think, you may need to stop part way and rest.  2. No lifting or straining for 6 weeks.  3. No driving for 1 week(s).  Do not drive if you are taking narcotic pain medicine.  4. Shower daily.  Use soap and water on your incision and pat dry; don't rub.  No tub baths until cleared by your surgeon.   5. No sexual activity and nothing in the vagina for 8 weeks.  6. You may experience a small amount of clear drainage from your incisions, which is normal.  If the drainage persists or increases, please call the office.  7. You may experience vaginal spotting after surgery or around the 6-8 week mark from surgery when the stitches at the top of the vagina begin to dissolve.  The spotting is normal but if you experience heavy bleeding, call our office.  8. Take Tylenol or ibuprofen first for pain and only use stronger pain meds for severe pain not relieved by the Tylenol or Ibuprofen.  Monitor your Tylenol  intake to a max of 4,000 mg a day.  Diet: 1. Low sodium Heart Healthy Diet is recommended.  2. It is safe to use a laxative, such as Miralax or Colace, if you have difficulty moving your bowels. You can take Sennakot at bedtime every evening to keep bowel movements regular and to prevent constipation.    Wound Care: 1. Keep clean and dry.  Shower daily.  Reasons to call the Doctor:  Fever - Oral temperature greater than 100.4 degrees Fahrenheit  Foul-smelling vaginal discharge  Difficulty urinating  Nausea and vomiting  Increased pain at the site of the incision that is unrelieved with pain medicine.  Difficulty breathing with or without chest pain  New calf pain especially if only on one side  Sudden, continuing increased vaginal bleeding with or without clots.   Contacts: For questions or concerns you should contact:  Dr. Everitt Amber at [phone]  [name], NP at [phone]  After Hours: call [phone] and have the GYN Oncologist paged/contacted

## 2018-10-23 NOTE — H&P (View-Only) (Signed)
Follow-up Note: Gyn-Onc  Consult was initially requested by Dr. Penny Pia and Dr Kennon Rounds for the evaluation of Jordan Waters 43 y.o. female  CC:  Chief Complaint  Patient presents with  . Malignant neoplasm of overlapping sites of cervix [facility])    Surgery Discussion    Assessment/Plan:  Jordan Waters  is a 43 y.o.  year old with history of stage IB3 cervical cancer s/p primary chemoradiation completed 09/21/18. She had an abbreviation of her brachii therapy due to planned completion extrafascial hysterectomy to reduce local recurrence risk.  Based on her MRI scans and her good response clinically I am recommending proceeding with extrafascial total hysterectomy with BSO 6 weeks post completion of radiation therapy (10/29/2018).  I discussed risks of surgery including  bleeding, infection, damage to internal organs (such as bladder,ureters, bowels), blood clot, reoperation and rehospitalization. I explained that there is particularly high risk for vaginal cuff dehiscence or vaginal cuff complications and fistula formation due to her history of radiation and complications or inhibition of healing.  I explained that the procedure would fall minimally invasively with robotic assistance and will be same-day discharge.  I explained anticipated recovery and postoperative restrictions.  HPI: Jordan Waters is a 43 year old P2 who is seen in consultation at the request of Dr Kennon Rounds for evaluation of a cervical mass and bleeding.  The patient reports the onset of heavy vaginal bleeding since Fathers' Day 2020. This resulted in syncopal feelings on 07/24/18 which led to her visiting the [facility] ER the same day. A TVUS was performed which showed a possible endometrial mass.  An Hb was drawn which showed severe acute blood loss anemia of 7.7mg /dL.  She was admitted, received 2 units PRBC (with an appropriate corresponding rise in Hb to 9.7) and then received an MRI on 07/26/18. This showed the mildly  enlarged anteverted uterus measures 10.7 x 5.4 x 5.5 cm. There is a 4.7 x 3.4 x 3.0 cm poorly marginated infiltrative mass centered in the anterior left uterine cervix which extends 12:00 to 4:00 in the lower cervix, and extends 12:00 to 8:00 in the upper cervix, demonstrating intermediate T2 signal intensity and enhancement. This mass demonstrates cervical stromal invasion of at least 6 mm with no frank parametrial invasion. Mass appears to invade the lower most portion of the uterine cavity. No evidence of vaginal or bladder invasion. Findings are worrisome for a FIGO stage IB3 cervical carcinoma.  She is an otherwise very healthy woman with no chronic medical disease.  She has never had abdominal surgery.   She had regular pap tests up until a year or two ago. Her only abnormal pap was in pregnancy approximately 20 years ago which resulted in what sounds like either a LEEP or CKC in pregnancy and subsequently normal paps after that time.   She has had 2 prior vaginal deliveries.  She has never been vaccinated against HPV.   Biopsy of the cervix on 07/28/18 showed adenocarcinoma of the cervix.  PET showed no apparent involvement outside of the cervix and uterus.   She commenced primary chemoradiation on 08/04/18 (though chemotherapy was delayed due to lack of port access).   Interval History:  She completed radiation to the pelvis and cervix with Dr. Gery Pray with radiosensitizing chemotherapy completed on September 15, 2018.  She received a single HDR tandem and ring brachii therapy treatment with anticipation for completion hysterectomy given her excellent response to therapy and stage I bulky disease.  The goal of  this treatment approach was to minimize long-term toxicity of excessive brachii therapy by debulking the primary site.  MRI was performed following completion of therapy on October 20, 2018 and revealed a uterus measuring 7.1 x 6 4 x 4.7 cm with a small mass in the cervix so  showing peripheral contrast-enhancement and a central area of necrosis that is significantly decreased in size.  It currently measures 2.1 x 1.7 cm.  The right and left ovaries were normal.  There is no peritoneal thickening or free fluid or lymphadenopathy appreciated.  She is no longer bleeding.     Current Meds:  Outpatient Encounter Medications as of 10/23/2018  Medication Sig  . Ascorbic Acid (VITAMIN C) 100 MG tablet Take 100 mg by mouth daily.  . ferrous sulfate 325 (65 FE) MG tablet Take 1 tablet (325 mg total) by mouth 2 (two) times daily with a meal.  . gabapentin (NEURONTIN) 100 MG capsule Take 1 capsule (100 mg total) by mouth 3 (three) times daily. PATIENT NEEDS PHYSICAL PRIOR TO ANY FURTHER REFILLS  . lidocaine-prilocaine (EMLA) cream Apply to affected area once  . loperamide (IMODIUM A-D) 2 MG tablet Take 2 mg by mouth 4 (four) times daily as needed.  . ondansetron (ZOFRAN) 8 MG tablet Take 1 tablet (8 mg total) by mouth every 8 (eight) hours as needed.  . prochlorperazine (COMPAZINE) 10 MG tablet Take 1 tablet (10 mg total) by mouth every 6 (six) hours as needed (Nausea or vomiting).  Marland Kitchen scopolamine (TRANSDERM-SCOP) 1 MG/3DAYS Place 1 patch (1.5 mg total) onto the skin every 3 (three) days.  . DULoxetine (CYMBALTA) 60 MG capsule Take 1 capsule (60 mg total) by mouth daily.  . hydrocortisone (ANUSOL-HC) 25 MG suppository Place 1 suppository (25 mg total) rectally 2 (two) times daily. (Patient not taking: Reported on 09/14/2018)  . ibuprofen (ADVIL) 800 MG tablet Take 1 tablet (800 mg total) by mouth every 8 (eight) hours as needed. For AFTER surgery only  . Multiple Vitamin (MULTIVITAMIN WITH MINERALS) TABS tablet Take 1 tablet by mouth daily.  Marland Kitchen senna-docusate (SENOKOT-S) 8.6-50 MG tablet Take 2 tablets by mouth at bedtime. For AFTER surgery, do not take if having diarrhea  . [DISCONTINUED] diphenoxylate-atropine (LOMOTIL) 2.5-0.025 MG tablet Take 1 tablet by mouth 4 (four) times  daily as needed for diarrhea or loose stools.  . [DISCONTINUED] docusate sodium (COLACE) 100 MG capsule Take 1 capsule (100 mg total) by mouth 2 (two) times daily.   No facility-administered encounter medications on file as of 10/23/2018.     Allergy: No Known Allergies  Social Hx:   Social History   Socioeconomic History  . Marital status: Married    Spouse name: Leane Para  . Number of children: 3  . Years of education: 47  . Highest education level: Not on file  Occupational History  . Not on file  Social Needs  . Financial resource strain: Not on file  . Food insecurity    Worry: Not on file    Inability: Not on file  . Transportation needs    Medical: Not on file    Non-medical: Not on file  Tobacco Use  . Smoking status: Former Smoker    Years: 8.00    Types: Cigarettes    Quit date: 02/15/2018    Years since quitting: 0.6  . Smokeless tobacco: Never Used  Substance and Sexual Activity  . Alcohol use: Not Currently  . Drug use: Never  . Sexual activity: Yes  Birth control/protection: Other-see comments    Comment: husband ---- vasectomy ;  abstinence since 06/ 2020  Lifestyle  . Physical activity    Days per week: Not on file    Minutes per session: Not on file  . Stress: Not on file  Relationships  . Social Herbalist on phone: Not on file    Gets together: Not on file    Attends religious service: Not on file    Active member of club or organization: Not on file    Attends meetings of clubs or organizations: Not on file    Relationship status: Not on file  . Intimate partner violence    Fear of current or ex partner: Not on file    Emotionally abused: Not on file    Physically abused: Not on file    Forced sexual activity: Not on file  Other Topics Concern  . Not on file  Social History Narrative   Lives with husband, children   Caffeine ,limited    Past Surgical Hx:  Past Surgical History:  Procedure Laterality Date  . IR IMAGING  GUIDED PORT INSERTION  08/11/2018  . OPERATIVE ULTRASOUND N/A 09/15/2018   Procedure: OPERATIVE ULTRASOUND;  Surgeon: Gery Pray, MD;  Location: [facility];  Service: Urology;  Laterality: N/A;  . TANDEM RING INSERTION N/A 09/15/2018   Procedure: TANDEM RING INSERTION;  Surgeon: Gery Pray, MD;  Location: [facility];  Service: Urology;  Laterality: N/A;  . WISDOM TOOTH EXTRACTION      Past Medical Hx:  Past Medical History:  Diagnosis Date  . Anemia   . Chemotherapy-induced nausea and vomiting    intermittant  . Depression with anxiety   . History of cancer chemotherapy    cervical cancer--- 08-17-2018 to 09-07-2018  . History of external beam radiation therapy    cervical cancer--- 08-13-2018 to 09-08-2018  . Intermittent diarrhea    secondary to radiation therapy  . Malignant neoplasm of overlapping sites of cervix [facility]) oncologist-- dr gorsuch/  dr Sondra Come   dx 07-28-2018--- Stage IB3--  completed chemotherapy 09-07-2018 and IMRT 09-08-2018  . Migraine   . Neuropathic pain   . Pancytopenia, acquired ([place])   . Pelvic mass in female 07/28/2018    Past Gynecological History:  See HPI  No LMP recorded. (Menstrual status: Perimenopausal).  Family Hx:  Family History  Problem Relation Age of Onset  . Heart disease Paternal Uncle   . Heart disease Paternal Grandfather   . Multiple sclerosis Mother   . Heart disease Father   . Multiple sclerosis Sister     Review of Systems:  Constitutional  Feels fatigued  ENT Normal appearing ears and nares bilaterally Skin/Breast  No rash, sores, jaundice, itching, dryness Cardiovascular  No chest pain, shortness of breath, or edema  Pulmonary  No cough or wheeze.  Gastro Intestinal  No nausea, vomitting, or diarrhoea. No bright red blood per rectum, no abdominal pain, change in bowel movement, or constipation.  Genito Urinary  No frequency, urgency, dysuria, + bleeding Musculo Skeletal  No  myalgia, arthralgia, joint swelling or pain  Neurologic  No weakness, numbness, change in gait,  Psychology  No depression, anxiety, insomnia.   Vitals:  Blood pressure 108/63, pulse 74, temperature 98.9 F (37.2 C), temperature source Temporal, resp. rate 18, height 5\' 1"  (1.549 m), weight 120 lb (54.4 kg), SpO2 100 %.  Physical Exam: WD in NAD Neck  Supple NROM, without any  enlargements.  Lymph Node Survey No cervical supraclavicular or inguinal adenopathy Cardiovascular  Pulse normal rate, regularity and rhythm. S1 and S2 normal.  Lungs  Clear to auscultation bilateraly, without wheezes/crackles/rhonchi. Good air movement.  Skin  No rash/lesions/breakdown  Psychiatry  Alert and oriented to person, place, and time  Abdomen  Normoactive bowel sounds, abdomen soft, non-tender and thin without evidence of hernia.  Back No CVA tenderness Genito Urinary  Vulva/vagina: Normal external female genitalia.   No lesions. No discharge  Bladder/urethra:  No lesions or masses, well supported bladder  Vagina: well supported  Cervix: smooth ectocervix with no visible tumor. Cervix soft, no parametrial disease.  Uterus: deferred  Adnexa: deferred Rectal  deferred Extremities  No bilateral cyanosis, clubbing or edema.  Jordan Solo, MD  10/23/2018, 4:55 PM

## 2018-10-23 NOTE — Progress Notes (Signed)
Follow-up Note: Gyn-Onc  Consult was initially requested by Dr. Penny Pia and Dr Kennon Rounds for the evaluation of [name] 43 y.o. female  CC:  Chief Complaint  Patient presents with  . Malignant neoplasm of overlapping sites of cervix [facility])    Surgery Discussion    Assessment/Plan:  Ms. Miechelle Devereaux  is a 43 y.o.  year old with history of stage IB3 cervical cancer s/p primary chemoradiation completed 09/21/18. She had an abbreviation of her brachii therapy due to planned completion extrafascial hysterectomy to reduce local recurrence risk.  Based on her MRI scans and her good response clinically I am recommending proceeding with extrafascial total hysterectomy with BSO 6 weeks post completion of radiation therapy (10/29/2018).  I discussed risks of surgery including  bleeding, infection, damage to internal organs (such as bladder,ureters, bowels), blood clot, reoperation and rehospitalization. I explained that there is particularly high risk for vaginal cuff dehiscence or vaginal cuff complications and fistula formation due to her history of radiation and complications or inhibition of healing.  I explained that the procedure would fall minimally invasively with robotic assistance and will be same-day discharge.  I explained anticipated recovery and postoperative restrictions.  HPI: Ms Silena Cortez is a 43 year old P2 who is seen in consultation at the request of Dr Kennon Rounds for evaluation of a cervical mass and bleeding.  The patient reports the onset of heavy vaginal bleeding since Fathers' Day 2020. This resulted in syncopal feelings on 07/24/18 which led to her visiting the [facility] ER the same day. A TVUS was performed which showed a possible endometrial mass.  An Hb was drawn which showed severe acute blood loss anemia of 7.7mg /dL.  She was admitted, received 2 units PRBC (with an appropriate corresponding rise in Hb to 9.7) and then received an MRI on 07/26/18. This showed the mildly  enlarged anteverted uterus measures 10.7 x 5.4 x 5.5 cm. There is a 4.7 x 3.4 x 3.0 cm poorly marginated infiltrative mass centered in the anterior left uterine cervix which extends 12:00 to 4:00 in the lower cervix, and extends 12:00 to 8:00 in the upper cervix, demonstrating intermediate T2 signal intensity and enhancement. This mass demonstrates cervical stromal invasion of at least 6 mm with no frank parametrial invasion. Mass appears to invade the lower most portion of the uterine cavity. No evidence of vaginal or bladder invasion. Findings are worrisome for a FIGO stage IB3 cervical carcinoma.  She is an otherwise very healthy woman with no chronic medical disease.  She has never had abdominal surgery.   She had regular pap tests up until a year or two ago. Her only abnormal pap was in pregnancy approximately 20 years ago which resulted in what sounds like either a LEEP or CKC in pregnancy and subsequently normal paps after that time.   She has had 2 prior vaginal deliveries.  She has never been vaccinated against HPV.   Biopsy of the cervix on 07/28/18 showed adenocarcinoma of the cervix.  PET showed no apparent involvement outside of the cervix and uterus.   She commenced primary chemoradiation on 08/04/18 (though chemotherapy was delayed due to lack of port access).   Interval History:  She completed radiation to the pelvis and cervix with Dr. Gery Pray with radiosensitizing chemotherapy completed on September 15, 2018.  She received a single HDR tandem and ring brachii therapy treatment with anticipation for completion hysterectomy given her excellent response to therapy and stage I bulky disease.  The goal of  this treatment approach was to minimize long-term toxicity of excessive brachii therapy by debulking the primary site.  MRI was performed following completion of therapy on October 20, 2018 and revealed a uterus measuring 7.1 x 6 4 x 4.7 cm with a small mass in the cervix so  showing peripheral contrast-enhancement and a central area of necrosis that is significantly decreased in size.  It currently measures 2.1 x 1.7 cm.  The right and left ovaries were normal.  There is no peritoneal thickening or free fluid or lymphadenopathy appreciated.  She is no longer bleeding.     Current Meds:  Outpatient Encounter Medications as of 10/23/2018  Medication Sig  . Ascorbic Acid (VITAMIN C) 100 MG tablet Take 100 mg by mouth daily.  . ferrous sulfate 325 (65 FE) MG tablet Take 1 tablet (325 mg total) by mouth 2 (two) times daily with a meal.  . gabapentin (NEURONTIN) 100 MG capsule Take 1 capsule (100 mg total) by mouth 3 (three) times daily. PATIENT NEEDS PHYSICAL PRIOR TO ANY FURTHER REFILLS  . lidocaine-prilocaine (EMLA) cream Apply to affected area once  . loperamide (IMODIUM A-D) 2 MG tablet Take 2 mg by mouth 4 (four) times daily as needed.  . ondansetron (ZOFRAN) 8 MG tablet Take 1 tablet (8 mg total) by mouth every 8 (eight) hours as needed.  . prochlorperazine (COMPAZINE) 10 MG tablet Take 1 tablet (10 mg total) by mouth every 6 (six) hours as needed (Nausea or vomiting).  Marland Kitchen scopolamine (TRANSDERM-SCOP) 1 MG/3DAYS Place 1 patch (1.5 mg total) onto the skin every 3 (three) days.  . DULoxetine (CYMBALTA) 60 MG capsule Take 1 capsule (60 mg total) by mouth daily.  . hydrocortisone (ANUSOL-HC) 25 MG suppository Place 1 suppository (25 mg total) rectally 2 (two) times daily. (Patient not taking: Reported on 09/14/2018)  . ibuprofen (ADVIL) 800 MG tablet Take 1 tablet (800 mg total) by mouth every 8 (eight) hours as needed. For AFTER surgery only  . Multiple Vitamin (MULTIVITAMIN WITH MINERALS) TABS tablet Take 1 tablet by mouth daily.  Marland Kitchen senna-docusate (SENOKOT-S) 8.6-50 MG tablet Take 2 tablets by mouth at bedtime. For AFTER surgery, do not take if having diarrhea  . [DISCONTINUED] diphenoxylate-atropine (LOMOTIL) 2.5-0.025 MG tablet Take 1 tablet by mouth 4 (four) times  daily as needed for diarrhea or loose stools.  . [DISCONTINUED] docusate sodium (COLACE) 100 MG capsule Take 1 capsule (100 mg total) by mouth 2 (two) times daily.   No facility-administered encounter medications on file as of 10/23/2018.     Allergy: No Known Allergies  Social Hx:   Social History   Socioeconomic History  . Marital status: Married    Spouse name: Leane Para  . Number of children: 3  . Years of education: 28  . Highest education level: Not on file  Occupational History  . Not on file  Social Needs  . Financial resource strain: Not on file  . Food insecurity    Worry: Not on file    Inability: Not on file  . Transportation needs    Medical: Not on file    Non-medical: Not on file  Tobacco Use  . Smoking status: Former Smoker    Years: 8.00    Types: Cigarettes    Quit date: 02/15/2018    Years since quitting: 0.6  . Smokeless tobacco: Never Used  Substance and Sexual Activity  . Alcohol use: Not Currently  . Drug use: Never  . Sexual activity: Yes  Birth control/protection: Other-see comments    Comment: husband ---- vasectomy ;  abstinence since 06/ 2020  Lifestyle  . Physical activity    Days per week: Not on file    Minutes per session: Not on file  . Stress: Not on file  Relationships  . Social Herbalist on phone: Not on file    Gets together: Not on file    Attends religious service: Not on file    Active member of club or organization: Not on file    Attends meetings of clubs or organizations: Not on file    Relationship status: Not on file  . Intimate partner violence    Fear of current or ex partner: Not on file    Emotionally abused: Not on file    Physically abused: Not on file    Forced sexual activity: Not on file  Other Topics Concern  . Not on file  Social History Narrative   Lives with husband, children   Caffeine ,limited    Past Surgical Hx:  Past Surgical History:  Procedure Laterality Date  . IR IMAGING  GUIDED PORT INSERTION  08/11/2018  . OPERATIVE ULTRASOUND N/A 09/15/2018   Procedure: OPERATIVE ULTRASOUND;  Surgeon: Gery Pray, MD;  Location: [facility];  Service: Urology;  Laterality: N/A;  . TANDEM RING INSERTION N/A 09/15/2018   Procedure: TANDEM RING INSERTION;  Surgeon: Gery Pray, MD;  Location: [facility];  Service: Urology;  Laterality: N/A;  . WISDOM TOOTH EXTRACTION      Past Medical Hx:  Past Medical History:  Diagnosis Date  . Anemia   . Chemotherapy-induced nausea and vomiting    intermittant  . Depression with anxiety   . History of cancer chemotherapy    cervical cancer--- 08-17-2018 to 09-07-2018  . History of external beam radiation therapy    cervical cancer--- 08-13-2018 to 09-08-2018  . Intermittent diarrhea    secondary to radiation therapy  . Malignant neoplasm of overlapping sites of cervix [facility]) oncologist-- dr gorsuch/  dr Sondra Come   dx 07-28-2018--- Stage IB3--  completed chemotherapy 09-07-2018 and IMRT 09-08-2018  . Migraine   . Neuropathic pain   . Pancytopenia, acquired ([place])   . Pelvic mass in female 07/28/2018    Past Gynecological History:  See HPI  No LMP recorded. (Menstrual status: Perimenopausal).  Family Hx:  Family History  Problem Relation Age of Onset  . Heart disease Paternal Uncle   . Heart disease Paternal Grandfather   . Multiple sclerosis Mother   . Heart disease Father   . Multiple sclerosis Sister     Review of Systems:  Constitutional  Feels fatigued  ENT Normal appearing ears and nares bilaterally Skin/Breast  No rash, sores, jaundice, itching, dryness Cardiovascular  No chest pain, shortness of breath, or edema  Pulmonary  No cough or wheeze.  Gastro Intestinal  No nausea, vomitting, or diarrhoea. No bright red blood per rectum, no abdominal pain, change in bowel movement, or constipation.  Genito Urinary  No frequency, urgency, dysuria, + bleeding Musculo Skeletal  No  myalgia, arthralgia, joint swelling or pain  Neurologic  No weakness, numbness, change in gait,  Psychology  No depression, anxiety, insomnia.   Vitals:  Blood pressure 108/63, pulse 74, temperature 98.9 F (37.2 C), temperature source Temporal, resp. rate 18, height 5\' 1"  (1.549 m), weight 120 lb (54.4 kg), SpO2 100 %.  Physical Exam: WD in NAD Neck  Supple NROM, without any  enlargements.  Lymph Node Survey No cervical supraclavicular or inguinal adenopathy Cardiovascular  Pulse normal rate, regularity and rhythm. S1 and S2 normal.  Lungs  Clear to auscultation bilateraly, without wheezes/crackles/rhonchi. Good air movement.  Skin  No rash/lesions/breakdown  Psychiatry  Alert and oriented to person, place, and time  Abdomen  Normoactive bowel sounds, abdomen soft, non-tender and thin without evidence of hernia.  Back No CVA tenderness Genito Urinary  Vulva/vagina: Normal external female genitalia.   No lesions. No discharge  Bladder/urethra:  No lesions or masses, well supported bladder  Vagina: well supported  Cervix: smooth ectocervix with no visible tumor. Cervix soft, no parametrial disease.  Uterus: deferred  Adnexa: deferred Rectal  deferred Extremities  No bilateral cyanosis, clubbing or edema.  Thereasa Solo, MD  10/23/2018, 4:55 PM

## 2018-10-26 ENCOUNTER — Encounter (HOSPITAL_COMMUNITY): Payer: Self-pay

## 2018-10-26 ENCOUNTER — Other Ambulatory Visit: Payer: Self-pay

## 2018-10-26 ENCOUNTER — Other Ambulatory Visit (HOSPITAL_COMMUNITY)
Admission: RE | Admit: 2018-10-26 | Discharge: 2018-10-26 | Disposition: A | Discharge status: Home / Self Care | Payer: Managed Care, Other (non HMO) | Source: Ambulatory Visit | Attending: Gynecologic Oncology | Admitting: Gynecologic Oncology

## 2018-10-26 ENCOUNTER — Encounter (HOSPITAL_COMMUNITY)
Admission: RE | Admit: 2018-10-26 | Discharge: 2018-10-26 | Disposition: A | Discharge status: Home / Self Care | Payer: Managed Care, Other (non HMO) | Source: Ambulatory Visit | Attending: Gynecologic Oncology | Admitting: Gynecologic Oncology

## 2018-10-26 DIAGNOSIS — C539 Malignant neoplasm of cervix uteri, unspecified: Secondary | ICD-10-CM | POA: Insufficient documentation

## 2018-10-26 DIAGNOSIS — Z01812 Encounter for preprocedural laboratory examination: Secondary | ICD-10-CM | POA: Insufficient documentation

## 2018-10-26 DIAGNOSIS — Z20828 Contact with and (suspected) exposure to other viral communicable diseases: Secondary | ICD-10-CM | POA: Insufficient documentation

## 2018-10-26 LAB — URINALYSIS, ROUTINE W REFLEX MICROSCOPIC
Bacteria, UA: NONE SEEN
Bilirubin Urine: NEGATIVE
Glucose, UA: NEGATIVE mg/dL
Ketones, ur: NEGATIVE mg/dL
Nitrite: NEGATIVE
Protein, ur: NEGATIVE mg/dL
Specific Gravity, Urine: 1.002 — ABNORMAL LOW (ref 1.005–1.030)
pH: 7 (ref 5.0–8.0)

## 2018-10-26 LAB — CBC
HCT: 35.7 % — ABNORMAL LOW (ref 36.0–46.0)
Hemoglobin: 11.5 g/dL — ABNORMAL LOW (ref 12.0–15.0)
MCH: 31.8 pg (ref 26.0–34.0)
MCHC: 32.2 g/dL (ref 30.0–36.0)
MCV: 98.6 fL (ref 80.0–100.0)
Platelets: 273 10*3/uL (ref 150–400)
RBC: 3.62 MIL/uL — ABNORMAL LOW (ref 3.87–5.11)
RDW: 16.6 % — ABNORMAL HIGH (ref 11.5–15.5)
WBC: 3.3 10*3/uL — ABNORMAL LOW (ref 4.0–10.5)
nRBC: 0 % (ref 0.0–0.2)

## 2018-10-26 LAB — COMPREHENSIVE METABOLIC PANEL
ALT: 14 U/L (ref 0–44)
AST: 15 U/L (ref 15–41)
Albumin: 4.4 g/dL (ref 3.5–5.0)
Alkaline Phosphatase: 43 U/L (ref 38–126)
Anion gap: 8 (ref 5–15)
BUN: 6 mg/dL (ref 6–20)
CO2: 26 mmol/L (ref 22–32)
Calcium: 9.4 mg/dL (ref 8.9–10.3)
Chloride: 107 mmol/L (ref 98–111)
Creatinine, Ser: 0.65 mg/dL (ref 0.44–1.00)
GFR calc Af Amer: >60 mL/min (ref >60)
GFR calc non Af Amer: >60 mL/min (ref >60)
Glucose, Bld: 76 mg/dL (ref 70–99)
Potassium: 4.1 mmol/L (ref 3.5–5.1)
Sodium: 141 mmol/L (ref 135–145)
Total Bilirubin: 0.5 mg/dL (ref 0.3–1.2)
Total Protein: 7.1 g/dL (ref 6.5–8.1)

## 2018-10-26 NOTE — Patient Instructions (Signed)
DUE TO COVID-19 ONLY ONE VISITOR IS ALLOWED TO COME WITH YOU AND STAY IN THE WAITING ROOM ONLY DURING PRE OP AND PROCEDURE DAY OF SURGERY.   ONCE YOUR COVID TEST IS COMPLETED, PLEASE BEGIN THE QUARANTINE INSTRUCTIONS AS OUTLINED IN YOUR HANDOUT.                Jordan Waters    Your procedure is scheduled on: 10-29-2018   Report to [facility] Main  Entrance   Report to admitting at  1100 AM     Call this number if you have problems the morning of surgery [phone]    Remember: Do not eat food  :After Midnight. CLEAR LIQUIDS FROM MIDNIGHT UNTIL 1000 AM. NOTHING BY MOUTH AFTER 1100 AM DAY OF SURGERY.   BRUSH YOUR TEETH MORNING OF SURGERY AND RINSE YOUR MOUTH OUT, NO CHEWING GUM CANDY OR MINTS.   Eat a light diet the day before surgery ON Wednesday 10-28-2018.  Examples including soups, broths, toast, yogurt, mashed potatoes.  Things to avoid include carbonated beverages (fizzy beverages), raw fruits and raw vegetables, or beans.     CLEAR LIQUID DIET   Foods Allowed                                                                     Foods Excluded  Coffee and tea, regular and decaf                             liquids that you cannot  Plain Jell-O any favor except red or purple                                           see through such as: Fruit ices (not with fruit pulp)                                     milk, soups, orange juice  Iced Popsicles                                    All solid food                                  Cranberry, grape and apple juices Sports drinks like Gatorade Lightly seasoned clear broth or consume(fat free) Sugar, honey syrup  Sample Menu Breakfast                                Lunch                                     Supper Cranberry juice                    Beef broth  Chicken broth Jell-O                                     Grape juice                           Apple juice Coffee or tea                         Jell-O                                      Popsicle                                                Coffee or tea                        Coffee or tea  _____________________________________________________________________  If your bowels are filled with gas, your surgeon will have difficulty visualizing your pelvic organs which increases your surgical risks.   Take these medicines the morning of surgery with A SIP OF WATER:               You may not have any metal on your body including hair pins and              piercings  Do not wear jewelry, make-up, lotions, powders or perfumes, deodorant             Do not wear nail polish on your fingernails.  Do not shave  48 hours prior to surgery.              .   Do not bring valuables to the hospital. [place].  Contacts, dentures or bridgework may not be worn into surgery.  Leave suitcase in the car. After surgery it may be brought to your room.     Patients discharged the day of surgery will not be allowed to drive home. IF YOU ARE HAVING SURGERY AND GOING HOME THE SAME DAY, YOU MUST HAVE AN ADULT TO DRIVE YOU HOME AND BE WITH YOU FOR 24 HOURS. YOU MAY GO HOME BY TAXI OR UBER OR ORTHERWISE, BUT AN ADULT MUST ACCOMPANY YOU HOME AND STAY WITH YOU FOR 24 HOURS.  Name and phone number of your driver:  Special Instructions: N/A              Please read over the following fact sheets you were given: _____________________________________________________________________             [facility] - Preparing for Surgery Before surgery, you can play an important role.  Because skin is not sterile, your skin needs to be as free of germs as possible.  You can reduce the number of germs on your skin by washing with CHG (chlorahexidine gluconate) soap before surgery.  CHG is an antiseptic cleaner which kills germs and bonds with the skin to continue killing germs even after washing. Please DO NOT  use if you have an allergy to  CHG or antibacterial soaps.  If your skin becomes reddened/irritated stop using the CHG and inform your nurse when you arrive at Short Stay. Do not shave (including legs and underarms) for at least 48 hours prior to the first CHG shower.  You may shave your face/neck. Please follow these instructions carefully:  1.  Shower with CHG Soap the night before surgery and the  morning of Surgery.  2.  If you choose to wash your hair, wash your hair first as usual with your  normal  shampoo.  3.  After you shampoo, rinse your hair and body thoroughly to remove the  shampoo.                           4.  Use CHG as you would any other liquid soap.  You can apply chg directly  to the skin and wash                       Gently with a scrungie or clean washcloth.  5.  Apply the CHG Soap to your body ONLY FROM THE NECK DOWN.   Do not use on face/ open                           Wound or open sores. Avoid contact with eyes, ears mouth and genitals (private parts).                       Wash face,  Genitals (private parts) with your normal soap.             6.  Wash thoroughly, paying special attention to the area where your surgery  will be performed.  7.  Thoroughly rinse your body with warm water from the neck down.  8.  DO NOT shower/wash with your normal soap after using and rinsing off  the CHG Soap.                9.  Pat yourself dry with a clean towel.            10.  Wear clean pajamas.            11.  Place clean sheets on your bed the night of your first shower and do not  sleep with pets. Day of Surgery : Do not apply any lotions/deodorants the morning of surgery.  Please wear clean clothes to the hospital/surgery center.  FAILURE TO FOLLOW THESE INSTRUCTIONS MAY RESULT IN THE CANCELLATION OF YOUR SURGERY PATIENT SIGNATURE_________________________________  NURSE  SIGNATURE__________________________________  ________________________________________________________________________   Jordan Waters  An incentive spirometer is a tool that can help keep your lungs clear and active. This tool measures how well you are filling your lungs with each breath. Taking long deep breaths may help reverse or decrease the chance of developing breathing (pulmonary) problems (especially infection) following:  A long period of time when you are unable to move or be active. BEFORE THE PROCEDURE   If the spirometer includes an indicator to show your best effort, your nurse or respiratory therapist will set it to a desired goal.  If possible, sit up straight or lean slightly forward. Try not to slouch.  Hold the incentive spirometer in an upright position. INSTRUCTIONS FOR USE  1. Sit on the edge of your bed if possible, or sit up as far as you can  in bed or on a chair. 2. Hold the incentive spirometer in an upright position. 3. Breathe out normally. 4. Place the mouthpiece in your mouth and seal your lips tightly around it. 5. Breathe in slowly and as deeply as possible, raising the piston or the ball toward the top of the column. 6. Hold your breath for 3-5 seconds or for as long as possible. Allow the piston or ball to fall to the bottom of the column. 7. Remove the mouthpiece from your mouth and breathe out normally. 8. Rest for a few seconds and repeat Steps 1 through 7 at least 10 times every 1-2 hours when you are awake. Take your time and take a few normal breaths between deep breaths. 9. The spirometer may include an indicator to show your best effort. Use the indicator as a goal to work toward during each repetition. 10. After each set of 10 deep breaths, practice coughing to be sure your lungs are clear. If you have an incision (the cut made at the time of surgery), support your incision when coughing by placing a pillow or rolled up towels firmly  against it. Once you are able to get out of bed, walk around indoors and cough well. You may stop using the incentive spirometer when instructed by your caregiver.  RISKS AND COMPLICATIONS  Take your time so you do not get dizzy or light-headed.  If you are in pain, you may need to take or ask for pain medication before doing incentive spirometry. It is harder to take a deep breath if you are having pain. AFTER USE  Rest and breathe slowly and easily.  It can be helpful to keep track of a log of your progress. Your caregiver can provide you with a simple table to help with this. If you are using the spirometer at home, follow these instructions: [place] IF:   You are having difficultly using the spirometer.  You have trouble using the spirometer as often as instructed.  Your pain medication is not giving enough relief while using the spirometer.  You develop fever of 100.5 F (38.1 C) or higher. SEEK IMMEDIATE MEDICAL CARE IF:   You cough up bloody sputum that had not been present before.  You develop fever of 102 F (38.9 C) or greater.  You develop worsening pain at or near the incision site. MAKE SURE YOU:   Understand these instructions.  Will watch your condition.  Will get help right away if you are not doing well or get worse. Document Released: 05/20/2006 Document Revised: 04/01/2011 Document Reviewed: 07/21/2006 Exit[employer] Patient Information 2014 Exit[employer], [place].   ________________________________________________________________________  WHAT IS A BLOOD TRANSFUSION? Blood Transfusion Information  A transfusion is the replacement of blood or some of its parts. Blood is made up of multiple cells which provide different functions.  Red blood cells carry oxygen and are used for blood loss replacement.  White blood cells fight against infection.  Platelets control bleeding.  Plasma helps clot blood.  Other blood products are available for  specialized needs, such as hemophilia or other clotting disorders. BEFORE THE TRANSFUSION  Who gives blood for transfusions?   Healthy volunteers who are fully evaluated to make sure their blood is safe. This is blood bank blood. Transfusion therapy is the safest it has ever been in the practice of medicine. Before blood is taken from a donor, a complete history is taken to make sure that person has no history of diseases nor engages  in risky social behavior (examples are intravenous drug use or sexual activity with multiple partners). The donor's travel history is screened to minimize risk of transmitting infections, such as malaria. The donated blood is tested for signs of infectious diseases, such as HIV and hepatitis. The blood is then tested to be sure it is compatible with you in order to minimize the chance of a transfusion reaction. If you or a relative donates blood, this is often done in anticipation of surgery and is not appropriate for emergency situations. It takes many days to process the donated blood. RISKS AND COMPLICATIONS Although transfusion therapy is very safe and saves many lives, the main dangers of transfusion include:   Getting an infectious disease.  Developing a transfusion reaction. This is an allergic reaction to something in the blood you were given. Every precaution is taken to prevent this. The decision to have a blood transfusion has been considered carefully by your caregiver before blood is given. Blood is not given unless the benefits outweigh the risks. AFTER THE TRANSFUSION  Right after receiving a blood transfusion, you will usually feel much better and more energetic. This is especially true if your red blood cells have gotten low (anemic). The transfusion raises the level of the red blood cells which carry oxygen, and this usually causes an energy increase.  The nurse administering the transfusion will monitor you carefully for complications. HOME CARE  INSTRUCTIONS  No special instructions are needed after a transfusion. You may find your energy is better. Speak with your caregiver about any limitations on activity for underlying diseases you may have. SEEK MEDICAL CARE IF:   Your condition is not improving after your transfusion.  You develop redness or irritation at the intravenous (IV) site. SEEK IMMEDIATE MEDICAL CARE IF:  Any of the following symptoms occur over the next 12 hours:  Shaking chills.  You have a temperature by mouth above 102 F (38.9 C), not controlled by medicine.  Chest, back, or muscle pain.  People around you feel you are not acting correctly or are confused.  Shortness of breath or difficulty breathing.  Dizziness and fainting.  You get a rash or develop hives.  You have a decrease in urine output.  Your urine turns a dark color or changes to pink, red, or brown. Any of the following symptoms occur over the next 10 days:  You have a temperature by mouth above 102 F (38.9 C), not controlled by medicine.  Shortness of breath.  Weakness after normal activity.  The white part of the eye turns yellow (jaundice).  You have a decrease in the amount of urine or are urinating less often.  Your urine turns a dark color or changes to pink, red, or brown. Document Released: 01/05/2000 Document Revised: 04/01/2011 Document Reviewed: 08/24/2007 [facility] Patient Information 2014 [place], [place].  _______________________________________________________________________

## 2018-10-26 NOTE — Patient Instructions (Addendum)
DUE TO COVID-19 ONLY ONE VISITOR IS ALLOWED TO COME WITH YOU AND STAY IN THE WAITING ROOM ONLY DURING PRE OP AND PROCEDURE DAY OF SURGERY. THE 1 VISITOR MAY VISIT WITH YOU AFTER SURGERY IN YOUR PRIVATE ROOM DURING VISITING HOURS ONLY!  YOU NEED TO HAVE A COVID 19 TEST ON_______ @_______ , THIS TEST MUST BE DONE BEFORE SURGERY, COME  [place], [place] , [address].  ([place]) ONCE YOUR COVID TEST IS COMPLETED, PLEASE BEGIN THE QUARANTINE INSTRUCTIONS AS OUTLINED IN YOUR HANDOUT.                Jordan Waters    Your procedure is scheduled on: 10/29/18   Report to [facility] Main  Entrance   Report to admitting at 11:00  AM     Call this number if you have problems the morning of surgery [phone]     . BRUSH YOUR TEETH MORNING OF SURGERY AND RINSE YOUR MOUTH OUT, NO CHEWING GUM CANDY OR MINTS.   Full Liquid Diet      Eat light the day before surgery  Strained creamy soups Tea, Coffee- with cream or mild and sugar or honey  Juices- cranberry , grape and apple  Jello  Milkshakes  Pudding , custards  Popsicles  Water Plain ice cream f, frozen yogurt, sherbet, plain yogurt  Fruit ices and popsicles with no fruit pulp  Sugar, honey and syrups Clear broths  Boost, Ensure, Resource and other liquid supplements NO CARBONATED BEVERAGES   NO SOLID FOOD AFTER MIDNIGHT  CLEAR LIQUIDS UNTIL 10:00 AM DAY OF SURGERY   CLEAR LIQUID DIET   Foods Allowed                                                                     Foods Excluded  Coffee and tea, regular and decaf                             liquids that you cannot  Plain Jell-O any favor except red or purple                                           see through such as: Fruit ices (not with fruit pulp)                                     milk, soups, orange juice  Iced Popsicles                                    All solid food Carbonated beverages, regular and diet                                     Cranberry, grape and apple juices Sports drinks like Gatorade Lightly seasoned clear broth or consume(fat free) Sugar, honey syrup  Take these medicines the morning of surgery with A SIP OF WATER:                                  You may not have any metal on your body including hair pins and              piercings             Do not wear jewelry, make-up, lotions, powders or perfumes, deodorant             Do not wear nail polish on your fingernails.  Do not shave  48 hours prior to surgery.             Do not bring valuables to the hospital. [place].  Contacts, dentures or bridgework may not be worn into surgery.       Patients discharged the day of surgery will not be allowed to drive home.      IF YOU ARE HAVING SURGERY AND GOING HOME THE SAME DAY, YOU MUST HAVE AN ADULT TO DRIVE YOU HOME AND BE WITH YOU FOR 24 HOURS.  YOU MAY GO HOME BY TAXI OR UBER OR ORTHERWISE, BUT AN ADULT MUST ACCOMPANY YOU HOME AND STAY WITH YOU FOR 24 HOURS.  Name and phone number of your driver:  Special Instructions: N/A              Please read over the following fact sheets you were given: _____________________________________________________________________             [facility] - Preparing for Surgery Before surgery, you can play an important role.  Because skin is not sterile, your skin needs to be as free of germs as possible.  You can reduce the number of germs on your skin by washing with CHG (chlorahexidine gluconate) soap before surgery.  CHG is an antiseptic cleaner which kills germs and bonds with the skin to continue killing germs even after washing. Please DO NOT use if you have an allergy to CHG or antibacterial soaps.  If your skin becomes reddened/irritated stop using the CHG and inform your nurse when you arrive at Short Stay. Do not shave (including legs and underarms) for at least 48 hours prior to the  first CHG shower.  You may shave your face/neck. Please follow these instructions carefully:  1.  Shower with CHG Soap the night before surgery and the  morning of Surgery.  2.  If you choose to wash your hair, wash your hair first as usual with your  normal  shampoo.  3.  After you shampoo, rinse your hair and body thoroughly to remove the  shampoo.                           4.  Use CHG as you would any other liquid soap.  You can apply chg directly  to the skin and wash                       Gently with a scrungie or clean washcloth.  5.  Apply the CHG Soap to your body ONLY FROM THE NECK DOWN.   Do not use on face/ open  Wound or open sores. Avoid contact with eyes, ears mouth and genitals (private parts).                       Wash face,  Genitals (private parts) with your normal soap.             6.  Wash thoroughly, paying special attention to the area where your surgery  will be performed.  7.  Thoroughly rinse your body with warm water from the neck down.  8.  DO NOT shower/wash with your normal soap after using and rinsing off  the CHG Soap.                9.  Pat yourself dry with a clean towel.            10.  Wear clean pajamas.            11.  Place clean sheets on your bed the night of your first shower and do not  sleep with pets. Day of Surgery : Do not apply any lotions/deodorants the morning of surgery.  Please wear clean clothes to the hospital/surgery center.  FAILURE TO FOLLOW THESE INSTRUCTIONS MAY RESULT IN THE CANCELLATION OF YOUR SURGERY PATIENT SIGNATURE_________________________________  NURSE SIGNATURE__________________________________  ________________________________________________________________________

## 2018-10-26 NOTE — Progress Notes (Signed)
PCP - Everardo All NP Cardiologist - no  Chest x-ray - no EKG - no Stress Test - no ECHO - no Cardiac Cath -no  Sleep Study - no CPAP -   Fasting Blood Sugar - NA Checks Blood Sugar _____ times a day  Blood Thinner Instructions:NA Aspirin Instructions: Last Dose:  Anesthesia review:   Patient denies shortness of breath, fever, cough and chest pain at PAT appointment yes  Patient verbalized understanding of instructions that were given to them at the PAT appointment. Patient was also instructed that they will need to review over the PAT instructions again at home before surgery. yes

## 2018-10-27 ENCOUNTER — Inpatient Hospital Stay (HOSPITAL_COMMUNITY)
Admission: RE | Admit: 2018-10-27 | Discharge: 2018-10-27 | Disposition: A | Discharge status: Home / Self Care | Payer: Managed Care, Other (non HMO) | Source: Ambulatory Visit

## 2018-10-27 LAB — NOVEL CORONAVIRUS, NAA (HOSP ORDER, SEND-OUT TO REF LAB; TAT 18-24 HRS): SARS-CoV-2, NAA: NOT DETECTED

## 2018-10-29 ENCOUNTER — Ambulatory Visit (HOSPITAL_COMMUNITY)
Admission: RE | Admit: 2018-10-29 | Discharge: 2018-10-29 | Disposition: A | Discharge status: Home / Self Care | Payer: Managed Care, Other (non HMO) | Attending: Gynecologic Oncology | Admitting: Gynecologic Oncology

## 2018-10-29 ENCOUNTER — Encounter (HOSPITAL_COMMUNITY)
Admission: RE | Disposition: A | Discharge status: Home / Self Care | Payer: Self-pay | Source: Home / Self Care | Attending: Gynecologic Oncology

## 2018-10-29 ENCOUNTER — Ambulatory Visit (HOSPITAL_COMMUNITY): Payer: Managed Care, Other (non HMO) | Admitting: Certified Registered Nurse Anesthetist

## 2018-10-29 ENCOUNTER — Encounter (HOSPITAL_COMMUNITY): Payer: Self-pay | Admitting: Gynecologic Oncology

## 2018-10-29 DIAGNOSIS — Z79899 Other long term (current) drug therapy: Secondary | ICD-10-CM | POA: Insufficient documentation

## 2018-10-29 DIAGNOSIS — C538 Malignant neoplasm of overlapping sites of cervix uteri: Secondary | ICD-10-CM | POA: Diagnosis not present

## 2018-10-29 DIAGNOSIS — Z9221 Personal history of antineoplastic chemotherapy: Secondary | ICD-10-CM | POA: Diagnosis not present

## 2018-10-29 DIAGNOSIS — D61818 Other pancytopenia: Secondary | ICD-10-CM | POA: Insufficient documentation

## 2018-10-29 DIAGNOSIS — Z820 Family history of epilepsy and other diseases of the nervous system: Secondary | ICD-10-CM | POA: Diagnosis not present

## 2018-10-29 DIAGNOSIS — F329 Major depressive disorder, single episode, unspecified: Secondary | ICD-10-CM | POA: Insufficient documentation

## 2018-10-29 DIAGNOSIS — G43909 Migraine, unspecified, not intractable, without status migrainosus: Secondary | ICD-10-CM | POA: Insufficient documentation

## 2018-10-29 DIAGNOSIS — Z923 Personal history of irradiation: Secondary | ICD-10-CM | POA: Diagnosis not present

## 2018-10-29 DIAGNOSIS — D62 Acute posthemorrhagic anemia: Secondary | ICD-10-CM | POA: Diagnosis not present

## 2018-10-29 DIAGNOSIS — N939 Abnormal uterine and vaginal bleeding, unspecified: Secondary | ICD-10-CM | POA: Insufficient documentation

## 2018-10-29 DIAGNOSIS — Z791 Long term (current) use of non-steroidal anti-inflammatories (NSAID): Secondary | ICD-10-CM | POA: Diagnosis not present

## 2018-10-29 DIAGNOSIS — F419 Anxiety disorder, unspecified: Secondary | ICD-10-CM | POA: Diagnosis not present

## 2018-10-29 DIAGNOSIS — Z87891 Personal history of nicotine dependence: Secondary | ICD-10-CM | POA: Diagnosis not present

## 2018-10-29 DIAGNOSIS — D259 Leiomyoma of uterus, unspecified: Secondary | ICD-10-CM | POA: Insufficient documentation

## 2018-10-29 DIAGNOSIS — Z8249 Family history of ischemic heart disease and other diseases of the circulatory system: Secondary | ICD-10-CM | POA: Diagnosis not present

## 2018-10-29 DIAGNOSIS — C539 Malignant neoplasm of cervix uteri, unspecified: Secondary | ICD-10-CM | POA: Diagnosis present

## 2018-10-29 HISTORY — PX: ROBOTIC ASSISTED TOTAL HYSTERECTOMY WITH BILATERAL SALPINGO OOPHERECTOMY: SHX6086

## 2018-10-29 LAB — TYPE AND SCREEN
ABO/RH(D): B POS
Antibody Screen: NEGATIVE

## 2018-10-29 LAB — ABO/RH: ABO/RH(D): B POS

## 2018-10-29 LAB — PREGNANCY, URINE: Preg Test, Ur: NEGATIVE

## 2018-10-29 SURGERY — HYSTERECTOMY, TOTAL, ROBOT-ASSISTED, LAPAROSCOPIC, WITH BILATERAL SALPINGO-OOPHORECTOMY
Anesthesia: General | Site: Abdomen | Laterality: Bilateral

## 2018-10-29 MED ORDER — MIDAZOLAM HCL 5 MG/5ML IJ SOLN
INTRAMUSCULAR | Status: DC | PRN
  Administered 2018-10-29: 2 mg via INTRAVENOUS

## 2018-10-29 MED ORDER — ROCURONIUM BROMIDE 10 MG/ML (PF) SYRINGE
PREFILLED_SYRINGE | INTRAVENOUS | Status: AC
  Filled 2018-10-29: qty 10

## 2018-10-29 MED ORDER — ACETAMINOPHEN 500 MG PO TABS
1000.0000 mg | ORAL_TABLET | ORAL | Status: AC
  Administered 2018-10-29: 12:00:00 1000 mg via ORAL
  Filled 2018-10-29: qty 2

## 2018-10-29 MED ORDER — ROCURONIUM BROMIDE 50 MG/5ML IV SOSY
PREFILLED_SYRINGE | INTRAVENOUS | Status: DC | PRN
  Administered 2018-10-29: 50 mg via INTRAVENOUS
  Administered 2018-10-29: 20 mg via INTRAVENOUS

## 2018-10-29 MED ORDER — GABAPENTIN 300 MG PO CAPS
300.0000 mg | ORAL_CAPSULE | ORAL | Status: AC
  Administered 2018-10-29: 300 mg via ORAL
  Filled 2018-10-29: qty 1

## 2018-10-29 MED ORDER — CELECOXIB 200 MG PO CAPS
400.0000 mg | ORAL_CAPSULE | ORAL | Status: AC
  Administered 2018-10-29: 400 mg via ORAL
  Filled 2018-10-29: qty 2

## 2018-10-29 MED ORDER — DEXAMETHASONE SODIUM PHOSPHATE 10 MG/ML IJ SOLN
INTRAMUSCULAR | Status: AC
  Filled 2018-10-29: qty 1

## 2018-10-29 MED ORDER — KETOROLAC TROMETHAMINE 30 MG/ML IJ SOLN
30.0000 mg | Freq: Once | INTRAMUSCULAR | Status: AC
  Administered 2018-10-29: 30 mg via INTRAVENOUS

## 2018-10-29 MED ORDER — OXYCODONE HCL 5 MG PO TABS: 5.0000 mg | ORAL_TABLET | Freq: Once | ORAL | Status: DC | PRN

## 2018-10-29 MED ORDER — STERILE WATER FOR IRRIGATION IR SOLN
Status: DC | PRN
  Administered 2018-10-29: 1000 mL

## 2018-10-29 MED ORDER — DEXAMETHASONE SODIUM PHOSPHATE 10 MG/ML IJ SOLN
INTRAMUSCULAR | Status: DC | PRN
  Administered 2018-10-29: 5 mg via INTRAVENOUS

## 2018-10-29 MED ORDER — FENTANYL CITRATE (PF) 250 MCG/5ML IJ SOLN
INTRAMUSCULAR | Status: AC
  Filled 2018-10-29: qty 5

## 2018-10-29 MED ORDER — ONDANSETRON HCL 4 MG/2ML IJ SOLN
INTRAMUSCULAR | Status: DC | PRN
  Administered 2018-10-29: 4 mg via INTRAVENOUS

## 2018-10-29 MED ORDER — FENTANYL CITRATE (PF) 100 MCG/2ML IJ SOLN
INTRAMUSCULAR | Status: DC | PRN
  Administered 2018-10-29 (×5): 50 ug via INTRAVENOUS

## 2018-10-29 MED ORDER — STERILE WATER FOR INJECTION IJ SOLN
INTRAMUSCULAR | Status: AC
  Filled 2018-10-29: qty 10

## 2018-10-29 MED ORDER — LIDOCAINE 2% (20 MG/ML) 5 ML SYRINGE
INTRAMUSCULAR | Status: DC | PRN
  Administered 2018-10-29: 1.5 mg/kg/h via INTRAVENOUS

## 2018-10-29 MED ORDER — CEFAZOLIN SODIUM-DEXTROSE 2-4 GM/100ML-% IV SOLN
2.0000 g | INTRAVENOUS | Status: AC
  Administered 2018-10-29: 13:00:00 2 g via INTRAVENOUS
  Filled 2018-10-29: qty 100

## 2018-10-29 MED ORDER — PROPOFOL 10 MG/ML IV BOLUS
INTRAVENOUS | Status: AC
  Filled 2018-10-29: qty 20

## 2018-10-29 MED ORDER — DEXAMETHASONE SODIUM PHOSPHATE 4 MG/ML IJ SOLN: 4.0000 mg | INTRAMUSCULAR | Status: DC

## 2018-10-29 MED ORDER — ONDANSETRON HCL 4 MG/2ML IJ SOLN: 4.0000 mg | Freq: Four times a day (QID) | INTRAMUSCULAR | Status: DC | PRN

## 2018-10-29 MED ORDER — PROPOFOL 10 MG/ML IV BOLUS
INTRAVENOUS | Status: DC | PRN
  Administered 2018-10-29: 130 mg via INTRAVENOUS

## 2018-10-29 MED ORDER — ONDANSETRON HCL 4 MG/2ML IJ SOLN
INTRAMUSCULAR | Status: AC
  Filled 2018-10-29: qty 2

## 2018-10-29 MED ORDER — MIDAZOLAM HCL 2 MG/2ML IJ SOLN
INTRAMUSCULAR | Status: AC
  Filled 2018-10-29: qty 2

## 2018-10-29 MED ORDER — BUPIVACAINE HCL 0.25 % IJ SOLN
INTRAMUSCULAR | Status: DC | PRN
  Administered 2018-10-29: 18 mL

## 2018-10-29 MED ORDER — SCOPOLAMINE 1 MG/3DAYS TD PT72
1.0000 | MEDICATED_PATCH | TRANSDERMAL | Status: DC
  Administered 2018-10-29: 12:00:00 1.5 mg via TRANSDERMAL
  Filled 2018-10-29: qty 1

## 2018-10-29 MED ORDER — LIDOCAINE 2% (20 MG/ML) 5 ML SYRINGE
INTRAMUSCULAR | Status: AC
  Filled 2018-10-29: qty 5

## 2018-10-29 MED ORDER — FENTANYL CITRATE (PF) 100 MCG/2ML IJ SOLN
INTRAMUSCULAR | Status: AC
  Filled 2018-10-29: qty 2

## 2018-10-29 MED ORDER — KETOROLAC TROMETHAMINE 30 MG/ML IJ SOLN
INTRAMUSCULAR | Status: AC
  Filled 2018-10-29: qty 1

## 2018-10-29 MED ORDER — LIDOCAINE 2% (20 MG/ML) 5 ML SYRINGE
INTRAMUSCULAR | Status: DC | PRN
  Administered 2018-10-29: 60 mg via INTRAVENOUS

## 2018-10-29 MED ORDER — FENTANYL CITRATE (PF) 100 MCG/2ML IJ SOLN
25.0000 ug | INTRAMUSCULAR | Status: DC | PRN
  Administered 2018-10-29 (×2): 50 ug via INTRAVENOUS

## 2018-10-29 MED ORDER — SUGAMMADEX SODIUM 200 MG/2ML IV SOLN
INTRAVENOUS | Status: DC | PRN
  Administered 2018-10-29: 200 mg via INTRAVENOUS

## 2018-10-29 MED ORDER — LACTATED RINGERS IV SOLN
INTRAVENOUS | Status: DC
  Administered 2018-10-29: 12:00:00 via INTRAVENOUS

## 2018-10-29 MED ORDER — LACTATED RINGERS IR SOLN
Status: DC | PRN
  Administered 2018-10-29: 1000 mL

## 2018-10-29 MED ORDER — OXYCODONE HCL 5 MG/5ML PO SOLN: 5.0000 mg | Freq: Once | ORAL | Status: DC | PRN

## 2018-10-29 MED ORDER — ENOXAPARIN SODIUM 40 MG/0.4ML ~~LOC~~ SOLN
40.0000 mg | SUBCUTANEOUS | Status: AC
  Administered 2018-10-29: 40 mg via SUBCUTANEOUS
  Filled 2018-10-29: qty 0.4

## 2018-10-29 MED ORDER — BUPIVACAINE HCL (PF) 0.25 % IJ SOLN
INTRAMUSCULAR | Status: AC
  Filled 2018-10-29: qty 30

## 2018-10-29 SURGICAL SUPPLY — 61 items
APPLICATOR SURGIFLO ENDO (HEMOSTASIS) IMPLANT
BAG LAPAROSCOPIC 12 15 PORT 16 (BASKET) IMPLANT
BAG RETRIEVAL 12/15 (BASKET)
BLADE SURG SZ10 CARB STEEL (BLADE) IMPLANT
COVER BACK TABLE 60X90IN (DRAPES) ×2 IMPLANT
COVER TIP SHEARS 8 DVNC (MISCELLANEOUS) ×1 IMPLANT
COVER TIP SHEARS 8MM DA VINCI (MISCELLANEOUS) ×1
COVER WAND RF STERILE (DRAPES) IMPLANT
DECANTER SPIKE VIAL GLASS SM (MISCELLANEOUS) ×1 IMPLANT
DERMABOND ADVANCED (GAUZE/BANDAGES/DRESSINGS) ×1
DERMABOND ADVANCED .7 DNX12 (GAUZE/BANDAGES/DRESSINGS) ×1 IMPLANT
DRAPE ARM DVNC X/XI (DISPOSABLE) ×4 IMPLANT
DRAPE COLUMN DVNC XI (DISPOSABLE) ×1 IMPLANT
DRAPE DA VINCI XI ARM (DISPOSABLE) ×4
DRAPE DA VINCI XI COLUMN (DISPOSABLE) ×1
DRAPE SHEET LG 3/4 BI-LAMINATE (DRAPES) ×2 IMPLANT
DRAPE SURG IRRIG POUCH 19X23 (DRAPES) ×2 IMPLANT
DRSG OPSITE POSTOP 4X6 (GAUZE/BANDAGES/DRESSINGS) IMPLANT
DRSG OPSITE POSTOP 4X8 (GAUZE/BANDAGES/DRESSINGS) IMPLANT
ELECT REM PT RETURN 15FT ADLT (MISCELLANEOUS) ×2 IMPLANT
GLOVE BIO SURGEON STRL SZ 6 (GLOVE) ×8 IMPLANT
GLOVE BIO SURGEON STRL SZ 6.5 (GLOVE) IMPLANT
GOWN STRL REUS W/ TWL LRG LVL3 (GOWN DISPOSABLE) ×4 IMPLANT
GOWN STRL REUS W/TWL LRG LVL3 (GOWN DISPOSABLE) ×4
HOLDER FOLEY CATH W/STRAP (MISCELLANEOUS) ×2 IMPLANT
IRRIG SUCT STRYKERFLOW 2 WTIP (MISCELLANEOUS) ×2
IRRIGATION SUCT STRKRFLW 2 WTP (MISCELLANEOUS) ×1 IMPLANT
KIT PROCEDURE DA VINCI SI (MISCELLANEOUS)
KIT PROCEDURE DVNC SI (MISCELLANEOUS) IMPLANT
KIT TURNOVER KIT A (KITS) IMPLANT
MANIPULATOR UTERINE 4.5 ZUMI (MISCELLANEOUS) ×2 IMPLANT
NDL SPNL 18GX3.5 QUINCKE PK (NEEDLE) IMPLANT
NEEDLE HYPO 22GX1.5 SAFETY (NEEDLE) ×1 IMPLANT
NEEDLE SPNL 18GX3.5 QUINCKE PK (NEEDLE) IMPLANT
OBTURATOR OPTICAL STANDARD 8MM (TROCAR) ×1
OBTURATOR OPTICAL STND 8 DVNC (TROCAR) ×1
OBTURATOR OPTICALSTD 8 DVNC (TROCAR) ×1 IMPLANT
PACK ROBOT GYN CUSTOM WL (TRAY / TRAY PROCEDURE) ×2 IMPLANT
PAD POSITIONING PINK XL (MISCELLANEOUS) ×2 IMPLANT
PORT ACCESS TROCAR AIRSEAL 12 (TROCAR) ×1 IMPLANT
PORT ACCESS TROCAR AIRSEAL 5M (TROCAR) ×1
POUCH SPECIMEN RETRIEVAL 10MM (ENDOMECHANICALS) IMPLANT
SEAL CANN UNIV 5-8 DVNC XI (MISCELLANEOUS) ×3 IMPLANT
SEAL XI 5MM-8MM UNIVERSAL (MISCELLANEOUS) ×3
SET TRI-LUMEN FLTR TB AIRSEAL (TUBING) ×2 IMPLANT
SPONGE LAP 18X18 X RAY DECT (DISPOSABLE) IMPLANT
SURGIFLO W/THROMBIN 8M KIT (HEMOSTASIS) IMPLANT
SUT MNCRL AB 4-0 PS2 18 (SUTURE) ×1 IMPLANT
SUT PDS AB 1 TP1 96 (SUTURE) IMPLANT
SUT VIC AB 0 CT1 27 (SUTURE) ×1
SUT VIC AB 0 CT1 27XBRD ANTBC (SUTURE) IMPLANT
SUT VIC AB 2-0 CT1 27 (SUTURE)
SUT VIC AB 2-0 CT1 TAPERPNT 27 (SUTURE) IMPLANT
SUT VIC AB 4-0 PS2 18 (SUTURE) ×4 IMPLANT
SYR 10ML LL (SYRINGE) IMPLANT
TOWEL OR NON WOVEN STRL DISP B (DISPOSABLE) ×2 IMPLANT
TRAP SPECIMEN MUCOUS 40CC (MISCELLANEOUS) IMPLANT
TRAY FOLEY MTR SLVR 16FR STAT (SET/KITS/TRAYS/PACK) ×2 IMPLANT
UNDERPAD 30X36 HEAVY ABSORB (UNDERPADS AND DIAPERS) ×2 IMPLANT
WATER STERILE IRR 1000ML POUR (IV SOLUTION) ×2 IMPLANT
YANKAUER SUCT BULB TIP 10FT TU (MISCELLANEOUS) IMPLANT

## 2018-10-29 NOTE — Interval H&P Note (Signed)
History and Physical Interval Note:  10/29/2018 12:14 PM  Jordan Waters  has presented today for surgery, with the diagnosis of CERVICAL CANCER.  The various methods of treatment have been discussed with the patient and family. After consideration of risks, benefits and other options for treatment, the patient has consented to  Procedure(s): XI ROBOTIC ASSISTED TOTAL HYSTERECTOMY WITH BILATERAL SALPINGO OOPHORECTOMY (Bilateral) as a surgical intervention.  The patient's history has been reviewed, patient examined, no change in status, stable for surgery.  I have reviewed the patient's chart and labs.  Questions were answered to the patient's satisfaction.     Thereasa Solo

## 2018-10-29 NOTE — Op Note (Signed)
OPERATIVE NOTE 10/29/18  Surgeon: Jordan Waters   Assistants: Dr Lahoma Crocker (an MD assistant was necessary for tissue manipulation, management of robotic instrumentation, retraction and positioning due to the complexity of the case and hospital policies).   Anesthesia: General endotracheal anesthesia  ASA Class: 2   Pre-operative Diagnosis: cervical cancer, stage IB3  Post-operative Diagnosis: same, s/p chemoradiation  Operation: Robotic-assisted laparoscopic total hysterectomy with bilateral salpingoophorectomy   Surgeon: Jordan Waters  Assistant Surgeon: Lahoma Crocker MD  Anesthesia: GET  Urine Output: 200cc  Operative Findings:  : 8cm grossly normal appearing uterus, normal tubes and ovaries, normal appearing cervix with no gross residual disease.   Estimated Blood Loss:  <10cc      Total IV Fluids: 700 ml         Specimens: uterus with cervix and bilateral tubes and ovaries         Complications:  None; patient tolerated the procedure well.         Disposition: PACU - hemodynamically stable.  Procedure Details  The patient was seen in the Holding Room. The risks, benefits, complications, treatment options, and expected outcomes were discussed with the patient.  The patient concurred with the proposed plan, giving informed consent.  The site of surgery properly noted/marked. The patient was identified as Jordan Waters and the procedure verified as a Robotic-assisted hysterectomy with bilateral salpingo oophorectomy. A Time Out was held and the above information confirmed.  After induction of anesthesia, the patient was draped and prepped in the usual sterile manner. Pt was placed in supine position after anesthesia and draped and prepped in the usual sterile manner. The abdominal drape was placed after the CholoraPrep had been allowed to dry for 3 minutes.  Her arms were tucked to her side with all appropriate precautions.  The shoulders were  stabilized with padded shoulder blocks applied to the acromium processes.  The patient was placed in the semi-lithotomy position in [place].  The perineum was prepped with Betadine. The patient was then prepped. Foley catheter was placed.  A sterile speculum was placed in the vagina.  The cervix was grasped with a single-tooth tenaculum and dilated with Kennon Rounds dilators.  The ZUMI uterine manipulator with a medium colpotomizer ring was placed without difficulty.  A pneum occluder balloon was placed over the manipulator.  OG tube placement was confirmed and to suction.   Next, a 5 mm skin incision was made 1 cm below the subcostal margin in the midclavicular line.  The 5 mm Optiview port and scope was used for direct entry.  Opening pressure was under 10 mm CO2.  The abdomen was insufflated and the findings were noted as above.   At this point and all points during the procedure, the patient's intra-abdominal pressure did not exceed 15 mmHg. Next, a 10 mm skin incision was made in the umbilicus and a right and left port was placed about 10 cm lateral to the robot port on the right and left side.  All ports were placed under direct visualization.  The patient was placed in steep Trendelenburg.  Bowel was folded away into the upper abdomen.  The robot was docked in the normal manner.  The hysterectomy was started after the round ligament on the right side was incised and the retroperitoneum was entered and the pararectal space was developed.  The ureter was noted to be on the medial leaf of the broad ligament.  The peritoneum above the ureter was incised and  stretched and the infundibulopelvic ligament was skeletonized, cauterized and cut.  The posterior peritoneum was taken down to the level of the KOH ring.  The anterior peritoneum was also taken down.  The bladder flap was created to the level of the KOH ring.  The uterine artery on the right side was skeletonized, cauterized and cut in the normal manner.   A similar procedure was performed on the left.  The colpotomy was made and the uterus, cervix, bilateral ovaries and tubes were amputated and delivered through the vagina.  Pedicles were inspected and excellent hemostasis was achieved.    The colpotomy at the vaginal cuff was closed with Vicryl on a CT1 needle in a running manner.  Irrigation was used and excellent hemostasis was achieved.  At this point in the procedure was completed.  Robotic instruments were removed under direct visulaization.  The robot was undocked. The 10 mm ports were closed with Vicryl on a UR-5 needle and the fascia was closed with 0 Vicryl on a UR-5 needle.  The skin was closed with 4-0 Vicryl in a subcuticular manner.  Dermabond was applied.  Sponge, lap and needle counts correct x 2.  The patient was taken to the recovery room in stable condition.  The vagina was swabbed with  minimal bleeding noted.   All instrument and needle counts were correct x  3.   The patient was transferred to the recovery room in a stable condition.  Jordan Eva, MD

## 2018-10-29 NOTE — Transfer of Care (Signed)
Immediate Anesthesia Transfer of Care Note  Patient: Jordan Waters  Procedure(s) Performed: XI ROBOTIC ASSISTED TOTAL HYSTERECTOMY WITH BILATERAL SALPINGO OOPHORECTOMY (Bilateral Abdomen)  Patient Location: PACU  Anesthesia Type:General  Level of Consciousness: awake, alert , oriented and patient cooperative  Airway & Oxygen Therapy: Patient Spontanous Breathing and Patient connected to face mask oxygen  Post-op Assessment: Report given to RN and Post -op Vital signs reviewed and stable  Post vital signs: Reviewed and stable  Last Vitals:  Vitals Value Taken Time  BP    Temp    Pulse 68 10/29/18 1426  Resp 16 10/29/18 1426  SpO2 100 % 10/29/18 1426  Vitals shown include unvalidated device data.  Last Pain:  Vitals:   10/29/18 1142  TempSrc:   PainSc: 0-No pain         Complications: No apparent anesthesia complications

## 2018-10-29 NOTE — Anesthesia Preprocedure Evaluation (Signed)
Anesthesia Evaluation  Patient identified by MRN, date of birth, ID band Patient awake    Reviewed: Allergy & Precautions, H&P , NPO status , Patient's Chart, lab work & pertinent test results  Airway Mallampati: II   Neck ROM: full    Dental   Pulmonary former smoker,    breath sounds clear to auscultation       Cardiovascular negative cardio ROS   Rhythm:regular Rate:Normal     Neuro/Psych  Headaches, PSYCHIATRIC DISORDERS Anxiety Depression    GI/Hepatic   Endo/Other    Renal/GU      Musculoskeletal   Abdominal   Peds  Hematology  (+) Blood dyscrasia, anemia ,   Anesthesia Other Findings   Reproductive/Obstetrics Cervical CA                             Anesthesia Physical Anesthesia Plan  ASA: II  Anesthesia Plan: General   Post-op Pain Management:    Induction: Intravenous  PONV Risk Score and Plan: 3 and Ondansetron, Dexamethasone, Midazolam and Treatment may vary due to age or medical condition  Airway Management Planned: Oral ETT  Additional Equipment:   Intra-op Plan:   Post-operative Plan: Extubation in OR  Informed Consent: I have reviewed the patients History and Physical, chart, labs and discussed the procedure including the risks, benefits and alternatives for the proposed anesthesia with the patient or authorized representative who has indicated his/her understanding and acceptance.       Plan Discussed with: CRNA, Anesthesiologist and Surgeon  Anesthesia Plan Comments:         Anesthesia Quick Evaluation

## 2018-10-29 NOTE — Discharge Instructions (Signed)
10/29/2018  Return to work: 4 weeks  Activity: 1. Be up and out of the bed during the day.  Take a nap if needed.  You may walk up steps but be careful and use the hand rail.  Stair climbing will tire you more than you think, you may need to stop part way and rest.   2. No lifting or straining for 4 weeks.  3. No driving for 1 weeks.  Do Not drive if you are taking narcotic pain medicine.  4. Shower daily.  Use soap and water on your incision and pat dry; don't rub.   5. No sexual activity and nothing in the vagina for 8 weeks.  Medications:  - Take ibuprofen and tylenol first line for pain control. Take these regularly (every 6 hours) to decrease the build up of pain.  - If necessary, for severe pain not relieved by ibuprofen, take percocet.  - While taking percocet you should take sennakot every night to reduce the likelihood of constipation. If this causes diarrhea, stop its use.  Diet: 1. Low sodium Heart Healthy Diet is recommended.  2. It is safe to use a laxative if you have difficulty moving your bowels.   Wound Care: 1. Keep clean and dry.  Shower daily.  Reasons to call the Doctor:   Fever - Oral temperature greater than 100.4 degrees Fahrenheit  Foul-smelling vaginal discharge  Difficulty urinating  Nausea and vomiting  Increased pain at the site of the incision that is unrelieved with pain medicine.  Difficulty breathing with or without chest pain  New calf pain especially if only on one side  Sudden, continuing increased vaginal bleeding with or without clots.   Follow-up: 1. See Everitt Amber in 3 weeks.  Contacts: For questions or concerns you should contact:  Dr. Everitt Amber at [phone] After hours and on week-ends call [phone] and ask to speak to the physician on call for Gynecologic Oncology

## 2018-10-29 NOTE — Anesthesia Procedure Notes (Signed)

## 2018-10-30 ENCOUNTER — Telehealth: Payer: Self-pay

## 2018-10-30 ENCOUNTER — Encounter (HOSPITAL_COMMUNITY): Payer: Self-pay | Admitting: Gynecologic Oncology

## 2018-10-30 NOTE — Telephone Encounter (Signed)
Pt doing well this morning. Eating, drinking, and urinating well. She is using tylenol alt. With Ibuprofen 800 mg q 8 hrs. Pt abdomen is sore.   Suggested she use a heating pad to her back along with tiger balm to help with pain. Pt does not use narcotics in solidarity with friends that she has known that have become addicted. Pt to increase senokot-s  To 2 tabs bid  and add 1 capful of Miralax bid tomorow afternoon if she feels she needs to move bowels but has to strain. Pt concerned about straining with BM after surgery.  Suggested she also use a small pillow to splint her abdomen when she bears down to move bowels. Pt aware of follow up appointments and office number [phone] if she has any questions or concerns.

## 2018-11-02 NOTE — Anesthesia Postprocedure Evaluation (Signed)
Anesthesia Post Note  Patient: Jordan Waters  Procedure(s) Performed: XI ROBOTIC ASSISTED TOTAL HYSTERECTOMY WITH BILATERAL SALPINGO OOPHORECTOMY (Bilateral Abdomen)     Patient location during evaluation: PACU Anesthesia Type: General Level of consciousness: awake and alert Pain management: pain level controlled Vital Signs Assessment: post-procedure vital signs reviewed and stable Respiratory status: spontaneous breathing, nonlabored ventilation, respiratory function stable and patient connected to nasal cannula oxygen Cardiovascular status: blood pressure returned to baseline and stable Postop Assessment: no apparent nausea or vomiting Anesthetic complications: no    Last Vitals:  Vitals:   10/29/18 1515 10/29/18 1619  BP: 117/79 123/72  Pulse: 74 74  Resp: 20   Temp: 36.4 C 36.8 C  SpO2: 100% 100%    Last Pain:  Vitals:   10/30/18 1005  TempSrc:   PainSc: [place]

## 2018-11-04 LAB — SURGICAL PATHOLOGY

## 2018-11-06 ENCOUNTER — Other Ambulatory Visit: Payer: Self-pay | Admitting: Hematology and Oncology

## 2018-11-06 DIAGNOSIS — C538 Malignant neoplasm of overlapping sites of cervix uteri: Secondary | ICD-10-CM

## 2018-11-18 ENCOUNTER — Encounter: Payer: Self-pay | Admitting: Gynecologic Oncology

## 2018-11-18 ENCOUNTER — Other Ambulatory Visit: Payer: Self-pay

## 2018-11-18 ENCOUNTER — Inpatient Hospital Stay (HOSPITAL_BASED_OUTPATIENT_CLINIC_OR_DEPARTMENT_OTHER): Payer: Managed Care, Other (non HMO) | Admitting: Gynecologic Oncology

## 2018-11-18 VITALS — BP 96/62 | HR 89 | Temp 98.3°F | Resp 18 | Ht 61.0 in | Wt 121.0 lb

## 2018-11-18 DIAGNOSIS — C538 Malignant neoplasm of overlapping sites of cervix uteri: Secondary | ICD-10-CM | POA: Diagnosis not present

## 2018-11-18 DIAGNOSIS — Z90722 Acquired absence of ovaries, bilateral: Secondary | ICD-10-CM

## 2018-11-18 DIAGNOSIS — Z90710 Acquired absence of both cervix and uterus: Secondary | ICD-10-CM

## 2018-11-18 DIAGNOSIS — Z7189 Other specified counseling: Secondary | ICD-10-CM

## 2018-11-18 NOTE — Progress Notes (Signed)
Follow-up Note: Gyn-Onc  Consult was initially requested by Dr. Penny Pia and Dr Kennon Rounds for the evaluation of Jordan Waters 43 y.o. female  CC:  Chief Complaint  Patient presents with  . Malignant neoplasm of overlapping sites of cervix [facility])    Assessment/Plan:  [name]  is a 43 y.o.  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  Together we discussed her pathology, her prognosis, and our plan for ongoing surveillance care.  She will follow-up with me in 3 months and then with Dr Sondra Come in 6 months.  We revisited precautions and restrictions still ongoing.  HPI: [name] is a 43 year old P2 who is seen in consultation at the request of Dr Kennon Rounds for evaluation of a cervical mass and bleeding.  The patient reports the onset of heavy vaginal bleeding since Fathers' Day 2020. This resulted in syncopal feelings on 07/24/18 which led to her visiting the [facility] ER the same day. A TVUS was performed which showed a possible endometrial mass.  An Hb was drawn which showed severe acute blood loss anemia of 7.7mg /dL.  She was admitted, received 2 units PRBC (with an appropriate corresponding rise in Hb to 9.7) and then received an MRI on 07/26/18. This showed the mildly enlarged anteverted uterus measures 10.7 x 5.4 x 5.5 cm. There is a 4.7 x 3.4 x 3.0 cm poorly marginated infiltrative mass centered in the anterior left uterine cervix which extends 12:00 to 4:00 in the lower cervix, and extends 12:00 to 8:00 in the upper cervix, demonstrating intermediate T2 signal intensity and enhancement. This mass demonstrates cervical stromal invasion of at least 6 mm with no frank parametrial invasion. Mass appears to invade the lower most portion of the uterine cavity. No evidence of vaginal or bladder invasion. Findings are worrisome for a  FIGO stage IB3 cervical carcinoma.  She is an otherwise very healthy woman with no chronic medical disease.  She has never had abdominal surgery.   She had regular pap tests up until a year or two ago. Her only abnormal pap was in pregnancy approximately 20 years ago which resulted in what sounds like either a LEEP or CKC in pregnancy and subsequently normal paps after that time.   She has had 2 prior vaginal deliveries.  She has never been vaccinated against HPV.   Biopsy of the cervix on 07/28/18 showed adenocarcinoma of the cervix.  PET showed no apparent involvement outside of the cervix and uterus.   She commenced primary chemoradiation on 08/04/18 (though chemotherapy was delayed due to lack of port access).   She completed radiation to the pelvis and cervix with Dr. Gery Pray with radiosensitizing chemotherapy completed on September 15, 2018.  She received a single HDR tandem and ring brachii therapy treatment with anticipation for completion hysterectomy given her excellent response to therapy and stage I bulky disease.  The goal of this treatment approach was to minimize long-term toxicity of excessive brachii therapy by debulking the primary site.  MRI was performed following completion of therapy on October 20, 2018 and revealed a uterus measuring 7.1 x 6 4 x 4.7 cm with a small mass in the cervix so showing peripheral contrast-enhancement and a central area of necrosis that is significantly decreased in size.  It currently measures 2.1 x 1.7 cm.  The right and left ovaries were normal.  There is no peritoneal  thickening or free fluid or lymphadenopathy appreciated.  Interval History:  On October 29, 2018 she underwent robotic assisted total hysterectomy (extrafascial) with BSO.  Intraoperative findings revealed no gross residual cervical tumor and no suspicious lymph nodes.  Final pathology revealed focal residual adenocarcinoma, post neoadjuvant therapy.  Margins were negative.   Is a grade 2 moderately differentiated tumor.  The origin of the tumor was endocervical.     Current Meds:  Outpatient Encounter Medications as of 11/18/2018  Medication Sig  . Ascorbic Acid (VITAMIN C) 100 MG tablet Take 100 mg by mouth daily.  . ferrous sulfate 325 (65 FE) MG tablet Take 1 tablet (325 mg total) by mouth 2 (two) times daily with a meal.  . gabapentin (NEURONTIN) 100 MG capsule Take 1 capsule (100 mg total) by mouth 3 (three) times daily. PATIENT NEEDS PHYSICAL PRIOR TO ANY FURTHER REFILLS  . ibuprofen (ADVIL) 800 MG tablet Take 1 tablet (800 mg total) by mouth every 8 (eight) hours as needed. For AFTER surgery only  . lidocaine-prilocaine (EMLA) cream Apply to affected area once (Patient taking differently: Apply 1 application topically as needed (port access). Apply to affected area once)  . Multiple Vitamin (MULTIVITAMIN WITH MINERALS) TABS tablet Take 1 tablet by mouth daily.  Marland Kitchen scopolamine (TRANSDERM-SCOP) 1 MG/3DAYS Place 1 patch (1.5 mg total) onto the skin every 3 (three) days.  . ondansetron (ZOFRAN) 8 MG tablet Take 1 tablet (8 mg total) by mouth every 8 (eight) hours as needed. (Patient not taking: Reported on 11/18/2018)  . prochlorperazine (COMPAZINE) 10 MG tablet Take 1 tablet (10 mg total) by mouth every 6 (six) hours as needed (Nausea or vomiting). (Patient not taking: Reported on 11/18/2018)  . senna-docusate (SENOKOT-S) 8.6-50 MG tablet Take 2 tablets by mouth at bedtime. For AFTER surgery, do not take if having diarrhea (Patient not taking: Reported on 10/26/2018)  . [DISCONTINUED] DULoxetine (CYMBALTA) 60 MG capsule Take 1 capsule (60 mg total) by mouth daily. (Patient not taking: Reported on 10/26/2018)  . [DISCONTINUED] hydrocortisone (ANUSOL-HC) 25 MG suppository Place 1 suppository (25 mg total) rectally 2 (two) times daily. (Patient not taking: Reported on 09/14/2018)   No facility-administered encounter medications on file as of 11/18/2018.     Allergy:  No Known Allergies  Social Hx:   Social History   Socioeconomic History  . Marital status: Married    Spouse name: Leane Para  . Number of children: 3  . Years of education: 53  . Highest education level: Not on file  Occupational History  . Not on file  Social Needs  . Financial resource strain: Not on file  . Food insecurity    Worry: Not on file    Inability: Not on file  . Transportation needs    Medical: Not on file    Non-medical: Not on file  Tobacco Use  . Smoking status: Former Smoker    Years: 8.00    Types: Cigarettes    Quit date: 02/15/2018    Years since quitting: 0.7  . Smokeless tobacco: Never Used  Substance and Sexual Activity  . Alcohol use: Not Currently  . Drug use: Never  . Sexual activity: Yes    Birth control/protection: Other-see comments    Comment: husband ---- vasectomy ;  abstinence since 06/ 2020  Lifestyle  . Physical activity    Days per week: Not on file    Minutes per session: Not on file  . Stress: Not on file  Relationships  .  Social Herbalist on phone: Not on file    Gets together: Not on file    Attends religious service: Not on file    Active member of club or organization: Not on file    Attends meetings of clubs or organizations: Not on file    Relationship status: Not on file  . Intimate partner violence    Fear of current or ex partner: Not on file    Emotionally abused: Not on file    Physically abused: Not on file    Forced sexual activity: Not on file  Other Topics Concern  . Not on file  Social History Narrative   Lives with husband, children   Caffeine ,limited    Past Surgical Hx:  Past Surgical History:  Procedure Laterality Date  . IR IMAGING GUIDED PORT INSERTION  08/11/2018  . OPERATIVE ULTRASOUND N/A 09/15/2018   Procedure: OPERATIVE ULTRASOUND;  Surgeon: Gery Pray, MD;  Location: [facility];  Service: Urology;  Laterality: N/A;  . ROBOTIC ASSISTED TOTAL HYSTERECTOMY WITH  BILATERAL SALPINGO OOPHERECTOMY Bilateral 10/29/2018   Procedure: XI ROBOTIC ASSISTED TOTAL HYSTERECTOMY WITH BILATERAL SALPINGO OOPHORECTOMY;  Surgeon: Everitt Amber, MD;  Location: [facility];  Service: Gynecology;  Laterality: Bilateral;  . TANDEM RING INSERTION N/A 09/15/2018   Procedure: TANDEM RING INSERTION;  Surgeon: Gery Pray, MD;  [facility]: [facility];  Service: Urology;  Laterality: N/A;  . WISDOM TOOTH EXTRACTION      Past Medical Hx:  Past Medical History:  Diagnosis Date  . Anemia   . Chemotherapy-induced nausea and vomiting    intermittant  . Depression with anxiety   . History of cancer chemotherapy    cervical cancer--- 08-17-2018 to 09-07-2018  . History of external beam radiation therapy    cervical cancer--- 08-13-2018 to 09-08-2018  . Intermittent diarrhea    secondary to radiation therapy  . Malignant neoplasm of overlapping sites of cervix [facility]) oncologist-- dr gorsuch/  dr Sondra Come   dx 07-28-2018--- Stage IB3--  completed chemotherapy 09-07-2018 and IMRT 09-08-2018  . Migraine   . Neuropathic pain   . Pancytopenia, acquired ([place])   . Pelvic mass in female 07/28/2018    Past Gynecological History:  See HPI  No LMP recorded. (Menstrual status: Perimenopausal).  Family Hx:  Family History  Problem Relation Age of Onset  . Heart disease Paternal Uncle   . Heart disease Paternal Grandfather   . Multiple sclerosis Mother   . Heart disease Father   . Multiple sclerosis Sister     Review of Systems:  Constitutional  Feels fatigued  ENT Normal appearing ears and nares bilaterally Skin/Breast  No rash, sores, jaundice, itching, dryness Cardiovascular  No chest pain, shortness of breath, or edema  Pulmonary  No cough or wheeze.  Gastro Intestinal  No nausea, vomitting, or diarrhoea. No bright red blood per rectum, no abdominal pain, change in bowel movement, or constipation.  Genito Urinary  No frequency, urgency, dysuria, no  bleeding Musculo Skeletal  No myalgia, arthralgia, joint swelling or pain  Neurologic  No weakness, numbness, change in gait,  Psychology  No depression, anxiety, insomnia.   Vitals:  Blood pressure 96/62, pulse 89, temperature 98.3 F (36.8 C), temperature source Temporal, resp. rate 18, height 5\' 1"  (1.549 m), weight 121 lb (54.9 kg), SpO2 100 %.  Physical Exam: WD in NAD Neck  Supple NROM, without any enlargements.  Lymph Node Survey No cervical supraclavicular or inguinal adenopathy Cardiovascular  Pulse normal rate, regularity and rhythm. S1 and S2 normal.  Lungs  Clear to auscultation bilateraly, without wheezes/crackles/rhonchi. Good air movement.  Skin  No rash/lesions/breakdown  Psychiatry  Alert and oriented to person, place, and time  Abdomen  Normoactive bowel sounds, abdomen soft, non-tender and thin without evidence of hernia. Well healed incisions + ecchymoses in flanks  Back No CVA tenderness Genito Urinary  Vulva/vagina: Normal external female genitalia.   No lesions. No discharge  Bladder/urethra:  No lesions or masses, well supported bladder  Vagina: cuff healing normally, no blood, no lesions. Sutures present.  Rectal  deferred Extremities  No bilateral cyanosis, clubbing or edema.   30 minutes of direct face to face counseling time was spent with the patient. This included discussion about prognosis, therapy recommendations and postoperative side effects and are beyond the scope of routine postoperative care.   Thereasa Solo, MD  11/18/2018, 5:22 PM

## 2018-11-18 NOTE — Patient Instructions (Signed)
The pathology showed that you are cancer free and do not need any more treatments.  Dr Denman George and Dr Sondra Come will monitor you closely over the next 5 years.  The first appointment with be in January, 2021.  Please contact Dr Serita Grit office (at [phone]) in November, 2020 when the schedule for 2021 will be released to request an appointment with her for January, 2021.

## 2018-11-27 ENCOUNTER — Other Ambulatory Visit: Payer: Self-pay | Admitting: Physician Assistant

## 2018-11-30 ENCOUNTER — Other Ambulatory Visit: Payer: Self-pay

## 2018-11-30 ENCOUNTER — Ambulatory Visit (HOSPITAL_COMMUNITY)
Admission: RE | Admit: 2018-11-30 | Discharge: 2018-11-30 | Disposition: A | Discharge status: Home / Self Care | Payer: Managed Care, Other (non HMO) | Source: Ambulatory Visit

## 2018-11-30 ENCOUNTER — Encounter (HOSPITAL_COMMUNITY): Payer: Self-pay

## 2018-11-30 ENCOUNTER — Ambulatory Visit (HOSPITAL_COMMUNITY)
Admission: RE | Admit: 2018-11-30 | Discharge: 2018-11-30 | Disposition: A | Discharge status: Home / Self Care | Payer: Managed Care, Other (non HMO) | Source: Ambulatory Visit | Attending: Hematology and Oncology | Admitting: Hematology and Oncology

## 2018-11-30 DIAGNOSIS — Z79899 Other long term (current) drug therapy: Secondary | ICD-10-CM | POA: Diagnosis not present

## 2018-11-30 DIAGNOSIS — Z452 Encounter for adjustment and management of vascular access device: Secondary | ICD-10-CM | POA: Insufficient documentation

## 2018-11-30 DIAGNOSIS — C538 Malignant neoplasm of overlapping sites of cervix uteri: Secondary | ICD-10-CM | POA: Insufficient documentation

## 2018-11-30 DIAGNOSIS — F329 Major depressive disorder, single episode, unspecified: Secondary | ICD-10-CM | POA: Diagnosis not present

## 2018-11-30 DIAGNOSIS — Z923 Personal history of irradiation: Secondary | ICD-10-CM | POA: Insufficient documentation

## 2018-11-30 DIAGNOSIS — F419 Anxiety disorder, unspecified: Secondary | ICD-10-CM | POA: Insufficient documentation

## 2018-11-30 DIAGNOSIS — Z9221 Personal history of antineoplastic chemotherapy: Secondary | ICD-10-CM | POA: Diagnosis not present

## 2018-11-30 HISTORY — PX: IR REMOVAL TUN ACCESS W/ PORT W/O FL MOD SED: IMG2290

## 2018-11-30 LAB — CBC
HCT: 37.1 % (ref 36.0–46.0)
Hemoglobin: 12.3 g/dL (ref 12.0–15.0)
MCH: 32.4 pg (ref 26.0–34.0)
MCHC: 33.2 g/dL (ref 30.0–36.0)
MCV: 97.6 fL (ref 80.0–100.0)
Platelets: 235 10*3/uL (ref 150–400)
RBC: 3.8 MIL/uL — ABNORMAL LOW (ref 3.87–5.11)
RDW: 12.7 % (ref 11.5–15.5)
WBC: 3.4 10*3/uL — ABNORMAL LOW (ref 4.0–10.5)
nRBC: 0 % (ref 0.0–0.2)

## 2018-11-30 LAB — PROTIME-INR
INR: 0.9 (ref 0.8–1.2)
Prothrombin Time: 12.3 seconds (ref 11.4–15.2)

## 2018-11-30 MED ORDER — FENTANYL CITRATE (PF) 100 MCG/2ML IJ SOLN
INTRAMUSCULAR | Status: AC
  Filled 2018-11-30: qty 2

## 2018-11-30 MED ORDER — LIDOCAINE HCL 1 % IJ SOLN
INTRAMUSCULAR | Status: AC
  Filled 2018-11-30: qty 20

## 2018-11-30 MED ORDER — CEFAZOLIN SODIUM-DEXTROSE 2-4 GM/100ML-% IV SOLN
2.0000 g | Freq: Once | INTRAVENOUS | Status: AC
  Administered 2018-11-30: 12:00:00 2 g via INTRAVENOUS

## 2018-11-30 MED ORDER — MIDAZOLAM HCL 2 MG/2ML IJ SOLN
INTRAMUSCULAR | Status: AC
  Filled 2018-11-30: qty 4

## 2018-11-30 MED ORDER — CEFAZOLIN SODIUM-DEXTROSE 2-4 GM/100ML-% IV SOLN
INTRAVENOUS | Status: AC
  Administered 2018-11-30: 12:00:00 2 g via INTRAVENOUS
  Filled 2018-11-30: qty 100

## 2018-11-30 MED ORDER — FENTANYL CITRATE (PF) 100 MCG/2ML IJ SOLN
INTRAMUSCULAR | Status: AC | PRN
  Administered 2018-11-30: 50 ug via INTRAVENOUS

## 2018-11-30 MED ORDER — MIDAZOLAM HCL 2 MG/2ML IJ SOLN
INTRAMUSCULAR | Status: AC | PRN
  Administered 2018-11-30 (×2): 1 mg via INTRAVENOUS

## 2018-11-30 MED ORDER — SODIUM CHLORIDE 0.9 % IV SOLN
INTRAVENOUS | Status: DC
  Administered 2018-11-30: 11:00:00 via INTRAVENOUS

## 2018-11-30 MED ORDER — LIDOCAINE HCL (PF) 1 % IJ SOLN
INTRAMUSCULAR | Status: AC | PRN
  Administered 2018-11-30: 10 mL

## 2018-11-30 NOTE — Progress Notes (Signed)
Referring Physician(s): Heath Lark  Supervising Physician: Aletta Edouard  Patient Status:  Jordan Waters OP  Chief Complaint: "I'm getting my port out"   Subjective: Patient familiar to IR service from Port-A-Cath placement on 08/11/2018.  She has a history of cervical cancer and is status post surgery and chemoradiation.  She is no longer using her Port-A-Cath and presents today for port removal.  She currently denies fever, headache, chest pain, dyspnea, cough, nausea, vomiting or bleeding.  She does have some mild abdominal and back discomfort.  Past Medical History:  Diagnosis Date   Anemia    Chemotherapy-induced nausea and vomiting    intermittant   Depression with anxiety    History of cancer chemotherapy    cervical cancer--- 08-17-2018 to 09-07-2018   History of external beam radiation therapy    cervical cancer--- 08-13-2018 to 09-08-2018   Intermittent diarrhea    secondary to radiation therapy   Malignant neoplasm of overlapping sites of cervix [facility]) oncologist-- dr gorsuch/  dr Sondra Come   dx 07-28-2018--- Stage IB3--  completed chemotherapy 09-07-2018 and IMRT 09-08-2018   Migraine    Neuropathic pain    Pancytopenia, acquired ([place])    Pelvic mass in female 07/28/2018   Past Surgical History:  Procedure Laterality Date   IR IMAGING GUIDED PORT INSERTION  08/11/2018   OPERATIVE ULTRASOUND N/A 09/15/2018   Procedure: OPERATIVE ULTRASOUND;  Surgeon: Gery Pray, MD;  Location[facility];  Service: Urology;  Laterality: N/A;   ROBOTIC ASSISTED TOTAL HYSTERECTOMY WITH BILATERAL SALPINGO OOPHERECTOMY Bilateral 10/29/2018   Procedure: XI ROBOTIC ASSISTED TOTAL HYSTERECTOMY WITH BILATERAL SALPINGO OOPHORECTOMY;  Surgeon: Everitt Amber, MD;  Location: [facility];  Service: Gynecology;  Laterality: Bilateral;   TANDEM RING INSERTION N/A 09/15/2018   Procedure: TANDEM RING INSERTION;  Surgeon: Gery Pray, MD;  Location: [facility];   Service: Urology;  Laterality: N/A;   WISDOM TOOTH EXTRACTION        Allergies: Patient has no known allergies.  Medications: Prior to Admission medications   Medication Sig Start Date End Date Taking? Authorizing Provider  Ascorbic Acid (VITAMIN C) 100 MG tablet Take 100 mg by mouth daily.   Yes [provider]  ferrous sulfate 325 (65 FE) MG tablet Take 1 tablet (325 mg total) by mouth 2 (two) times daily with a meal. 07/28/18  Yes Sloan Leiter, MD  Multiple Vitamin (MULTIVITAMIN WITH MINERALS) TABS tablet Take 1 tablet by mouth daily.   Yes [provider]  gabapentin (NEURONTIN) 100 MG capsule Take 1 capsule (100 mg total) by mouth 3 (three) times daily. PATIENT NEEDS PHYSICAL PRIOR TO ANY FURTHER REFILLS 08/31/18   Danford, Valetta Fuller D, NP  ibuprofen (ADVIL) 800 MG tablet Take 1 tablet (800 mg total) by mouth every 8 (eight) hours as needed. For AFTER surgery only 10/23/18   [name] D, NP  lidocaine-prilocaine (EMLA) cream Apply to affected area once Patient taking differently: Apply 1 application topically as needed (port access). Apply to affected area once 08/05/18   Heath Lark, MD  ondansetron (ZOFRAN) 8 MG tablet Take 1 tablet (8 mg total) by mouth every 8 (eight) hours as needed. Patient not taking: Reported on 11/18/2018 09/14/18   Heath Lark, MD  prochlorperazine (COMPAZINE) 10 MG tablet Take 1 tablet (10 mg total) by mouth every 6 (six) hours as needed (Nausea or vomiting). Patient not taking: Reported on 11/18/2018 09/14/18   Heath Lark, MD  scopolamine (TRANSDERM-SCOP) 1 MG/3DAYS Place 1 patch (  1.5 mg total) onto the skin every 3 (three) days. 09/07/18   Heath Lark, MD  senna-docusate (SENOKOT-S) 8.6-50 MG tablet Take 2 tablets by mouth at bedtime. For AFTER surgery, do not take if having diarrhea Patient not taking: Reported on 10/26/2018 10/23/18   [name] D, NP     Vital Signs: BP 128/70    Pulse 78    Temp 98.6 F (37 C) (Oral)    Resp 16     SpO2 100%   Physical Exam awake, alert.  Chest clear to auscultation bilaterally.  Clean, intact right chest wall Port-A-Cath.  Heart with regular rate and rhythm.  Abdomen soft, positive bowel sounds, some mild diffuse tenderness to palpation.  No lower extremity edema.  Imaging: No results found.  Labs:  CBC: Recent Labs    09/14/18 0756 09/21/18 0830 10/26/18 1152 11/30/18 1010  WBC 1.2* 0.7* 3.3* 3.4*  HGB 8.2* 9.6* 11.5* 12.3  HCT 24.6* 28.6* 35.7* 37.1  PLT 57* 81* 273 235    COAGS: Recent Labs    08/11/18 1034  INR 1.0    BMP: Recent Labs    09/03/18 1601 09/10/18 1543 09/14/18 0756 10/26/18 1152  NA 138 138 138 141  K 4.3 3.7 4.6 4.1  CL 105 104 104 107  CO2 25 27 25 26   GLUCOSE 92 97 90 76  BUN 7 9 8 6   CALCIUM 9.0 8.4* 8.8* 9.4  CREATININE 0.74 0.70 0.72 0.65  GFRNONAA >60 >60 >60 >60  GFRAA >60 >60 >60 >60    LIVER FUNCTION TESTS: Recent Labs    09/03/18 1601 09/10/18 1543 09/14/18 0756 10/26/18 1152  BILITOT 0.3 0.2* 0.3 0.5  AST 14* 12* 10* 15  ALT 12 10 10 14   ALKPHOS 47 45 54 43  PROT 6.4* 5.9* 6.4* 7.1  ALBUMIN 3.8 3.6 3.7 4.4    Assessment and Plan: Patient with history of cervical cancer, status post surgery and chemoradiation.  Port-A-Cath placed on 08/11/2018.  She is no longer using her Port-A-Cath and presents today for port removal.  Details/risks of procedure, including not limited to, internal bleeding, infection, injury to adjacent structures discussed with patient with her understanding and consent.   Electronically Signed: D. Rowe Robert, PA-C 11/30/2018, 11:45 AM   I spent a total of 20 minutes at the the patient's bedside AND on the patient's hospital floor or unit, greater than 50% of which was counseling/coordinating care for Port-A-Cath removal   Patient ID: Jordan Waters, female   DOB: 1975-09-23, 43 y.o.   MRN: [id number]

## 2018-11-30 NOTE — Procedures (Signed)
Interventional Radiology Procedure Note  Procedure: Port removal  Complications: None  Estimated Blood Loss: < 10 mL  Findings: Right chest port removed in entirety. Wound closed.  Corwyn Vora T. Daviona Herbert, M.D Pager:  [phone]    

## 2018-11-30 NOTE — Discharge Instructions (Signed)
Please call IR clinic at [phone] or after hours and ask for IR radiologist on call [phone] with any questions or concerns.  You may shower and remove your dressing tomorrow.  Moderate Conscious Sedation, Adult, Care After These instructions provide you with information about caring for yourself after your procedure. Your health care provider may also give you more specific instructions. Your treatment has been planned according to current medical practices, but problems sometimes occur. Call your health care provider if you have any problems or questions after your procedure. What can I expect after the procedure? After your procedure, it is common:  To feel sleepy for several hours.  To feel clumsy and have poor balance for several hours.  To have poor judgment for several hours.  To vomit if you eat too soon. Follow these instructions at home: For at least 24 hours after the procedure:   Do not: ? Participate in activities where you could fall or become injured. ? Drive. ? Use heavy machinery. ? Drink alcohol. ? Take sleeping pills or medicines that cause drowsiness. ? Make important decisions or sign legal documents. ? Take care of children on your own.  Rest. Eating and drinking  Follow the diet recommended by your health care provider.  If you vomit: ? Drink water, juice, or soup when you can drink without vomiting. ? Make sure you have little or no nausea before eating solid foods. General instructions  Have a responsible adult stay with you until you are awake and alert.  Take over-the-counter and prescription medicines only as told by your health care provider.  If you smoke, do not smoke without supervision.  Keep all follow-up visits as told by your health care provider. This is important. Contact a health care provider if:  You keep feeling nauseous or you keep vomiting.  You feel light-headed.  You develop a rash.  You have a fever. Get  help right away if:  You have trouble breathing. This information is not intended to replace advice given to you by your health care provider. Make sure you discuss any questions you have with your health care provider. Document Released: 10/28/2012 Document Revised: 12/20/2016 Document Reviewed: 04/29/2015 Elsevier Patient Education  2020 [place] Removal, Care After This sheet gives you information about how to care for yourself after your procedure. Your health care provider may also give you more specific instructions. If you have problems or questions, contact your health care provider. What can I expect after the procedure? After the procedure, it is common to have:  Soreness or pain near your incision.  Some swelling or bruising near your incision. Follow these instructions at home: Medicines  Take over-the-counter and prescription medicines only as told by your health care provider.  If you were prescribed an antibiotic medicine, take it as told by your health care provider. Do not stop taking the antibiotic even if you start to feel better. Bathing  Do not take baths, swim, or use a hot tub until your health care provider approves. Ask your health care provider if you can take showers. You may only be allowed to take sponge baths.  You may shower tomorrow. Incision care   Follow instructions from your health care provider about how to take care of your incision. Make sure you: ? Wash your hands with soap and water before you change your bandage (dressing). If soap and water are not available, use hand sanitizer. ? Change your dressing as  told by your health care provider.  You may remove your dressing tomorrow. ? Keep your dressing dry. ? Leave stitches (sutures), skin glue, or adhesive strips in place. These skin closures may need to stay in place for 2 weeks or longer. If adhesive strip edges start to loosen and curl up, you may trim the loose  edges. Do not remove adhesive strips completely unless your health care provider tells you to do that.  Check your incision area every day for signs of infection. Check for: ? More redness, swelling, or pain. ? More fluid or blood. ? Warmth. ? Pus or a bad smell. Driving   Do not drive for 24 hours if you were given a medicine to help you relax (sedative) during your procedure.  If you did not receive a sedative, ask your health care provider when it is safe to drive. Activity  Return to your normal activities as told by your health care provider. Ask your health care provider what activities are safe for you.  Do not lift anything that is heavier than 10 lb (4.5 kg), or the limit that you are told, until your health care provider says that it is safe.  Do not do activities that involve lifting your arms over your head. General instructions  Do not use any products that contain nicotine or tobacco, such as cigarettes and e-cigarettes. These can delay healing. If you need help quitting, ask your health care provider.  Keep all follow-up visits as told by your health care provider. This is important. Contact a health care provider if:  You have more redness, swelling, or pain around your incision.  You have more fluid or blood coming from your incision.  Your incision feels warm to the touch.  You have pus or a bad smell coming from your incision.  You have pain that is not relieved by your pain medicine. Get help right away if you have:  A fever or chills.  Chest pain.  Difficulty breathing. Summary  After the procedure, it is common to have pain, soreness, swelling, or bruising near your incision.  If you were prescribed an antibiotic medicine, take it as told by your health care provider. Do not stop taking the antibiotic even if you start to feel better.  Do not drive for 24 hours if you were given a sedative during your procedure.  Return to your normal  activities as told by your health care provider. Ask your health care provider what activities are safe for you. This information is not intended to replace advice given to you by your health care provider. Make sure you discuss any questions you have with your health care provider. Document Released: 12/19/2014 Document Revised: 02/20/2017 Document Reviewed: 02/20/2017 Elsevier Patient Education  2020 [employer].

## 2018-12-14 ENCOUNTER — Telehealth: Payer: Self-pay | Admitting: Oncology

## 2018-12-14 ENCOUNTER — Encounter: Payer: Self-pay | Admitting: Gynecologic Oncology

## 2018-12-14 NOTE — Telephone Encounter (Signed)
Told Jordan Waters that Dr. Denman George feels that the infection is superficial and the she should apply neosporin to incision 2-3 times a day and call if not better or if the site becomes more red and or if she begins to run a low grade temp of 100.4 or greater.  Pt asked if she is ok to take a bath. If she can can she use epsom's salts. Told her that this would be reviewed with [name] and would call her back with information.

## 2018-12-14 NOTE — Telephone Encounter (Signed)
Told Jordan Waters that [name], NP stated that she may bath but she is not to use epsom's salt. Pt verbalized understanding.

## 2018-12-14 NOTE — Telephone Encounter (Signed)
Asked [place] if she has seen any stitch material in the wound drainage and she said no.  She said the area is warm to the tough and painful.  She applied neosporin cream last night and this morning.

## 2018-12-14 NOTE — Telephone Encounter (Signed)
Jordan Waters called and said she thinks the incision on her upper abdomen is infected. It is draining "puss" that is yellow/clear in color.  She also said the area around the incision is red and sore.  She tried using alcohol wipes and antibacterial spray on the incision.  She denies having a fever.  She is wondering what else she needs to do.  Advised her to try to take a picture of the area and send it via mychart and we will call her back.

## 2018-12-28 ENCOUNTER — Telehealth: Payer: Self-pay | Admitting: Adult Health

## 2018-12-28 NOTE — Telephone Encounter (Signed)
Patient called states she just completed her Cancer treatments& is very apprehensive about coming out for an OV.  ---Pt knows her gabapentin refills require & CPE but ask Valetta Fuller to review her medical chart & see she has been seen/treated by CA doctor regularly and make an exception this time --Patient has scheduled a TELEHEALTH  tomorrow 12/8 w/ Valetta Fuller for refill of :    gabapentin (NEURONTIN) 100 MG capsule [id number]   Order Details Dose: 100 mg Route: Oral Frequency: 3 times daily  Dispense Quantity: 90 capsule Refills: 0 Fills remaining: --        Sig: Take 1 capsule (100 mg total) by mouth 3 (three) times daily. PATIENT NEEDS PHYSICAL PRIOR TO ANY FURTHER REFILLS       ----Patient uses :  [id number] [address], [place]. [phone] (Phone) [phone] (Fax)   --glh

## 2018-12-29 ENCOUNTER — Ambulatory Visit (INDEPENDENT_AMBULATORY_CARE_PROVIDER_SITE_OTHER): Payer: Managed Care, Other (non HMO) | Admitting: Adult Health

## 2018-12-29 ENCOUNTER — Other Ambulatory Visit: Payer: Self-pay

## 2018-12-29 ENCOUNTER — Telehealth: Payer: Self-pay | Admitting: Neurology

## 2018-12-29 ENCOUNTER — Encounter: Payer: Self-pay | Admitting: Adult Health

## 2018-12-29 DIAGNOSIS — M792 Neuralgia and neuritis, unspecified: Secondary | ICD-10-CM | POA: Diagnosis not present

## 2018-12-29 MED ORDER — GABAPENTIN 100 MG PO CAPS: 100.0000 mg | ORAL_CAPSULE | Freq: Three times a day (TID) | ORAL | 0 refills | Status: DC

## 2018-12-29 NOTE — Telephone Encounter (Signed)
Called pt back to get more information. She reports she is still having same sx that MD evaluated her for before. She was dx with cancer in July 2020 (had vaginal tumor that was removed), but now in remission. Constant headaches are not as severe as before, but located behind left eye. Does not feel they are migraines. She reports it feels like she has a "skull splitting pressure", tight sensation between shoulder blades. She denies being around anyone with covid-19 recently but states her sx started 01/2018. Wondering if she is post covid-19 even though she did not have classic signs/symptoms of covid-19 and only neurological sx. She would like to be seen and would prefer virtual visit if possible. Advised I will send message to MD and call her back to let her know next steps.

## 2018-12-29 NOTE — Telephone Encounter (Signed)
Pt called and stated that her primary care told her to be seen in the next couple of days by her neurologist. Pt can not wait till next available that the NP or MD has. Please advise.

## 2018-12-29 NOTE — Progress Notes (Signed)
Virtual Visit via Telephone Note  I connected with Jordan Waters on 12/29/18 at 10:00 AM EST by telephone and verified that I am speaking with the correct person using two identifiers.  Location: Patient: [facility] I discussed the limitations, risks, security and privacy concerns of performing an evaluation and management service by telephone and the availability of in person appointments. I also discussed with the patient that there may be a patient responsible charge related to this service. The patient expressed understanding and agreed to proceed.   History of Present Illness: 04/07/2018 OV: Jordan Waters is here to establish as a new pt.  She is a pleasant 43 year old female. PMH: Migraine  She denies any other medical conditions/complaints until Jan 2020- then developed significant pain from head to lower back She has experienced spike in fatigue and inability to practice yoga She reports decline in vision and difficulty ambulating She reports her mother (in her 28s) and her brother (in his 58s) bother has MS She has appt with [facility] tomorrow- great! She has been alternating OTC Acetaminophen and Ibuprofen with minimal pain relief Pain - Head: constant throb 9/10 Cervical Neck: constant electric throb 9/10 Thoracic- Lumbar Back: constant "hot posts throbbing" 9/10 She also reports numbness/tingling in all extremities 02/2018- CMP and CBC both stable She denies first degree family hx of diabetes, ca, any other neurological disorders She is accompanied by husband today She states recent depression sx's r/t to constant pain and "just not feeling like myself" She denies any acute accident/injury prior to onset of sx's She denies travel outside [place] or known exposure to TB  Of Note-  Recent ED Visit 03/20/2018 "43 year old female presents with multiple somatic complaints. There is no evidence of infection on exam. Patient has beenseenaturgent care and has  recently seen ear nose and throat. I suspect that this is psychogenic and somatization.ENT has recommended seeing neurology and I think this would be beneficial as she has a family history of multiple sclerosis. Laboratory studies obtained today. Awaiting results." 12/29/2018 OV: Jordan Waters calls in today for refill on Gabapentin 100mg   She stopped Gabapentin during chemo/radiation. She reports increasing neurological sx's, ie: -"Pressure of top L part of my skull"  -"Pain in between shoulder blades" -"Electrical pain in my joints" She denies change in vision/N/V. She denies imbalance issues.  Spring 2020 Neurological W/O- She had MRI Cervical Spine- stable Spinal Tap- neg for meningitis She has not followed up with Neurology/Dr. 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 F/u with Dr. Denman George 3 months and then with Dr Sondra Come in 6 months. Patient Care Team    Relationship Specialty Notifications Start End  Esaw Grandchild, NP PCP - General Family Medicine  03/20/18     Patient Active Problem List   Diagnosis Date Noted  . Diarrhea 08/17/2018  . Nausea with vomiting 08/17/2018  . Pancytopenia, acquired ([place]) 08/05/2018  . Malignant neoplasm of overlapping sites of cervix ([place]) 07/28/2018  . Cervical mass 07/26/2018  . Abnormal uterine bleeding (AUB) 07/25/2018  . Symptomatic anemia 07/25/2018  . Chronic migraine 04/08/2018  . Neck pain 04/08/2018  . Gait disturbance 04/08/2018  . Urinary incontinence 04/08/2018  . Depression with anxiety 04/08/2018  . Fatigue 04/07/2018  . Healthcare maintenance 04/07/2018  . Neuropathic pain 04/07/2018     Past Medical History:  Diagnosis Date  . Anemia   . Chemotherapy-induced nausea and vomiting  intermittant  . Depression with anxiety   . History of cancer chemotherapy    cervical  cancer--- 08-17-2018 to 09-07-2018  . History of external beam radiation therapy    cervical cancer--- 08-13-2018 to 09-08-2018  . Intermittent diarrhea    secondary to radiation therapy  . Malignant neoplasm of overlapping sites of cervix [facility]) oncologist-- dr gorsuch/  dr Sondra Come   dx 07-28-2018--- Stage IB3--  completed chemotherapy 09-07-2018 and IMRT 09-08-2018  . Migraine   . Neuropathic pain   . Pancytopenia, acquired ([place])   . Pelvic mass in female 07/28/2018     Past Surgical History:  Procedure Laterality Date  . IR IMAGING GUIDED PORT INSERTION  08/11/2018  . IR REMOVAL TUN ACCESS W/ PORT W/O FL MOD SED  11/30/2018  . OPERATIVE ULTRASOUND N/A 09/15/2018   Procedure: OPERATIVE ULTRASOUND;  Surgeon: Gery Pray, MD;  Location[facility];  Service: Urology;  Laterality: N/A;  . ROBOTIC ASSISTED TOTAL HYSTERECTOMY WITH BILATERAL SALPINGO OOPHERECTOMY Bilateral 10/29/2018   Procedure: XI ROBOTIC ASSISTED TOTAL HYSTERECTOMY WITH BILATERAL SALPINGO OOPHORECTOMY;  Surgeon: Everitt Amber, MD;  Location: [facility];  Service: Gynecology;  Laterality: Bilateral;  . TANDEM RING INSERTION N/A 09/15/2018   Procedure: TANDEM RING INSERTION;  Surgeon: Gery Pray, MD;  Location: [facility];  Service: Urology;  Laterality: N/A;  . WISDOM TOOTH EXTRACTION       Family History  Problem Relation Age of Onset  . Heart disease Paternal Uncle   . Heart disease Paternal Grandfather   . Multiple sclerosis Mother   . Heart disease Father   . Multiple sclerosis Sister      Social History   Substance and Sexual Activity  Drug Use Never     Social History   Substance and Sexual Activity  Alcohol Use Not Currently     Social History   Tobacco Use  Smoking Status Former Smoker  . Years: 8.00  . Types: Cigarettes  . Quit date: 02/15/2018  . Years since quitting: 0.8  Smokeless Tobacco Never Used     Outpatient Encounter Medications as of 12/29/2018   Medication Sig  . Ascorbic Acid (VITAMIN C) 100 MG tablet Take 100 mg by mouth daily.  . ferrous sulfate 325 (65 FE) MG tablet Take 1 tablet (325 mg total) by mouth 2 (two) times daily with a meal.  . gabapentin (NEURONTIN) 100 MG capsule Take 1 capsule (100 mg total) by mouth 3 (three) times daily. PATIENT NEEDS PHYSICAL PRIOR TO ANY FURTHER REFILLS  . ibuprofen (ADVIL) 800 MG tablet Take 1 tablet (800 mg total) by mouth every 8 (eight) hours as needed. For AFTER surgery only  . lidocaine-prilocaine (EMLA) cream Apply to affected area once (Patient taking differently: Apply 1 application topically as needed (port access). Apply to affected area once)  . Multiple Vitamin (MULTIVITAMIN WITH MINERALS) TABS tablet Take 1 tablet by mouth daily.  . ondansetron (ZOFRAN) 8 MG tablet Take 1 tablet (8 mg total) by mouth every 8 (eight) hours as needed. (Patient not taking: Reported on 11/18/2018)  . prochlorperazine (COMPAZINE) 10 MG tablet Take 1 tablet (10 mg total) by mouth every 6 (six) hours as needed (Nausea or vomiting). (Patient not taking: Reported on 11/18/2018)  . scopolamine (TRANSDERM-SCOP) 1 MG/3DAYS Place 1 patch (1.5 mg total) onto the skin every 3 (three) days.  Marland Kitchen senna-docusate (SENOKOT-S) 8.6-50 MG tablet Take 2 tablets by mouth at bedtime. For AFTER surgery, do not take if having diarrhea (Patient  not taking: Reported on 10/26/2018)   No facility-administered encounter medications on file as of 12/29/2018.     Allergies: Patient has no known allergies.  There is no height or weight on file to calculate BMI.  There were no vitals taken for this visit. Review of Systems: General:   Denies fever, chills, unexplained weight loss.  Optho/Auditory:   Denies visual changes, blurred vision/LOV Respiratory:   Denies SOB, DOE more than baseline levels.  Cardiovascular:   Denies chest pain, palpitations, new onset peripheral edema  Gastrointestinal:   Denies nausea, vomiting, diarrhea.   Genitourinary: Denies dysuria, freq/ urgency, flank pain or discharge from genitals.  Endocrine:     Denies hot or cold intolerance, polyuria, polydipsia. Musculoskeletal:   Denies unexplained myalgias, joint swelling, unexplained arthralgias, gait problems.  Skin:  Denies rash, suspicious lesions Neurological:  Denies dizziness, unexplained weakness, numbness  Psychiatric/Behavioral:   Denies mood changes, suicidal or homicidal ideations, hallucinations  Observations/Objective: No acute distress noted during telephone conversation.  Assessment and Plan: One time refill on Gabapentin 100mg  Q8H PRN-90 count F/u with Neurology/Dr. Felecia Shelling Continue close f/u with Dr. Kathrine Haddock. Sondra Come as directed. Continue to social distance and wear a mask when in public. Follow-up here 6 months Follow Up Instructions: Follow-up here 6 months   I discussed the assessment and treatment plan with the patient. The patient was provided an opportunity to ask questions and all were answered. The patient agreed with the plan and demonstrated an understanding of the instructions.   The patient was advised to call back or seek an in-person evaluation if the symptoms worsen or if the condition fails to improve as anticipated.  I provided 19 minutes of non-face-to-face time during this encounter.   Esaw Grandchild, NP

## 2018-12-29 NOTE — Telephone Encounter (Signed)
Called pt. Scheduled mychart video visit for 01/04/19 at 130pm with Dr. Felecia Shelling. She confirmed she can get into her mychart and received instructions. I also provided her mychart help desk number to call if any questions. I updated med list/pharamcy/allergy list. She will log on 10-15 min prior to appt on 01/04/19.

## 2018-12-29 NOTE — Telephone Encounter (Signed)
I can see her on Monday

## 2018-12-29 NOTE — Assessment & Plan Note (Signed)
Assessment and Plan: One time refill on Gabapentin 100mg  Q8H PRN-90 count F/u with Neurology/Dr. Felecia Shelling Continue close f/u with Dr. Kathrine Haddock. Sondra Come as directed. Continue to social distance and wear a mask when in public. Follow-up here 6 months Follow Up Instructions: Follow-up here 6 months   I discussed the assessment and treatment plan with the patient. The patient was provided an opportunity to ask questions and all were answered. The patient agreed with the plan and demonstrated an understanding of the instructions.   The patient was advised to call back or seek an in-person evaluation if the symptoms worsen or if the condition fails to improve as anticipated.

## 2018-12-30 ENCOUNTER — Telehealth: Payer: Self-pay | Admitting: *Deleted

## 2018-12-30 NOTE — Telephone Encounter (Signed)
Returned Pt's phone call regarding a letter for her child's school stating that she is immunocompromised. Pt notified that because he last chemotherapy treatment was in August, and her last WBC was WNL, we can not provide a note stating that she is immunocompromised at this time

## 2018-12-30 NOTE — Telephone Encounter (Signed)
Patient called and stated "Since I am now in remission, the school says my son can no longer do remote learning. He needs to back in school. I explained that I am only out 1 month and I have no immune system. He can't go back.They said I will need a letter."

## 2019-01-04 ENCOUNTER — Telehealth (INDEPENDENT_AMBULATORY_CARE_PROVIDER_SITE_OTHER): Payer: Managed Care, Other (non HMO) | Admitting: Neurology

## 2019-01-04 ENCOUNTER — Encounter: Payer: Self-pay | Admitting: Neurology

## 2019-01-04 DIAGNOSIS — M542 Cervicalgia: Secondary | ICD-10-CM | POA: Diagnosis not present

## 2019-01-04 DIAGNOSIS — R208 Other disturbances of skin sensation: Secondary | ICD-10-CM

## 2019-01-04 DIAGNOSIS — IMO0002 Reserved for concepts with insufficient information to code with codable children: Secondary | ICD-10-CM

## 2019-01-04 DIAGNOSIS — R5383 Other fatigue: Secondary | ICD-10-CM | POA: Diagnosis not present

## 2019-01-04 DIAGNOSIS — G43709 Chronic migraine without aura, not intractable, without status migrainosus: Secondary | ICD-10-CM | POA: Diagnosis not present

## 2019-01-04 DIAGNOSIS — M791 Myalgia, unspecified site: Secondary | ICD-10-CM

## 2019-01-04 MED ORDER — GABAPENTIN 100 MG PO CAPS: 200.0000 mg | ORAL_CAPSULE | Freq: Three times a day (TID) | ORAL | 11 refills | Status: DC

## 2019-01-04 MED ORDER — DULOXETINE HCL 60 MG PO CPEP: 60.0000 mg | ORAL_CAPSULE | Freq: Every day | ORAL | 11 refills | Status: DC

## 2019-01-04 NOTE — Progress Notes (Signed)
[facility]  PATIENT: Jordan Waters DOB: [birth year]  REFERRING DOCTOR OR PCP:  Mina Marble, NP SOURCE: patient, notes from ED (04/04/2018) and PCP, imaging reports, CT images personally reviewed.  _________________________________   HISTORICAL  CHIEF COMPLAINT:  Chief Complaint  Patient presents with  . Pain   Virtual Visit via Video Note I connected with Jordan Waters  on 01/04/19 at  1:30 PM EST by a video enabled telemedicine application and verified that I am speaking with the correct person.  I discussed the limitations of evaluation and management by telemedicine and the availability of in person appointments. The patient expressed understanding and agreed to proceed.  History of Present Illness: At the initial visit, lumbar puncture was performed due to the headaches, other symptoms and her concern for MS.  The results of the CSF was normal.  We also checked an MRI of the cervical spine.  The spinal cord was normal.  She had mild degenerative changes.  We prescribed duloxetine at the last visit and she felt the combination with gabapentin had helped.  Currently, she is reporting electrical sensations in her shoulders and hips.   Myalgias worsened after she stopped the duloxetine.  She feels the neck is tight and painful.       She is reporting headaches that are bilateral..    She feels these are different than her typical migraine headaches and she does not have photophobia or nausea..  She also notes some fatigue.  Gabapentin was recently restarted at 100 mg po tid (dose she was on in past).    She is also on Naprosyn.    She was diagnosed with uterine cancer and had a hysterectomy 10/2018.     She has had chemo and radiation as well.    She   Observations/Objective: She is a well-developed well-nourished woman in no acute distress.  The head is normocephalic and atraumatic.  Sclera are anicteric.  Visible skin appears normal.  The neck has a good  range of motion.  She is alert and fully oriented with fluent speech and good attention, knowledge and memory.  Extraocular muscles are intact.  Facial strength is normal.  She appears to have normal strength in the arms.  Rapid alternating movements and finger-nose-finger are performed well.  Assessment and Plan: Dysesthesia  Chronic migraine  Fatigue, unspecified type  Neck pain  Myalgia  1.   Increase gabapentin from 100 mg p.o. 3 times daily to 200 mg p.o. 3 times daily. 2.   Add duloxetine 60 mg. 3.   Stay active and exercise as tolerated. 4.   Return in 6 months or sooner if there are new or worsening neurologic symptoms.  Follow Up Instructions: I discussed the assessment and treatment plan with the patient. The patient was provided an opportunity to ask questions and all were answered. The patient agreed with the plan and demonstrated an understanding of the instructions.    The patient was advised to call back or seek an in-person evaluation if the symptoms worsen or if the condition fails to improve as anticipated.  I provided 25 minutes of non-face-to-face time during this encounter.    From initial evaluation 04/08/2018:  I had the pleasure seeing a patient, Jordan Waters, at [facility] for neurologic consultation regarding her headaches.  She is a 43 year old woman who has had a daily headache since January.    She has a tight sensation in her head and neck. The neck pain radiates down  the spine.  The pain is symmetric.   She has had nausea daily but no vomiting.   She notes photophobia and phonophobia.    Moving her head does not greatly alter the intensity.     She notes pain going into all 4 limbs and she feels all are clumsy.   She has been dropping items.   Her speech is slurred at times.    She denies bladder issues but had incontinence in the office.  She has seen primary care/urgent care and was diagnosed with sinusitis.  She was prescribed  amoxicillin and told to take acetaminophen initially.    A dentist noted cracked wisdom teeth and these were pulled without benefit.    Ibuprofen was also prescribed without benefit.   She also saw ENT who saw no evidence of sinusitis.   She went back to urgent care and was prescribed prednisone and Flexeril without benefit.    She notes diplopia and night sweats 04/03/2018.   She presented to the emergency room 04/04/2018 due to the persistant headache.  She was prescribed NSAIDs.    Yesterday, gabapentin was prescribed.      She has a long history of migraines but they have never persisted.   Triptans gave her nightmares so she has not taken recently.      She reports depression and anxiety, worse since symptoms started.    CT scan of the head and cervical spine was performed and was read as normal.  I personally reviewed the images.  The CT scan of the head was normal.  The CT scan of the cervical spine showed no significant degenerative changes.  REVIEW OF SYSTEMS: Constitutional: No fevers, chills,  or change in appetite.  She reports night sweats. Eyes: No visual changes, double vision, eye pain Ear, nose and throat: No hearing loss, ear pain, nasal congestion, sore throat Cardiovascular: No chest pain, palpitations Respiratory: No shortness of breath at rest or with exertion.   No wheezes GastrointestinaI: No nausea, vomiting, diarrhea, abdominal pain, fecal incontinence Genitourinary: No dysuria, urinary retention or frequency.  No nocturia.  Although she does not have a history of incontinence, she did have some doing the examination today. Musculoskeletal:See above. Integumentary: No rash, pruritus, skin lesions Neurological: as above Psychiatric: She has depression and anxiety. Endocrine: No palpitations, diaphoresis, change in appetite, change in weigh or increased thirst Hematologic/Lymphatic: No anemia, purpura, petechiae. Allergic/Immunologic: No itchy/runny eyes, nasal  congestion, recent allergic reactions, rashes  ALLERGIES: No Known Allergies  HOME MEDICATIONS:  Current Outpatient Medications:  .  Ascorbic Acid (VITAMIN C) 100 MG tablet, Take 100 mg by mouth daily., Disp: , Rfl:  .  DULoxetine (CYMBALTA) 60 MG capsule, Take 1 capsule (60 mg total) by mouth daily., Disp: 30 capsule, Rfl: 11 .  ELDERBERRY PO, Take 2 each by mouth daily., Disp: , Rfl:  .  gabapentin (NEURONTIN) 100 MG capsule, Take 2 capsules (200 mg total) by mouth 3 (three) times daily. Final refill, Disp: 180 capsule, Rfl: 11 .  Multiple Vitamin (MULTIVITAMIN WITH MINERALS) TABS tablet, Take 1 tablet by mouth daily., Disp: , Rfl:  .  Multiple Vitamins-Minerals (HAIR SKIN AND NAILS FORMULA PO), Take 2 capsules by mouth daily. Plus Biotin, Disp: , Rfl:   PAST MEDICAL HISTORY: Past Medical History:  Diagnosis Date  . Anemia   . Chemotherapy-induced nausea and vomiting    intermittant  . Depression with anxiety   . History of cancer chemotherapy    cervical cancer--- 08-17-2018  to 09-07-2018  . History of external beam radiation therapy    cervical cancer--- 08-13-2018 to 09-08-2018  . Intermittent diarrhea    secondary to radiation therapy  . Malignant neoplasm of overlapping sites of cervix [facility]) oncologist-- dr gorsuch/  dr Sondra Come   dx 07-28-2018--- Stage IB3--  completed chemotherapy 09-07-2018 and IMRT 09-08-2018  . Migraine   . Neuropathic pain   . Pancytopenia, acquired ([place])   . Pelvic mass in female 07/28/2018    PAST SURGICAL HISTORY: Past Surgical History:  Procedure Laterality Date  . IR IMAGING GUIDED PORT INSERTION  08/11/2018  . IR REMOVAL TUN ACCESS W/ PORT W/O FL MOD SED  11/30/2018  . OPERATIVE ULTRASOUND N/A 09/15/2018   Procedure: OPERATIVE ULTRASOUND;  Surgeon: Gery Pray, MD;  Location: [facility];  Service: Urology;  Laterality: N/A;  . ROBOTIC ASSISTED TOTAL HYSTERECTOMY WITH BILATERAL SALPINGO OOPHERECTOMY Bilateral 10/29/2018    Procedure: XI ROBOTIC ASSISTED TOTAL HYSTERECTOMY WITH BILATERAL SALPINGO OOPHORECTOMY;  Surgeon: Everitt Amber, MD;  Location: [facility];  Service: Gynecology;  Laterality: Bilateral;  . TANDEM RING INSERTION N/A 09/15/2018   Procedure: TANDEM RING INSERTION;  Surgeon: Gery Pray, MD;  Location[facility];  Service: Urology;  Laterality: N/A;  . WISDOM TOOTH EXTRACTION      FAMILY HISTORY: Family History  Problem Relation Age of Onset  . Heart disease Paternal Uncle   . Heart disease Paternal Grandfather   . Multiple sclerosis Mother   . Heart disease Father   . Multiple sclerosis Sister     SOCIAL HISTORY:  Social History   Socioeconomic History  . Marital status: Married    Spouse name: Leane Para  . Number of children: 3  . Years of education: 56  . Highest education level: Not on file  Occupational History  . Not on file  Tobacco Use  . Smoking status: Former Smoker    Years: 8.00    Types: Cigarettes    Quit date: 02/15/2018    Years since quitting: 0.8  . Smokeless tobacco: Never Used  Substance and Sexual Activity  . Alcohol use: Not Currently  . Drug use: Never  . Sexual activity: Yes    Birth control/protection: Other-see comments    Comment: husband ---- vasectomy ;  abstinence since 06/ 2020  Other Topics Concern  . Not on file  Social History Narrative   Lives with husband, children   Caffeine ,limited   Social Determinants of Health   Financial Resource Strain:   . Difficulty of Paying Living Expenses: Not on file  Food Insecurity:   . Worried About Charity fundraiser in the Last Year: Not on file  . Ran Out of Food in the Last Year: Not on file  Transportation Needs:   . Lack of Transportation (Medical): Not on file  . Lack of Transportation (Non-Medical): Not on file  Physical Activity:   . Days of Exercise per Week: Not on file  . Minutes of Exercise per Session: Not on file  Stress:   . Feeling of Stress : Not on file  Social  Connections:   . Frequency of Communication with Friends and Family: Not on file  . Frequency of Social Gatherings with Friends and Family: Not on file  . Attends Religious Services: Not on file  . Active Member of Clubs or Organizations: Not on file  . Attends Archivist Meetings: Not on file  . Marital Status: Not on file  Intimate Partner  Violence:   . Fear of Current or Ex-Partner: Not on file  . Emotionally Abused: Not on file  . Physically Abused: Not on file  . Sexually Abused: Not on file     DIAGNOSTIC DATA (LABS, IMAGING, TESTING) - I reviewed patient records, labs, notes, testing and imaging myself where available.  Lab Results  Component Value Date   WBC 3.4 (L) 11/30/2018   HGB 12.3 11/30/2018   HCT 37.1 11/30/2018   MCV 97.6 11/30/2018   PLT 235 11/30/2018      Component Value Date/Time   NA 141 10/26/2018 1152   NA 140 03/20/2018 1237   K 4.1 10/26/2018 1152   CL 107 10/26/2018 1152   CO2 26 10/26/2018 1152   GLUCOSE 76 10/26/2018 1152   BUN 6 10/26/2018 1152   BUN 7 03/20/2018 1237   CREATININE 0.65 10/26/2018 1152   CREATININE 0.71 08/13/2018 1614   CALCIUM 9.4 10/26/2018 1152   PROT 7.1 10/26/2018 1152   PROT 7.0 03/20/2018 1237   ALBUMIN 4.4 10/26/2018 1152   ALBUMIN 3.8 04/08/2018 1641   AST 15 10/26/2018 1152   AST 13 (L) 08/13/2018 1614   ALT 14 10/26/2018 1152   ALT 8 08/13/2018 1614   ALKPHOS 43 10/26/2018 1152   BILITOT 0.5 10/26/2018 1152   BILITOT 0.3 08/13/2018 1614   GFRNONAA >60 10/26/2018 1152   GFRNONAA >60 08/13/2018 1614   GFRAA >60 10/26/2018 1152   GFRAA >60 08/13/2018 1614    Lab Results  Component Value Date   TSH 1.465 07/25/2018      Jaculin Rasmus A. Felecia Shelling, MD, PhD, FAAN Certified in Neurology, Clinical Neurophysiology, Sleep Medicine, Pain Medicine and Neuroimaging Director, [facility] [address], [place],  [place] [address] [phone]

## 2019-02-02 ENCOUNTER — Telehealth: Payer: Self-pay | Admitting: Oncology

## 2019-02-02 NOTE — Telephone Encounter (Addendum)
Laurence called and said she has been having a small amount of vaginal spotting that is brownish/pink in color for the past couple of days. She denies having intercourse. She is also having some cramping in her abdomen.  She is wondering if this is normal so long after having surgery - surgery was 10/29/2018.  Discussed with [name], NP and follow up appointment has been scheduled for 02/04/19 at 11:00 am.  Kaelen was informed of appointment and verbalized agreement of date and time.

## 2019-02-04 ENCOUNTER — Encounter: Payer: Self-pay | Admitting: Gynecologic Oncology

## 2019-02-04 ENCOUNTER — Inpatient Hospital Stay: Payer: Managed Care, Other (non HMO) | Attending: Radiation Oncology | Admitting: Gynecologic Oncology

## 2019-02-04 ENCOUNTER — Other Ambulatory Visit: Payer: Self-pay

## 2019-02-04 ENCOUNTER — Inpatient Hospital Stay: Payer: Managed Care, Other (non HMO)

## 2019-02-04 VITALS — BP 116/64 | HR 118 | Temp 98.7°F | Resp 16 | Ht 61.0 in | Wt 119.0 lb

## 2019-02-04 DIAGNOSIS — Z9221 Personal history of antineoplastic chemotherapy: Secondary | ICD-10-CM | POA: Diagnosis not present

## 2019-02-04 DIAGNOSIS — Z87891 Personal history of nicotine dependence: Secondary | ICD-10-CM | POA: Insufficient documentation

## 2019-02-04 DIAGNOSIS — Z90710 Acquired absence of both cervix and uterus: Secondary | ICD-10-CM

## 2019-02-04 DIAGNOSIS — Z79899 Other long term (current) drug therapy: Secondary | ICD-10-CM | POA: Insufficient documentation

## 2019-02-04 DIAGNOSIS — Z923 Personal history of irradiation: Secondary | ICD-10-CM

## 2019-02-04 DIAGNOSIS — R102 Pelvic and perineal pain: Secondary | ICD-10-CM | POA: Diagnosis not present

## 2019-02-04 DIAGNOSIS — N952 Postmenopausal atrophic vaginitis: Secondary | ICD-10-CM | POA: Diagnosis not present

## 2019-02-04 DIAGNOSIS — N939 Abnormal uterine and vaginal bleeding, unspecified: Secondary | ICD-10-CM

## 2019-02-04 DIAGNOSIS — C530 Malignant neoplasm of endocervix: Secondary | ICD-10-CM

## 2019-02-04 DIAGNOSIS — C538 Malignant neoplasm of overlapping sites of cervix uteri: Secondary | ICD-10-CM

## 2019-02-04 DIAGNOSIS — M25559 Pain in unspecified hip: Secondary | ICD-10-CM

## 2019-02-04 DIAGNOSIS — Z90722 Acquired absence of ovaries, bilateral: Secondary | ICD-10-CM

## 2019-02-04 MED ORDER — PREMARIN 0.625 MG/GM VA CREA: 1.0000 | TOPICAL_CREAM | VAGINAL | 12 refills | Status: DC

## 2019-02-04 NOTE — Patient Instructions (Signed)
Dr Denman George did not see abnormalities on examination. The tissues of the vagina are thin. She recommends vaginal estrogen cream Monday/Wednesday/Friday at night before bed. She recommends use of the vaginal dilator that Dr Sondra Come will issue you when you see him in 3 months.  While you are having vaginal bleeding or pelvic pain, you should avoid intercourse as this will make the bleeding worse and pain.  Dr Denman George is checking a urine culture to ensure that you don't have an infection.  Dr Denman George is also checking a CT scan to rule out another explanation for your pelvic fullness and pain.   Dr Sondra Come will see you in 3 months.  Dr Denman George will see you again in 6 months.

## 2019-02-04 NOTE — Progress Notes (Signed)
Follow-up Note: Gyn-Onc  Consult was initially requested by Dr. Penny Pia and Dr Kennon Rounds for the evaluation of Jordan Waters 44 y.o. female  CC:  Chief Complaint  Patient presents with  . Malignant neoplasm of overlapping sites of cervix ([place])  . Vaginal Bleeding    Assessment/Plan:  [name]  is a 44 y.o.  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  New symptoms of vaginal spotting: On examination there was no evidence of recurrence.  We will check a urine culture and CT scan of the pelvis to evaluate for occult infection or disease that I cannot detect on physical examination.  I suspect her vaginal spotting secondary to vaginal atrophy and radiation changes.  I have prescribed her Premarin cream to use vaginally every other night.  She will follow-up with me in 6 months and then with Dr Sondra Come in 3 months.  HPI: [name] is a 44 year old P2 who is seen in consultation at the request of Dr Kennon Rounds for evaluation of a cervical mass and bleeding.  The patient reports the onset of heavy vaginal bleeding since Fathers' Day 2020. This resulted in syncopal feelings on 07/24/18 which led to her visiting the [facility] ER the same day. A TVUS was performed which showed a possible endometrial mass.  An Hb was drawn which showed severe acute blood loss anemia of 7.7mg /dL.  She was admitted, received 2 units PRBC (with an appropriate corresponding rise in Hb to 9.7) and then received an MRI on 07/26/18. This showed the mildly enlarged anteverted uterus measures 10.7 x 5.4 x 5.5 cm. There is a 4.7 x 3.4 x 3.0 cm poorly marginated infiltrative mass centered in the anterior left uterine cervix which extends 12:00 to 4:00 in the lower cervix, and extends 12:00 to 8:00 in the upper cervix, demonstrating intermediate T2 signal intensity and  enhancement. This mass demonstrates cervical stromal invasion of at least 6 mm with no frank parametrial invasion. Mass appears to invade the lower most portion of the uterine cavity. No evidence of vaginal or bladder invasion. Findings are worrisome for a FIGO stage IB3 cervical carcinoma.  She is an otherwise very healthy woman with no chronic medical disease.  She has never had abdominal surgery.   She had regular pap tests up until a year or two ago. Her only abnormal pap was in pregnancy approximately 20 years ago which resulted in what sounds like either a LEEP or CKC in pregnancy and subsequently normal paps after that time.   She has had 2 prior vaginal deliveries.  She has never been vaccinated against HPV.   Biopsy of the cervix on 07/28/18 showed adenocarcinoma of the cervix.  PET showed no apparent involvement outside of the cervix and uterus.   She commenced primary chemoradiation on 08/04/18 (though chemotherapy was delayed due to lack of port access).   She completed radiation to the pelvis and cervix with Dr. Gery Pray with radiosensitizing chemotherapy completed on September 15, 2018.  She received a single HDR tandem and ring brachii therapy treatment with anticipation for completion hysterectomy given her excellent response to therapy and stage I bulky disease.  The goal of this treatment approach was to minimize long-term toxicity of excessive brachii therapy by debulking the primary site.  MRI was performed following completion of therapy on October 20, 2018 and revealed a uterus measuring 7.1 x 6  4 x 4.7 cm with a small mass in the cervix so showing peripheral contrast-enhancement and a central area of necrosis that is significantly decreased in size.  It currently measures 2.1 x 1.7 cm.  The right and left ovaries were normal.  There is no peritoneal thickening or free fluid or lymphadenopathy appreciated.  On October 29, 2018 she underwent robotic assisted total  hysterectomy (extrafascial) with BSO.  Intraoperative findings revealed no gross residual cervical tumor and no suspicious lymph nodes.  Final pathology revealed focal residual adenocarcinoma, post neoadjuvant therapy.  Margins were negative.  Is a grade 2 moderately differentiated tumor.  The origin of the tumor was endocervical.  Interval History:  She returns today slightly earlier than planned for new symptom of vaginal spotting.  She reports a 1 week history of dark red blood from the vagina worse with straining for a bowel movement.  She also reported pelvic pressure and generalized discomfort.  She denies being sexually active since treatment of her cervical cancer.     Current Meds:  Outpatient Encounter Medications as of 02/04/2019  Medication Sig  . Ascorbic Acid (VITAMIN C) 100 MG tablet Take 100 mg by mouth daily.  Derrill Memo ON 02/05/2019] conjugated estrogens (PREMARIN) vaginal cream Place 1 Applicatorful vaginally 3 (three) times a week.  . DULoxetine (CYMBALTA) 60 MG capsule Take 1 capsule (60 mg total) by mouth daily.  Marland Kitchen ELDERBERRY PO Take 2 each by mouth daily.  Marland Kitchen gabapentin (NEURONTIN) 100 MG capsule Take 2 capsules (200 mg total) by mouth 3 (three) times daily. Final refill  . Multiple Vitamin (MULTIVITAMIN WITH MINERALS) TABS tablet Take 1 tablet by mouth daily.  . Multiple Vitamins-Minerals (HAIR SKIN AND NAILS FORMULA PO) Take 2 capsules by mouth daily. Plus Biotin   No facility-administered encounter medications on file as of 02/04/2019.    Allergy: No Known Allergies  Social Hx:   Social History   Socioeconomic History  . Marital status: Married    Spouse name: Leane Para  . Number of children: 3  . Years of education: 56  . Highest education level: Not on file  Occupational History  . Not on file  Tobacco Use  . Smoking status: Former Smoker    Years: 8.00    Types: Cigarettes    Quit date: 02/15/2018    Years since quitting: 0.9  . Smokeless tobacco: Never  Used  Substance and Sexual Activity  . Alcohol use: Not Currently  . Drug use: Never  . Sexual activity: Yes    Birth control/protection: Other-see comments    Comment: husband ---- vasectomy ;  abstinence since 06/ 2020  Other Topics Concern  . Not on file  Social History Narrative   Lives with husband, children   Caffeine ,limited   Social Determinants of Health   Financial Resource Strain:   . Difficulty of Paying Living Expenses: Not on file  Food Insecurity:   . Worried About Charity fundraiser in the Last Year: Not on file  . Ran Out of Food in the Last Year: Not on file  Transportation Needs:   . Lack of Transportation (Medical): Not on file  . Lack of Transportation (Non-Medical): Not on file  Physical Activity:   . Days of Exercise per Week: Not on file  . Minutes of Exercise per Session: Not on file  Stress:   . Feeling of Stress : Not on file  Social Connections:   . Frequency of Communication with Friends and Family: Not  on file  . Frequency of Social Gatherings with Friends and Family: Not on file  . Attends Religious Services: Not on file  . Active Member of Clubs or Organizations: Not on file  . Attends Archivist Meetings: Not on file  . Marital Status: Not on file  Intimate Partner Violence:   . Fear of Current or Ex-Partner: Not on file  . Emotionally Abused: Not on file  . Physically Abused: Not on file  . Sexually Abused: Not on file    Past Surgical Hx:  Past Surgical History:  Procedure Laterality Date  . IR IMAGING GUIDED PORT INSERTION  08/11/2018  . IR REMOVAL TUN ACCESS W/ PORT W/O FL MOD SED  11/30/2018  . OPERATIVE ULTRASOUND N/A 09/15/2018   Procedure: OPERATIVE ULTRASOUND;  Surgeon: Gery Pray, MD;  Location: [facility];  Service: Urology;  Laterality: N/A;  . ROBOTIC ASSISTED TOTAL HYSTERECTOMY WITH BILATERAL SALPINGO OOPHERECTOMY Bilateral 10/29/2018   Procedure: XI ROBOTIC ASSISTED TOTAL HYSTERECTOMY WITH  BILATERAL SALPINGO OOPHORECTOMY;  Surgeon: Everitt Amber, MD;  Location: [facility];  Service: Gynecology;  Laterality: Bilateral;  . TANDEM RING INSERTION N/A 09/15/2018   Procedure: TANDEM RING INSERTION;  Surgeon: Gery Pray, MD;  Location: [facility];  Service: Urology;  Laterality: N/A;  . WISDOM TOOTH EXTRACTION      Past Medical Hx:  Past Medical History:  Diagnosis Date  . Anemia   . Chemotherapy-induced nausea and vomiting    intermittant  . Depression with anxiety   . History of cancer chemotherapy    cervical cancer--- 08-17-2018 to 09-07-2018  . History of external beam radiation therapy    cervical cancer--- 08-13-2018 to 09-08-2018  . Intermittent diarrhea    secondary to radiation therapy  . Malignant neoplasm of overlapping sites of cervix [facility]) oncologist-- dr gorsuch/  dr Sondra Come   dx 07-28-2018--- Stage IB3--  completed chemotherapy 09-07-2018 and IMRT 09-08-2018  . Migraine   . Neuropathic pain   . Pancytopenia, acquired ([place])   . Pelvic mass in female 07/28/2018    Past Gynecological History:  See HPI  No LMP recorded. Patient has had a hysterectomy.  Family Hx:  Family History  Problem Relation Age of Onset  . Heart disease Paternal Uncle   . Heart disease Paternal Grandfather   . Multiple sclerosis Mother   . Heart disease Father   . Multiple sclerosis Sister     Review of Systems:  Constitutional  Feels fatigued  ENT Normal appearing ears and nares bilaterally Skin/Breast  No rash, sores, jaundice, itching, dryness Cardiovascular  No chest pain, shortness of breath, or edema  Pulmonary  No cough or wheeze.  Gastro Intestinal  No nausea, vomitting, or diarrhoea. No bright red blood per rectum, no abdominal pain, change in bowel movement, or constipation.  Genito Urinary  No frequency, urgency, dysuria, +bleeding Musculo Skeletal  No myalgia, arthralgia, joint swelling or pain  Neurologic  No weakness, numbness, change in  gait,  Psychology  No depression, anxiety, insomnia.   Vitals:  Blood pressure 116/64, pulse (!) 118, temperature 98.7 F (37.1 C), temperature source Temporal, resp. rate 16, height 5\' 1"  (1.549 m), weight 119 lb (54 kg), SpO2 98 %.  Physical Exam: WD in NAD Neck  Supple NROM, without any enlargements.  Lymph Node Survey No cervical supraclavicular or inguinal adenopathy Cardiovascular  Pulse normal rate, regularity and rhythm. S1 and S2 normal.  Lungs  Clear to auscultation bilateraly, without wheezes/crackles/rhonchi. Good air  movement.  Skin  No rash/lesions/breakdown  Psychiatry  Alert and oriented to person, place, and time  Abdomen  Normoactive bowel sounds, abdomen soft, and thin without evidence of hernia. Soft incisions. Tender suprapubic region with fullness. Back No CVA tenderness Genito Urinary  Vulva/vagina: Normal external female genitalia.   No lesions. No discharge  Bladder/urethra:  No lesions or masses, well supported bladder  Vagina: scant dark red blood at top of vagina.Cuff in tact, atrophy present. No lesions or palpable masses. Vaginal mucosa some what friable.  Rectal  No palpable masses Extremities  No bilateral cyanosis, clubbing or edema.  Thereasa Solo, MD  02/04/2019, 11:49 AM

## 2019-02-05 ENCOUNTER — Telehealth: Payer: Self-pay | Admitting: *Deleted

## 2019-02-05 LAB — URINE CULTURE: Culture: <10000 — AB

## 2019-02-05 NOTE — Telephone Encounter (Signed)
Patient informed of urine culture results, no infection noted.

## 2019-02-05 NOTE — Telephone Encounter (Signed)
Faxed CT scan to [facility]

## 2019-02-08 ENCOUNTER — Telehealth: Payer: Self-pay | Admitting: *Deleted

## 2019-02-08 NOTE — Telephone Encounter (Signed)
Called the patient and was given the appt date/time for her CT scan at [place] on 1/19 at 4pm

## 2019-02-10 ENCOUNTER — Ambulatory Visit (HOSPITAL_COMMUNITY): Payer: Managed Care, Other (non HMO)

## 2019-02-11 ENCOUNTER — Other Ambulatory Visit: Payer: Self-pay | Admitting: Gynecologic Oncology

## 2019-02-11 ENCOUNTER — Ambulatory Visit
Admission: RE | Admit: 2019-02-11 | Discharge: 2019-02-11 | Disposition: A | Discharge status: Home / Self Care | Payer: Self-pay | Source: Ambulatory Visit | Attending: Gynecologic Oncology | Admitting: Gynecologic Oncology

## 2019-02-11 DIAGNOSIS — C538 Malignant neoplasm of overlapping sites of cervix uteri: Secondary | ICD-10-CM

## 2019-02-12 ENCOUNTER — Telehealth: Payer: Self-pay | Admitting: Oncology

## 2019-02-12 ENCOUNTER — Ambulatory Visit
Admission: RE | Admit: 2019-02-12 | Discharge: 2019-02-12 | Disposition: A | Discharge status: Home / Self Care | Payer: Self-pay | Source: Ambulatory Visit | Attending: Gynecologic Oncology | Admitting: Gynecologic Oncology

## 2019-02-12 ENCOUNTER — Other Ambulatory Visit: Payer: Self-pay

## 2019-02-12 DIAGNOSIS — C538 Malignant neoplasm of overlapping sites of cervix uteri: Secondary | ICD-10-CM

## 2019-02-12 NOTE — Telephone Encounter (Signed)
Notified [place] that we are getting the images powershared from [facility] and that Dr. Denman George will need to review them.  We will give her a call when Dr. Denman George has reviewed them.

## 2019-02-12 NOTE — Telephone Encounter (Signed)
Jordan Waters said she saw the outside film order in [place] and is wondering if her results are back yet.

## 2019-02-15 ENCOUNTER — Telehealth: Payer: Self-pay

## 2019-02-16 ENCOUNTER — Telehealth: Payer: Self-pay | Admitting: *Deleted

## 2019-02-16 ENCOUNTER — Other Ambulatory Visit: Payer: Self-pay | Admitting: Gynecologic Oncology

## 2019-02-16 DIAGNOSIS — C538 Malignant neoplasm of overlapping sites of cervix uteri: Secondary | ICD-10-CM

## 2019-02-16 DIAGNOSIS — N898 Other specified noninflammatory disorders of vagina: Secondary | ICD-10-CM

## 2019-02-16 NOTE — Telephone Encounter (Signed)
CT results reviewed with patient. Pt aware that Dr. Denman George would like a CT scheduled in 3 mos, prior to pt's Appointment w/ Dr. Sondra Come, to monitor findings.

## 2019-02-16 NOTE — Progress Notes (Signed)
Repeat CT ordered to follow up on vaginal cuff soft tissue area noted on prev CT and adrenal nodule.

## 2019-02-17 NOTE — Telephone Encounter (Signed)
ENCOUNTER OPENED IN ERROR

## 2019-02-19 ENCOUNTER — Telehealth: Payer: Self-pay | Admitting: *Deleted

## 2019-02-19 NOTE — Telephone Encounter (Signed)
Called and scheduled the CT chest at [facility] imaging in [place] for 05/12/19 at 10 am. Faxed signed order, demographics and insurance card to center.   Called and spoke with the patient, gave the appt date/time

## 2019-02-25 ENCOUNTER — Other Ambulatory Visit: Payer: Self-pay | Admitting: Gynecologic Oncology

## 2019-02-25 ENCOUNTER — Telehealth: Payer: Self-pay | Admitting: *Deleted

## 2019-02-25 DIAGNOSIS — N898 Other specified noninflammatory disorders of vagina: Secondary | ICD-10-CM

## 2019-02-25 DIAGNOSIS — C538 Malignant neoplasm of overlapping sites of cervix uteri: Secondary | ICD-10-CM

## 2019-02-25 NOTE — Telephone Encounter (Signed)
Pt called reporting cramping and bright red bleeding since last night. MRI ordered per M. Cross NP. MRI scheduled and Pt notified of time and date of MRI

## 2019-03-02 ENCOUNTER — Encounter: Payer: Self-pay | Admitting: Gynecologic Oncology

## 2019-03-02 NOTE — Progress Notes (Signed)
25 minutes spent with patient's insurance company to give additional clinic information for the MRI of the pelvis.  It is being reconsidered and we should be notified via phone or fax. Will continue to follow and will perform a peer to peer if needed.

## 2019-03-04 ENCOUNTER — Other Ambulatory Visit: Payer: Self-pay | Admitting: *Deleted

## 2019-03-04 ENCOUNTER — Telehealth: Payer: Self-pay

## 2019-03-04 MED ORDER — DULOXETINE HCL 60 MG PO CPEP: 60.0000 mg | ORAL_CAPSULE | Freq: Every day | ORAL | 11 refills | Status: DC

## 2019-03-04 MED ORDER — GABAPENTIN 100 MG PO CAPS: 200.0000 mg | ORAL_CAPSULE | Freq: Three times a day (TID) | ORAL | 11 refills | Status: DC

## 2019-03-04 NOTE — Telephone Encounter (Signed)
Called [facility] and LVM asking that gabapentin and duloxetine be cx that they have on file. Pt wants to fill with different pharmacy and we will be sending in new rx's. Provided office # if they have further questions. Escribed rx's to [facility] per pt request.

## 2019-03-04 NOTE — Telephone Encounter (Signed)
1) Medication(s) Requested (by name): DULoxetine (CYMBALTA) 60 MG capsule - out of this med gabapentin (NEURONTIN) 100 MG capsule   2) Pharmacy of Choice: [facility] on [address]

## 2019-03-04 NOTE — Progress Notes (Signed)
Called [facility] and LVM asking that gabapentin and duloxetine be cx that they have on file. Pt wants to fill with different pharmacy and we will be sending in new rx's. Provided office # if they have further questions. Escribed rx's to [facility] per pt request.

## 2019-03-08 ENCOUNTER — Other Ambulatory Visit: Payer: Self-pay

## 2019-03-08 ENCOUNTER — Ambulatory Visit (HOSPITAL_COMMUNITY)
Admission: RE | Admit: 2019-03-08 | Discharge: 2019-03-08 | Disposition: A | Discharge status: Home / Self Care | Payer: Managed Care, Other (non HMO) | Source: Ambulatory Visit | Attending: Gynecologic Oncology | Admitting: Gynecologic Oncology

## 2019-03-08 DIAGNOSIS — N898 Other specified noninflammatory disorders of vagina: Secondary | ICD-10-CM | POA: Diagnosis present

## 2019-03-08 DIAGNOSIS — C538 Malignant neoplasm of overlapping sites of cervix uteri: Secondary | ICD-10-CM

## 2019-03-08 IMAGING — MR MR PELVIS WO/W CM
10 of 15 series · 30 of 48 positions shown · IV contrast (gadavist)
Comparison: Outside CT abdomen/pelvis dated [DATE]. [DATE]
MRI pelvis.

CLINICAL DATA: Stage IB3 cervical adenocarcinoma status post
neoadjuvant chemoradiation therapy completed [DATE] and
extrafascial hysterectomy [DATE], now with new vaginal bleeding.

EXAM:
MRI PELVIS WITHOUT AND WITH CONTRAST
TECHNIQUE: Multiplanar multisequence MR imaging of the pelvis was performed
both before and after administration of intravenous contrast.
CONTRAST:  5mL GADAVIST GADOBUTROL 1 MMOL/ML IV SOLN

[Series 1: 3 plane loc · axial · 3.0mm · 1.56mm/px · z∈[-18,+200]mm · 3 of 27 slices shown]
[im 1/27]
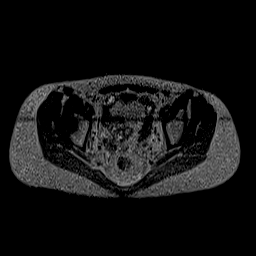
[im 14/27]
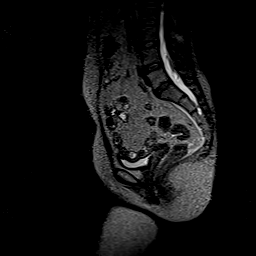
[im 27/27]
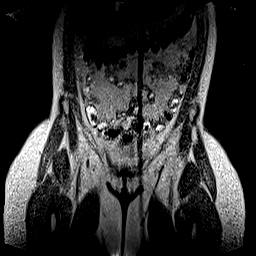

[Series 2: T2 · coronal · 6.0mm · 1.56mm/px · 3 of 30 slices shown (1 of 3)]
[im 1/30]
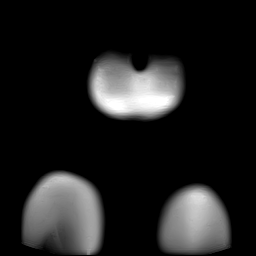
[im 15/30]
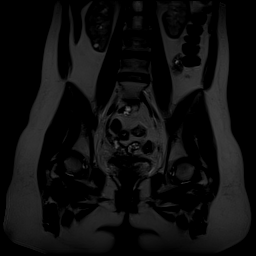
[im 30/30]
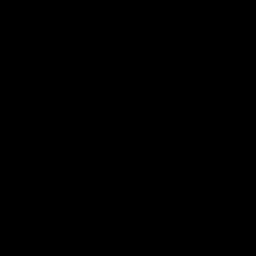

[Series 3: T2 · axial · 5.0mm · 0.51mm/px · z∈[-145,+65]mm · 3 of 36 slices shown (2 of 3)]
[im 1/36]
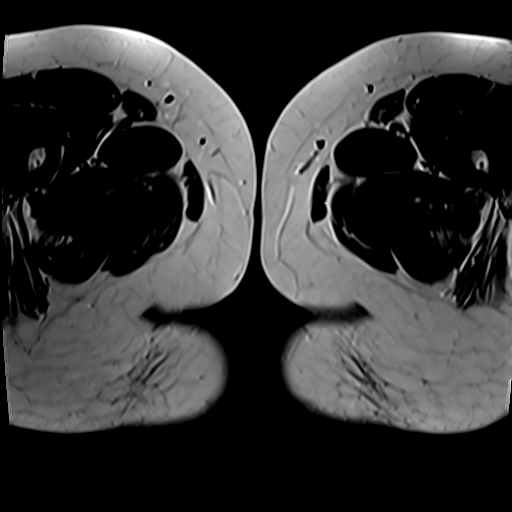
[im 18/36]
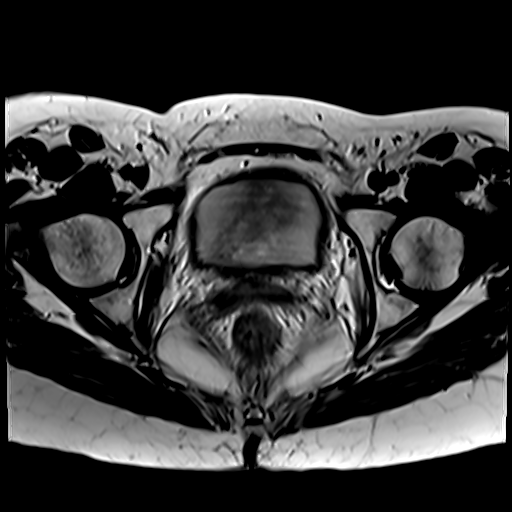
[im 36/36]
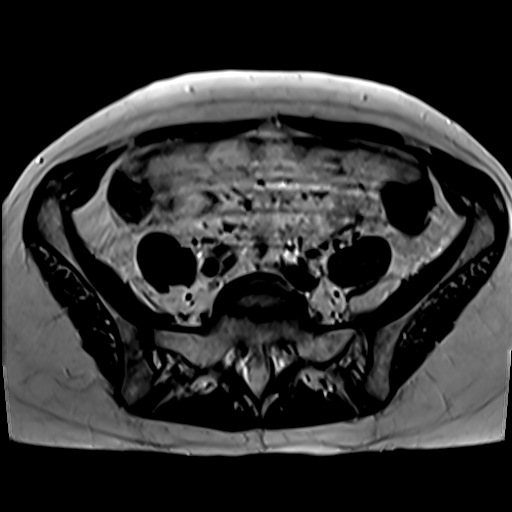

[Series 4: T2 fat-sat · axial · 5.0mm · 0.51mm/px · z∈[-139,+59]mm · 2 of 34 slices shown]
[im 1/34]
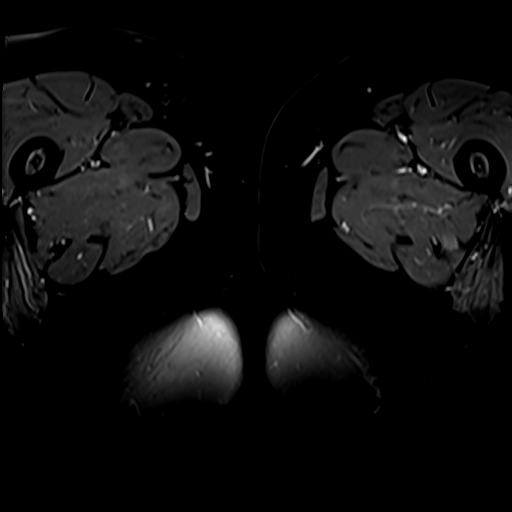
[im 34/34]
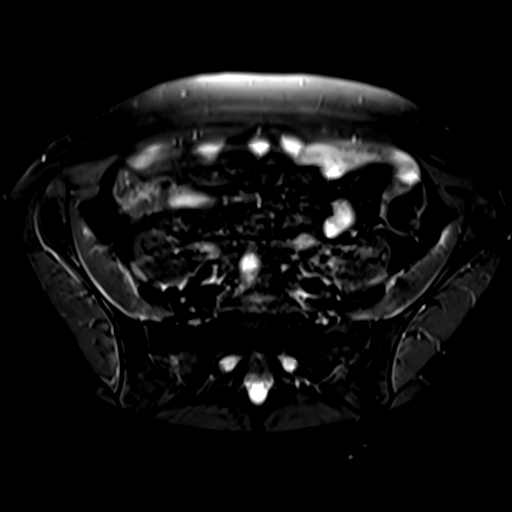

[Series 5: T2 · sagittal · 5.0mm · 0.55mm/px · 3 of 40 slices shown (3 of 3)]
[im 1/40]
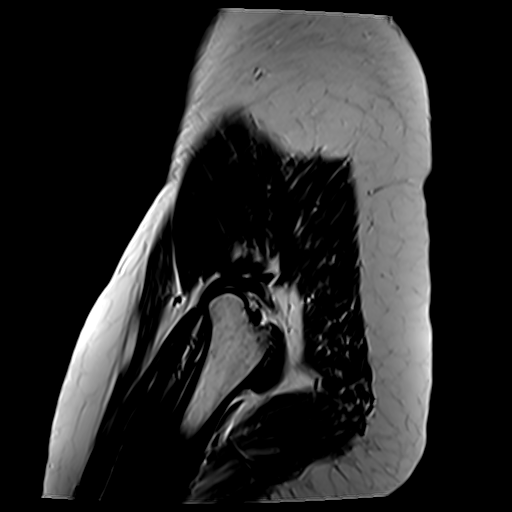
[im 20/40]
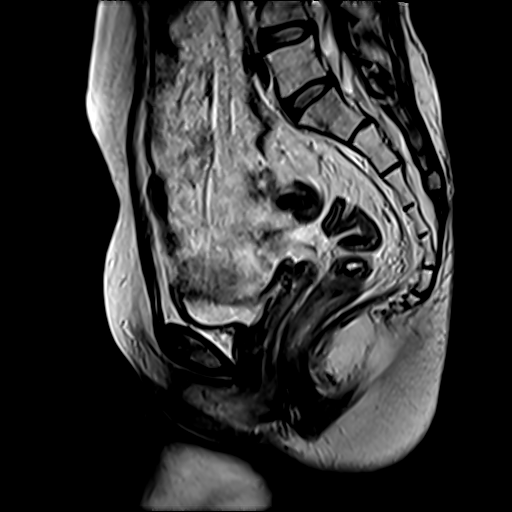
[im 40/40]
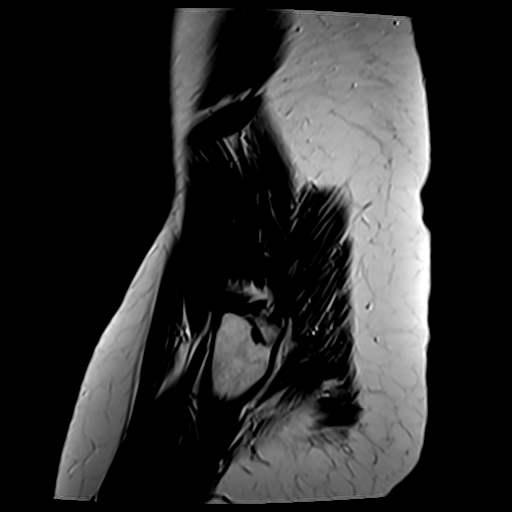

[Series 6: T1 · axial · 5.0mm · 0.51mm/px · z∈[-145,+65]mm · 3 of 36 slices shown (1 of 4)]
[im 1/36]
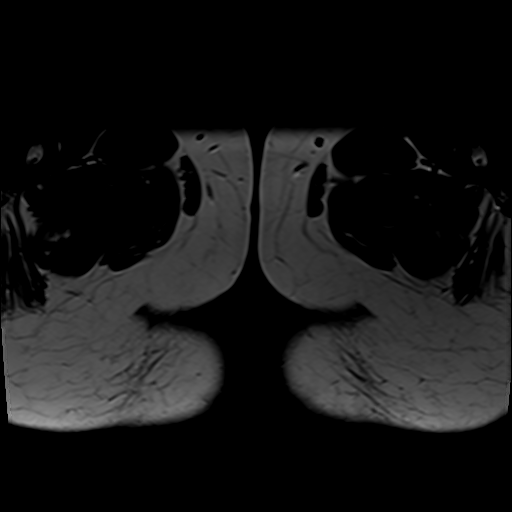
[im 18/36]
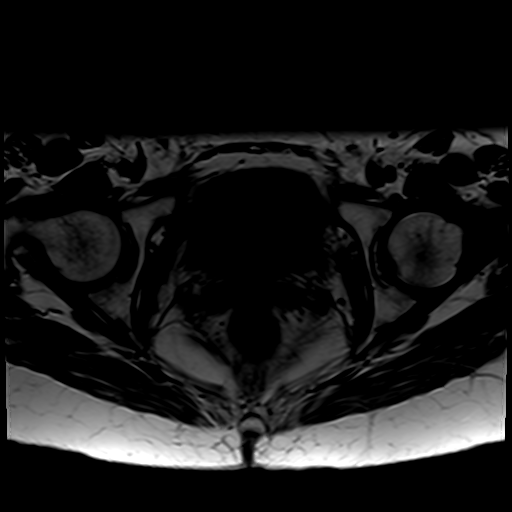
[im 36/36]
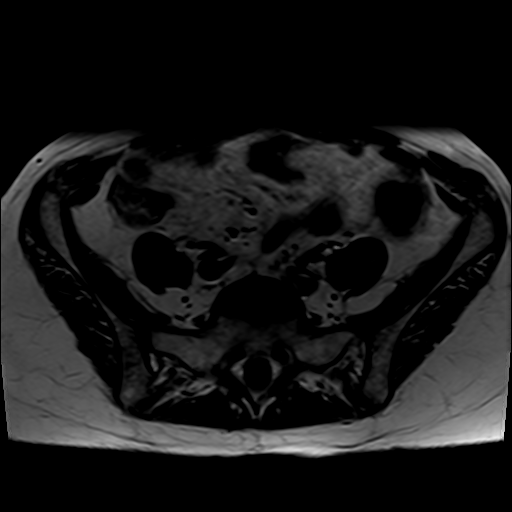

[Series 7: T1 fat-sat · axial · non-contrast · 5.0mm · 0.51mm/px · z∈[-152,+88]mm · 3 of 41 slices shown]
[im 1/41]
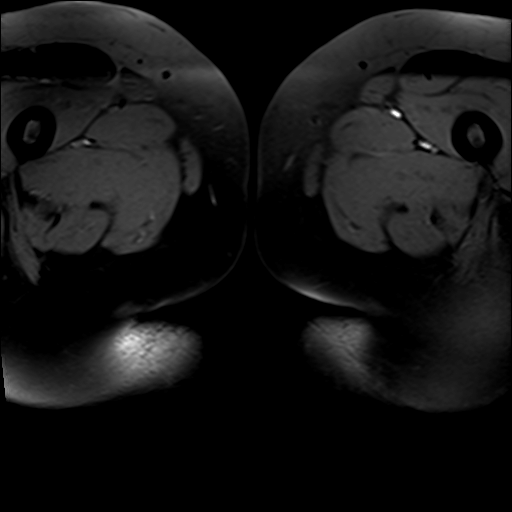
[im 21/41]
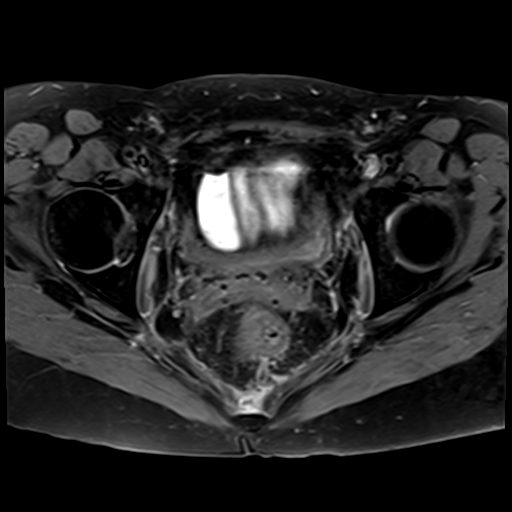
[im 41/41]
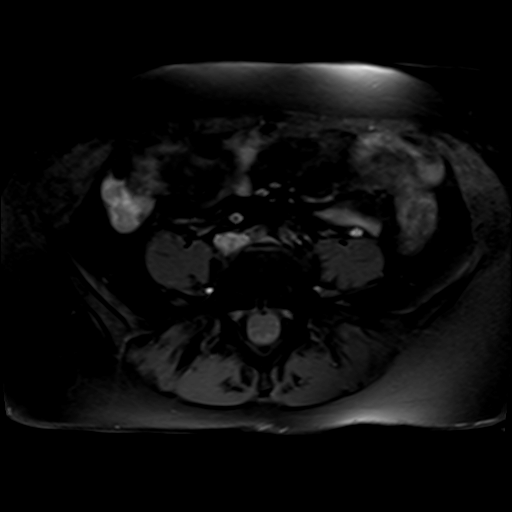

[Series 9: T1 · sagittal · non-contrast · 4.0mm · 0.97mm/px · 4 of 60 slices shown (2 of 4)]
[im 1/60]
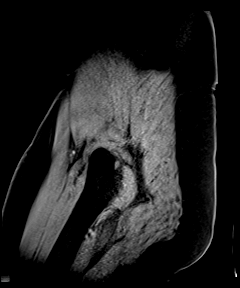
[im 20/60]
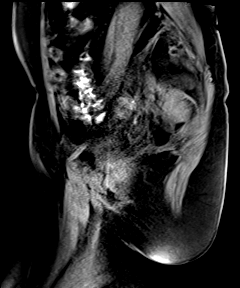
[im 40/60]
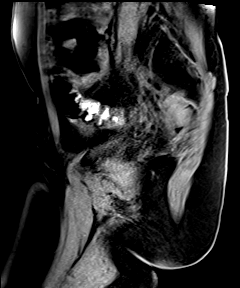
[im 60/60]
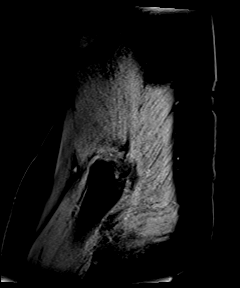

[Series 10: T1 · sagittal · non-contrast · 4.0mm · 0.97mm/px · 4 of 60 slices shown (3 of 4)]
[im 1/60]
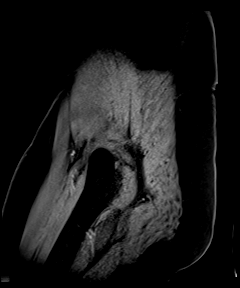
[im 20/60]
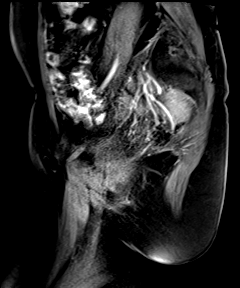
[im 40/60]
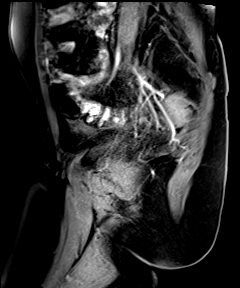
[im 60/60]
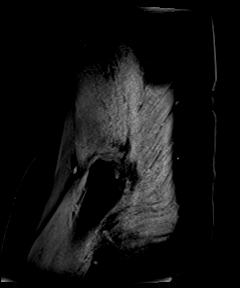

[Series 11: T1 · sagittal · 4.0mm · 0.97mm/px · 2 of 60 slices shown (4 of 4)]
[im 1/60]
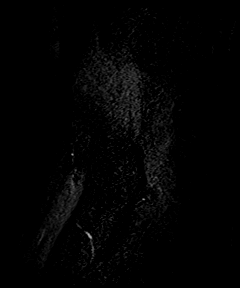
[im 20/60]
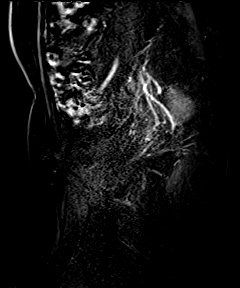

[30 of 48 positions shown; findings below may reference images not displayed]

FINDINGS: Urinary Tract:  Normal nondistended bladder.  Normal urethra.

Bowel: Visualized small and large bowel are normal caliber with no
definite bowel wall thickening.

Vascular/Lymphatic: No pathologically enlarged lymph nodes in the
pelvis. No acute vascular abnormality.

Reproductive: Patient is status post hysterectomy and bilateral
salpingo-oophorectomy. There is a small 1.2 x 1.2 x 1.2 cm focus at
the left vaginal cuff (series 17/image 20 and series 16/image 32)
with peripheral enhancement and central nonenhancement and
intermediate T1 and T2 signal intensity, not appreciably changed
since [DATE] outside CT pelvis study. Otherwise normal
hysterectomy margin. No adnexal masses.

Other: No abnormal free fluid in the pelvis. No focal pelvic fluid
collection.

Musculoskeletal: No aggressive appearing focal osseous lesions.
IMPRESSION: 1. Small 1.2 x 1.2 x 1.2 cm equivocal focus at the left vaginal cuff
with peripheral enhancement, not appreciably changed since
[DATE] outside CT study. While postsurgical/post treatment
change is favored, early recurrence cannot be entirely excluded and
continued imaging surveillance is warranted.
2. No lymphadenopathy or other potential findings of metastatic
disease in the pelvis. No adnexal masses.

## 2019-03-08 MED ORDER — GADOBUTROL 1 MMOL/ML IV SOLN
5.0000 mL | Freq: Once | INTRAVENOUS | Status: AC | PRN
  Administered 2019-03-08: 5 mL via INTRAVENOUS

## 2019-03-10 ENCOUNTER — Telehealth: Payer: Self-pay

## 2019-03-10 ENCOUNTER — Other Ambulatory Visit: Payer: Self-pay | Admitting: Gynecologic Oncology

## 2019-03-10 DIAGNOSIS — N898 Other specified noninflammatory disorders of vagina: Secondary | ICD-10-CM

## 2019-03-10 DIAGNOSIS — C538 Malignant neoplasm of overlapping sites of cervix uteri: Secondary | ICD-10-CM

## 2019-03-10 NOTE — Telephone Encounter (Signed)
Pt is scheduled for a CT A/P at [facility] on April 21,at 1000. This was already scheduled by gyn oncology. Does this need to be cancelled?

## 2019-03-10 NOTE — Telephone Encounter (Signed)
Told Jordan Waters that the MRI shows the the area seen previously at the left vaginal cuff has not appreciably changed since CT scan 02-09-19 per Jordan John, NP. Continued imaging surveillance is recommended by radiology. Dr. Denman Waters  is in agreement with this plan. She is ordering a repeat MRI at [facility] in 3 months with a follow up visit. (Told pt that her insurance may insist on a CT A/P scan prior to another MRI of the pelvis. Pt is able to have scan on a Monday, Tuesday or in the evening. Will call her with appointments when scheduled.

## 2019-03-11 NOTE — Telephone Encounter (Signed)
Told Ms Huckabay that Jordan John, NP stated that she can cancel the CT at [facility] for 05-12-19. A CT Scan can be rescheduled if the MRI for May is denied by her insurance. Our scheduler Sharyn Lull will call her with appointment. Pt verbalized understanding.

## 2019-03-15 ENCOUNTER — Telehealth: Payer: Self-pay | Admitting: *Deleted

## 2019-03-15 NOTE — Telephone Encounter (Signed)
Scheduled the patient for a MRI in may; also scheduled the patient for a follow up visit. Called and left the patient a message to call the office back

## 2019-03-15 NOTE — Telephone Encounter (Signed)
Attempted to return the patient's call, left a message that her appts are in my chart

## 2019-05-05 NOTE — Progress Notes (Signed)
Radiation Oncology         [phone]) [phone] ________________________________  Name: Jordan Waters MRN: [id number]  Date: 05/06/2019  DOB: 29-Nov-1975  Follow-Up Visit Note  CC: Esaw Grandchild, NP  Danford, Berna Spare, NP    ICD-10-CM   1. Malignant neoplasm of overlapping sites of cervix ([place])  C53.8     Diagnosis: Stage IB-3 adenocarcinoma of the cervix  Interval Since Last Radiation: Seven months and three weeks.  Radiation Treatment Dates: 07/31/2018 through 09/08/2018 Site Technique Total Dose Dose per Fx Completed Fx Beam Energies  Pelvis: Cervix_pelvis 3D 45/45 1.8 25/25 15X   09/15/2018: Brachytherapy/HDR, 45 tandem/ring system, 65mm tandem / 5.5 Gy in 1 fraction  Narrative:  The patient returns today for follow-up. At the end of her treatment, the patient was to follow-up with radiation oncology following anticipated extrafascial hysterectomy.   The patient was seen by Dr. Denman George on 10/23/2018, during which time they discussed proceeding with extrafascial total hysterectomy with BSO six weeks post completion of radiation therapy.   The patient underwent a robotic assisted total hysterectomy with bilateral salpingo-oophorectomy on 10/29/2018 performed by Dr. Denman George. Pathology from the procedure showed focal residual adenocarcinoma status post neoadjuvant therapy and previous treatment-related changes of the cervix. Uterus showed inactive endometrium and leiomyomata without hyperplasia or malignancy. Bilateral ovaries and fallopian tubes were benign. Procedure was successful and the patient recovered well.  The patient followed up with Dr. Denman George on 02/04/2019 for vaginal spotting. On examination, there was no evidence of recurrence. Vaginal spotting was suspected to be secondary to vaginal atrophy and radiation changes.  CT of abdomen/pelvis on 02/09/2019 at [facility] showed a lobulated peripherally enhancing soft tissue adjacent to the vaginal cuff, which may  be treatment related, though some element of residual or recurrent disease was not entirely excluded. An indeterminate 1.3 cm left adrenal nodule was also seen.  MRI of pelvis on 03/08/2019 showed a small 1.2 x 1.2 x 1.2 cm equivocal focus at the left vaginal cuff with peripheral enhancement, no appreciably changed since 02/09/2019 outside CT study. While postsurgical/post treatment change was favored, early recurrence could not be entirely excluded and continued imaging surveillance was warranted. No lymphadenopathy or other potential findings of metastatic disease were seen in the pelvis. No adnexal masses.  Following the above studies, Dr. Denman George agreed with continued imaging surveillance. Repeat MRI will be ordered for next month.  On review of systems, she reports no complaints. She denies pain, dysuria, hematuria, vaginal bleeding/discharge, rectal bleeding, diarrhea, constipation, abdominal bloating, nausea, and vomiting.  She has been unable to resume intercourse since radiation and surgery.  ALLERGIES:  has No Known Allergies.  Meds: Current Outpatient Medications  Medication Sig Dispense Refill  . Ascorbic Acid (VITAMIN C) 100 MG tablet Take 100 mg by mouth daily.    Marland Kitchen conjugated estrogens (PREMARIN) vaginal cream Place 1 Applicatorful vaginally 3 (three) times a week. 42.5 g 12  . DULoxetine (CYMBALTA) 60 MG capsule Take 1 capsule (60 mg total) by mouth daily. 30 capsule 11  . ELDERBERRY PO Take 2 each by mouth daily.    Marland Kitchen gabapentin (NEURONTIN) 100 MG capsule Take 2 capsules (200 mg total) by mouth 3 (three) times daily. Final refill 180 capsule 11  . Multiple Vitamin (MULTIVITAMIN WITH MINERALS) TABS tablet Take 1 tablet by mouth daily.    . Multiple Vitamins-Minerals (HAIR SKIN AND NAILS FORMULA PO) Take 2 capsules by mouth daily. Plus Biotin     No current  facility-administered medications for this encounter.    Physical Findings: The patient is in no acute distress. Patient is  alert and oriented.  height is 5\' 1"  (1.549 m) and weight is 126 lb 4 oz (57.3 kg). Her temporal temperature is 98.5 F (36.9 C). Her blood pressure is 104/62 and her pulse is 78. Her respiration is 18 and oxygen saturation is 100%.   Lungs are clear to auscultation bilaterally. Heart has regular rate and rhythm. No palpable cervical, supraclavicular, or axillary adenopathy. Abdomen soft, non-tender, normal bowel sounds. On pelvic examination the external genitalia were unremarkable. A speculum exam was performed. There are no mucosal lesions noted in the vaginal vault. On bimanual and rectovaginal examination there were no pelvic masses appreciated.  Some radiation changes noted in the proximal vagina.  Lab Findings: Lab Results  Component Value Date   WBC 3.4 (L) 11/30/2018   HGB 12.3 11/30/2018   HCT 37.1 11/30/2018   MCV 97.6 11/30/2018   PLT 235 11/30/2018    Radiographic Findings: No results found.  Impression: Stage IB-3 adenocarcinoma of the cervix  No evidence of recurrence on clinical exam today.   Plan: Patient will proceed with the MRI next month and then follow-up with Dr. Denman George soon afterward.  Today the patient was given several different diameter vaginal dilators to try and instructions on their use in light of her pelvic radiation therapy and brachytherapy treatments.  She was  also started on vaginal estrogen which may help with her intercourse issues.  Patient will follow up in radiation oncology in August.  ____________________________________   Blair Promise, PhD, MD  This document serves as a record of services personally performed by Gery Pray, MD. It was created on his behalf by Clerance Lav, a trained medical scribe. The creation of this record is based on the scribe's personal observations and the provider's statements to them. This document has been checked and approved by the attending provider.

## 2019-05-06 ENCOUNTER — Encounter: Payer: Self-pay | Admitting: Radiation Oncology

## 2019-05-06 ENCOUNTER — Ambulatory Visit
Admission: RE | Admit: 2019-05-06 | Discharge: 2019-05-06 | Disposition: A | Discharge status: Home / Self Care | Payer: Managed Care, Other (non HMO) | Source: Ambulatory Visit | Attending: Radiation Oncology | Admitting: Radiation Oncology

## 2019-05-06 ENCOUNTER — Other Ambulatory Visit: Payer: Self-pay

## 2019-05-06 VITALS — BP 104/62 | HR 78 | Temp 98.5°F | Resp 18 | Ht 61.0 in | Wt 126.2 lb

## 2019-05-06 DIAGNOSIS — Z8542 Personal history of malignant neoplasm of other parts of uterus: Secondary | ICD-10-CM | POA: Diagnosis present

## 2019-05-06 DIAGNOSIS — Z79899 Other long term (current) drug therapy: Secondary | ICD-10-CM | POA: Diagnosis not present

## 2019-05-06 DIAGNOSIS — Z90710 Acquired absence of both cervix and uterus: Secondary | ICD-10-CM | POA: Insufficient documentation

## 2019-05-06 DIAGNOSIS — C538 Malignant neoplasm of overlapping sites of cervix uteri: Secondary | ICD-10-CM

## 2019-05-06 DIAGNOSIS — Z90722 Acquired absence of ovaries, bilateral: Secondary | ICD-10-CM | POA: Insufficient documentation

## 2019-05-06 NOTE — Progress Notes (Signed)
Pt presents today for f/u with Dr. Sondra Come. Pt denies c/o pain. Pt denies dysuria/hematuria. Pt denies vaginal bleeding/discharge. Pt denies rectal bleeding, diarrhea/constipation. Pt denies abdominal bloating, N/V.  BP 104/62 (BP Location: Left Arm, Patient Position: Sitting)   Pulse 78   Temp 98.5 F (36.9 C) (Temporal)   Resp 18   Ht 5\' 1"  (1.549 m)   Wt 126 lb 4 oz (57.3 kg)   SpO2 100%   BMI 23.85 kg/m   Wt Readings from Last 3 Encounters:  05/06/19 126 lb 4 oz (57.3 kg)  02/04/19 119 lb (54 kg)  12/29/18 122 lb 3.2 oz (55.4 kg)   Loma Sousa, RN BSN

## 2019-05-06 NOTE — Patient Instructions (Signed)
Coronavirus (COVID-19) Are you at risk?  Are you at risk for the Coronavirus (COVID-19)?  To be considered HIGH RISK for Coronavirus (COVID-19), you have to meet the following criteria:  . Traveled to [place], [place], [place], [place] or [place]; or in the [place] to [place], [place], [place], or [place]; and have fever, cough, and shortness of breath within the last 2 weeks of travel OR . Been in close contact with a person diagnosed with COVID-19 within the last 2 weeks and have fever, cough, and shortness of breath . IF YOU DO NOT MEET THESE CRITERIA, YOU ARE CONSIDERED LOW RISK FOR COVID-19.  What to do if you are HIGH RISK for COVID-19?  . If you are having a medical emergency, call 911. . Seek medical care right away. Before you go to a doctor's office, urgent care or emergency department, call ahead and tell them about your recent travel, contact with someone diagnosed with COVID-19, and your symptoms. You should receive instructions from your physician's office regarding next steps of care.  . When you arrive at healthcare provider, tell the healthcare staff immediately you have returned from visiting [place], [place], [place], [place] or [place]; or traveled in the [place] to [place], [place], [place], or [place]; in the last two weeks or you have been in close contact with a person diagnosed with COVID-19 in the last 2 weeks.   . Tell the health care staff about your symptoms: fever, cough and shortness of breath. . After you have been seen by a medical provider, you will be either: o Tested for (COVID-19) and discharged home on quarantine except to seek medical care if symptoms worsen, and asked to  - Stay home and avoid contact with others until you get your results (4-5 days)  - Avoid travel on public transportation if possible (such as bus, train, or airplane) or o Sent to the Emergency Department by EMS for evaluation, COVID-19 testing, and possible  admission depending on your condition and test results.  What to do if you are LOW RISK for COVID-19?  Reduce your risk of any infection by using the same precautions used for avoiding the common cold or flu:  . Wash your hands often with soap and warm water for at least 20 seconds.  If soap and water are not readily available, use an alcohol-based hand sanitizer with at least 60% alcohol.  . If coughing or sneezing, cover your mouth and nose by coughing or sneezing into the elbow areas of your shirt or coat, into a tissue or into your sleeve (not your hands). . Avoid shaking hands with others and consider head nods or verbal greetings only. . Avoid touching your eyes, nose, or mouth with unwashed hands.  . Avoid close contact with people who are sick. . Avoid places or events with large numbers of people in one location, like concerts or sporting events. . Carefully consider travel plans you have or are making. . If you are planning any travel outside or inside the [place], visit the CDC's Travelers' Health webpage for the latest health notices. . If you have some symptoms but not all symptoms, continue to monitor at home and seek medical attention if your symptoms worsen. . If you are having a medical emergency, call 911.   ADDITIONAL HEALTHCARE OPTIONS FOR PATIENTS  [facility] / e-Visit: [email or web address]         [facility]: [phone]     Urgent Care: [phone]                   [facility]: [phone]   

## 2019-06-04 ENCOUNTER — Telehealth: Payer: Self-pay | Admitting: *Deleted

## 2019-06-04 NOTE — Telephone Encounter (Signed)
Patient scan not authorized by insurance, it's still pending. Explained that we are waiting to hear from them, hoping on Monday. MD appt cancelled

## 2019-06-07 ENCOUNTER — Inpatient Hospital Stay: Payer: Managed Care, Other (non HMO) | Admitting: Gynecologic Oncology

## 2019-06-07 ENCOUNTER — Ambulatory Visit (HOSPITAL_COMMUNITY): Payer: Managed Care, Other (non HMO)

## 2019-06-07 ENCOUNTER — Telehealth: Payer: Self-pay | Admitting: *Deleted

## 2019-06-07 DIAGNOSIS — C538 Malignant neoplasm of overlapping sites of cervix uteri: Secondary | ICD-10-CM

## 2019-06-07 NOTE — Telephone Encounter (Signed)
MRI approved; called and scheduled appt for 5/289 with MD visit on 6/3. Called and left the patient a message

## 2019-06-07 NOTE — Telephone Encounter (Signed)
Nurse from patient's insurance called and requested the order be faxed to [facility]

## 2019-06-08 ENCOUNTER — Other Ambulatory Visit: Payer: Self-pay

## 2019-06-08 ENCOUNTER — Encounter: Payer: Self-pay | Admitting: Neurology

## 2019-06-08 ENCOUNTER — Ambulatory Visit (INDEPENDENT_AMBULATORY_CARE_PROVIDER_SITE_OTHER): Payer: Managed Care, Other (non HMO) | Admitting: Neurology

## 2019-06-08 VITALS — BP 109/75 | HR 81 | Ht 61.0 in | Wt 123.0 lb

## 2019-06-08 DIAGNOSIS — M542 Cervicalgia: Secondary | ICD-10-CM

## 2019-06-08 DIAGNOSIS — IMO0002 Reserved for concepts with insufficient information to code with codable children: Secondary | ICD-10-CM

## 2019-06-08 DIAGNOSIS — G43709 Chronic migraine without aura, not intractable, without status migrainosus: Secondary | ICD-10-CM | POA: Diagnosis not present

## 2019-06-08 DIAGNOSIS — R208 Other disturbances of skin sensation: Secondary | ICD-10-CM

## 2019-06-08 MED ORDER — AIMOVIG 70 MG/ML ~~LOC~~ SOAJ: SUBCUTANEOUS | 4 refills | Status: DC

## 2019-06-08 NOTE — Progress Notes (Signed)
[facility]  PATIENT: Jordan Waters DOB: 1975/02/02  REFERRING DOCTOR OR PCP:  Jordan Marble, NP SOURCE: patient, notes from ED (04/04/2018) and PCP, imaging reports, CT images personally reviewed.  _________________________________   HISTORICAL  CHIEF COMPLAINT:  Chief Complaint  Patient presents with  . Follow-up    RM 13 with spouse.  Last seen 01/04/19. Here to f/u on migraines, fatigue, dysesthesias. Takes gabapentin, duloxetine.  Feels electric sparks going through fingers/toes/head. Has had a constant headache for the last year. Has not had any relief. Severity about the same as last visit.     Update 06/08/2019: She is reporting headaches are present daily.   They are bilateral from neck to vertex.   She has mild nausea.   She finds ginger tea helps.   She has photophobia.   She feels the daily headaches are different than her migraines.      Duloxetine and gabapentin help the burning sensation some but does not eliminate the pain.   Flexeril has not helped.     CT of head was normal 04/04/2018.   MRI cervical spine 06/23/2018 was normal for age.   Lumbar puncture showed normal CSF.      From 01/04/2019: At the initial visit, lumbar puncture was performed due to the headaches, other symptoms and her concern for MS.  The results of the CSF was normal.  We also checked an MRI of the cervical spine.  The spinal cord was normal.  She had mild degenerative changes.  We prescribed duloxetine at the last visit and she felt the combination with gabapentin had helped.  Currently, she is reporting electrical sensations in her shoulders and hips.   Myalgias worsened after she stopped the duloxetine.  She feels the neck is tight and painful.       She is reporting headaches that are bilateral..    She feels these are different than her typical migraine headaches and she does not have photophobia or nausea..  She also notes some fatigue.  Gabapentin was recently  restarted at 100 mg po tid (dose she was on in past).    She is also on Naprosyn.    She was diagnosed with uterine cancer and had a hysterectomy 10/2018.     She has had chemo and radiation as well.      From initial evaluation 04/08/2018:  I had the pleasure seeing a patient, Jordan Waters, at [facility] for neurologic consultation regarding her headaches.  She is a 44 year old woman who has had a daily headache since January.    She has a tight sensation in her head and neck. The neck pain radiates down the spine.  The pain is symmetric.   She has had nausea daily but no vomiting.   She notes photophobia and phonophobia.    Moving her head does not greatly alter the intensity.     She notes pain going into all 4 limbs and she feels all are clumsy.   She has been dropping items.   Her speech is slurred at times.    She denies bladder issues but had incontinence in the office.  She has seen primary care/urgent care and was diagnosed with sinusitis.  She was prescribed amoxicillin and told to take acetaminophen initially.    A dentist noted cracked wisdom teeth and these were pulled without benefit.    Ibuprofen was also prescribed without benefit.   She also saw ENT who saw no evidence of sinusitis.  She went back to urgent care and was prescribed prednisone and Flexeril without benefit.    She notes diplopia and night sweats 04/03/2018.   She presented to the emergency room 04/04/2018 due to the persistant headache.  She was prescribed NSAIDs.    Yesterday, gabapentin was prescribed.      She has a long history of migraines but they have never persisted.   Triptans gave her nightmares so she has not taken recently.      She reports depression and anxiety, worse since symptoms started.    CT scan of the head and cervical spine was performed and was read as normal.  I personally reviewed the images.  The CT scan of the head was normal.  The CT scan of the cervical spine showed no  significant degenerative changes.  REVIEW OF SYSTEMS: Constitutional: No fevers, chills,  or change in appetite.  She reports night sweats. Eyes: No visual changes, double vision, eye pain Ear, nose and throat: No hearing loss, ear pain, nasal congestion, sore throat Cardiovascular: No chest pain, palpitations Respiratory: No shortness of breath at rest or with exertion.   No wheezes GastrointestinaI: No nausea, vomiting, diarrhea, abdominal pain, fecal incontinence Genitourinary: No dysuria, urinary retention or frequency.  No nocturia.  Although she does not have a history of incontinence, she did have some doing the examination today. Musculoskeletal:See above. Integumentary: No rash, pruritus, skin lesions Neurological: as above Psychiatric: She has depression and anxiety. Endocrine: No palpitations, diaphoresis, change in appetite, change in weigh or increased thirst Hematologic/Lymphatic: No anemia, purpura, petechiae. Allergic/Immunologic: No itchy/runny eyes, nasal congestion, recent allergic reactions, rashes  ALLERGIES: No Known Allergies  HOME MEDICATIONS:  Current Outpatient Medications:  .  Ascorbic Acid (VITAMIN C) 100 MG tablet, Take 100 mg by mouth daily., Disp: , Rfl:  .  conjugated estrogens (PREMARIN) vaginal cream, Place 1 Applicatorful vaginally 3 (three) times a week., Disp: 42.5 g, Rfl: 12 .  DULoxetine (CYMBALTA) 60 MG capsule, Take 1 capsule (60 mg total) by mouth daily., Disp: 30 capsule, Rfl: 11 .  ELDERBERRY PO, Take 2 each by mouth daily., Disp: , Rfl:  .  gabapentin (NEURONTIN) 100 MG capsule, Take 2 capsules (200 mg total) by mouth 3 (three) times daily. Final refill, Disp: 180 capsule, Rfl: 11 .  Multiple Vitamin (MULTIVITAMIN WITH MINERALS) TABS tablet, Take 1 tablet by mouth daily., Disp: , Rfl:  .  Multiple Vitamins-Minerals (HAIR SKIN AND NAILS FORMULA PO), Take 2 capsules by mouth daily. Plus Biotin, Disp: , Rfl:   PAST MEDICAL HISTORY: Past  Medical History:  Diagnosis Date  . Anemia   . Chemotherapy-induced nausea and vomiting    intermittant  . Depression with anxiety   . History of cancer chemotherapy    cervical cancer--- 08-17-2018 to 09-07-2018  . History of external beam radiation therapy    cervical cancer--- 08-13-2018 to 09-08-2018  . Intermittent diarrhea    secondary to radiation therapy  . Malignant neoplasm of overlapping sites of cervix [facility]) oncologist-- dr gorsuch/  dr Sondra Come   dx 07-28-2018--- Stage IB3--  completed chemotherapy 09-07-2018 and IMRT 09-08-2018  . Migraine   . Neuropathic pain   . Pancytopenia, acquired ([place])   . Pelvic mass in female 07/28/2018    PAST SURGICAL HISTORY: Past Surgical History:  Procedure Laterality Date  . IR IMAGING GUIDED PORT INSERTION  08/11/2018  . IR REMOVAL TUN ACCESS W/ PORT W/O FL MOD SED  11/30/2018  . OPERATIVE ULTRASOUND N/A 09/15/2018  Procedure: OPERATIVE ULTRASOUND;  Surgeon: Gery Pray, MD;  Location: [facility];  Service: Urology;  Laterality: N/A;  . ROBOTIC ASSISTED TOTAL HYSTERECTOMY WITH BILATERAL SALPINGO OOPHERECTOMY Bilateral 10/29/2018   Procedure: XI ROBOTIC ASSISTED TOTAL HYSTERECTOMY WITH BILATERAL SALPINGO OOPHORECTOMY;  Surgeon: Everitt Amber, MD;  Location: [facility];  Service: Gynecology;  Laterality: Bilateral;  . TANDEM RING INSERTION N/A 09/15/2018   Procedure: TANDEM RING INSERTION;  Surgeon: Gery Pray, MD;  Location: [facility];  Service: Urology;  Laterality: N/A;  . WISDOM TOOTH EXTRACTION      FAMILY HISTORY: Family History  Problem Relation Age of Onset  . Heart disease Paternal Uncle   . Heart disease Paternal Grandfather   . Multiple sclerosis Mother   . Heart disease Father   . Multiple sclerosis Sister     SOCIAL HISTORY:  Social History   Socioeconomic History  . Marital status: Married    Spouse name: Leane Para  . Number of children: 3  . Years of education: 59  . Highest  education level: Not on file  Occupational History  . Not on file  Tobacco Use  . Smoking status: Former Smoker    Years: 8.00    Types: Cigarettes    Quit date: 02/15/2018    Years since quitting: 1.3  . Smokeless tobacco: Never Used  Substance and Sexual Activity  . Alcohol use: Not Currently  . Drug use: Never  . Sexual activity: Yes    Birth control/protection: Other-see comments    Comment: husband ---- vasectomy ;  abstinence since 06/ 2020  Other Topics Concern  . Not on file  Social History Narrative   Lives with husband, children   Caffeine ,limited   Social Determinants of Health   Financial Resource Strain:   . Difficulty of Paying Living Expenses:   Food Insecurity:   . Worried About Charity fundraiser in the Last Year:   . Arboriculturist in the Last Year:   Transportation Needs:   . Film/video editor (Medical):   Marland Kitchen Lack of Transportation (Non-Medical):   Physical Activity:   . Days of Exercise per Week:   . Minutes of Exercise per Session:   Stress:   . Feeling of Stress :   Social Connections:   . Frequency of Communication with Friends and Family:   . Frequency of Social Gatherings with Friends and Family:   . Attends Religious Services:   . Active Member of Clubs or Organizations:   . Attends Archivist Meetings:   Marland Kitchen Marital Status:   Intimate Partner Violence:   . Fear of Current or Ex-Partner:   . Emotionally Abused:   Marland Kitchen Physically Abused:   . Sexually Abused:      DIAGNOSTIC DATA (LABS, IMAGING, TESTING) - I reviewed patient records, labs, notes, testing and imaging myself where available.  Lab Results  Component Value Date   WBC 3.4 (L) 11/30/2018   HGB 12.3 11/30/2018   HCT 37.1 11/30/2018   MCV 97.6 11/30/2018   PLT 235 11/30/2018      Component Value Date/Time   NA 141 10/26/2018 1152   NA 140 03/20/2018 1237   K 4.1 10/26/2018 1152   CL 107 10/26/2018 1152   CO2 26 10/26/2018 1152   GLUCOSE 76 10/26/2018  1152   BUN 6 10/26/2018 1152   BUN 7 03/20/2018 1237   CREATININE 0.65 10/26/2018 1152   CREATININE 0.71 08/13/2018 1614   CALCIUM 9.4  10/26/2018 1152   PROT 7.1 10/26/2018 1152   PROT 7.0 03/20/2018 1237   ALBUMIN 4.4 10/26/2018 1152   ALBUMIN 3.8 04/08/2018 1641   AST 15 10/26/2018 1152   AST 13 (L) 08/13/2018 1614   ALT 14 10/26/2018 1152   ALT 8 08/13/2018 1614   ALKPHOS 43 10/26/2018 1152   BILITOT 0.5 10/26/2018 1152   BILITOT 0.3 08/13/2018 1614   GFRNONAA >60 10/26/2018 1152   GFRNONAA >60 08/13/2018 1614   GFRAA >60 10/26/2018 1152   GFRAA >60 08/13/2018 1614    Lab Results  Component Value Date   TSH 1.465 07/25/2018   _________________________________________  Chronic migraine  Dysesthesia  Neck pain   1.   She continues to have chronic headaches despite trials of antiepileptic, antidepressant and muscle relaxant medications.    We discussed the anti-CGRP compounds.   She was provided with a sample of Aimovig 2.    For now will continue other med's.  If headaches better can reduce or stop 3.   rtc 6 months, sooner if problems.     Gustabo Gordillo A. Felecia Shelling, MD, PhD, FAAN Certified in Neurology, Clinical Neurophysiology, Sleep Medicine, Pain Medicine and Neuroimaging Director, [place] [facility] [address] [place], [place] [address] [phone]

## 2019-06-09 NOTE — Telephone Encounter (Signed)
Patient's insurance called and stated that the procedure can't be preformed at [facility]. Ask for the order to be faxed to [facility]. Order faxed

## 2019-06-15 ENCOUNTER — Telehealth: Payer: Self-pay

## 2019-06-15 NOTE — Telephone Encounter (Signed)
Told Ms Macdermott that the area with in the left vaginal cuff has resolved per Melissa Cross,NP Pt verbalized understanding.

## 2019-06-19 ENCOUNTER — Ambulatory Visit (HOSPITAL_COMMUNITY): Payer: Managed Care, Other (non HMO)

## 2019-06-24 ENCOUNTER — Other Ambulatory Visit: Payer: Self-pay

## 2019-06-24 ENCOUNTER — Encounter: Payer: Self-pay | Admitting: Gynecologic Oncology

## 2019-06-24 ENCOUNTER — Ambulatory Visit
Admission: RE | Admit: 2019-06-24 | Discharge: 2019-06-24 | Disposition: A | Discharge status: Home / Self Care | Payer: Self-pay | Source: Ambulatory Visit | Attending: Gynecologic Oncology | Admitting: Gynecologic Oncology

## 2019-06-24 ENCOUNTER — Inpatient Hospital Stay: Payer: Managed Care, Other (non HMO) | Attending: Gynecologic Oncology | Admitting: Gynecologic Oncology

## 2019-06-24 VITALS — BP 114/75 | HR 67 | Temp 98.7°F | Resp 18 | Ht 61.0 in | Wt 125.6 lb

## 2019-06-24 DIAGNOSIS — C539 Malignant neoplasm of cervix uteri, unspecified: Secondary | ICD-10-CM | POA: Insufficient documentation

## 2019-06-24 DIAGNOSIS — Z9221 Personal history of antineoplastic chemotherapy: Secondary | ICD-10-CM | POA: Insufficient documentation

## 2019-06-24 DIAGNOSIS — Z923 Personal history of irradiation: Secondary | ICD-10-CM | POA: Insufficient documentation

## 2019-06-24 DIAGNOSIS — C538 Malignant neoplasm of overlapping sites of cervix uteri: Secondary | ICD-10-CM

## 2019-06-24 NOTE — Patient Instructions (Signed)
Please notify Dr Denman George at phone number [phone] if you notice vaginal bleeding, new pelvic or abdominal pains, bloating, feeling full easy, or a change in bladder or bowel function.   Please return to see Dr Sondra Come in August and Dr Denman George in November.

## 2019-06-24 NOTE — Progress Notes (Signed)
Follow-up Note: Gyn-Onc Consult was initially requested by Dr. Penny Pia and Dr Kennon Rounds for the evaluation of Jordan Waters 44 y.o. female  CC:  Chief Complaint  Patient presents with  . Cervical Cancer    Follow up    Assessment/Plan:  [name]  is a 44 y.o.  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  No evidence of recurrence.  She will follow-up with me in 6 months after she sees Dr Sondra Come in 3 months.  HPI: [name] is a 44 year old P2 who is seen in consultation at the request of Dr Kennon Rounds for evaluation of a cervical mass and bleeding.  The patient reports the onset of heavy vaginal bleeding since Fathers' Day 2020. This resulted in syncopal feelings on 07/24/18 which led to her visiting the [facility] ER the same day. A TVUS was performed which showed a possible endometrial mass.  An Hb was drawn which showed severe acute blood loss anemia of 7.7mg /dL.  She was admitted, received 2 units PRBC (with an appropriate corresponding rise in Hb to 9.7) and then received an MRI on 07/26/18. This showed the mildly enlarged anteverted uterus measures 10.7 x 5.4 x 5.5 cm. There is a 4.7 x 3.4 x 3.0 cm poorly marginated infiltrative mass centered in the anterior left uterine cervix which extends 12:00 to 4:00 in the lower cervix, and extends 12:00 to 8:00 in the upper cervix, demonstrating intermediate T2 signal intensity and enhancement. This mass demonstrates cervical stromal invasion of at least 6 mm with no frank parametrial invasion. Mass appears to invade the lower most portion of the uterine cavity. No evidence of vaginal or bladder invasion. Findings are worrisome for a FIGO stage IB3 cervical carcinoma.  She is an otherwise very healthy woman with no chronic medical disease.  She has never had abdominal surgery.    She had regular pap tests up until a year or two ago. Her only abnormal pap was in pregnancy approximately 20 years ago which resulted in what sounds like either a LEEP or CKC in pregnancy and subsequently normal paps after that time.   She has had 2 prior vaginal deliveries.  She has never been vaccinated against HPV.   Biopsy of the cervix on 07/28/18 showed adenocarcinoma of the cervix.  PET showed no apparent involvement outside of the cervix and uterus.   She commenced primary chemoradiation on 08/04/18 (though chemotherapy was delayed due to lack of port access).   She completed radiation to the pelvis and cervix with Dr. Gery Pray with radiosensitizing chemotherapy completed on September 15, 2018.  She received a single HDR tandem and ring brachii therapy treatment with anticipation for completion hysterectomy given her excellent response to therapy and stage I bulky disease.  The goal of this treatment approach was to minimize long-term toxicity of excessive brachii therapy by debulking the primary site.  MRI was performed following completion of therapy on October 20, 2018 and revealed a uterus measuring 7.1 x 6 4 x 4.7 cm with a small mass in the cervix so showing peripheral contrast-enhancement and a central area of necrosis that is significantly decreased in size.  It currently measures 2.1 x 1.7 cm.  The right and left ovaries were normal.  There is no peritoneal thickening or free fluid or lymphadenopathy appreciated.  On October 29, 2018 she underwent robotic assisted total hysterectomy (extrafascial) with  BSO.  Intraoperative findings revealed no gross residual cervical tumor and no suspicious lymph nodes.  Final pathology revealed focal residual adenocarcinoma, post neoadjuvant therapy.  Margins were negative.  Is a grade 2 moderately differentiated tumor.  The origin of the tumor was endocervical.  Post treatment imaging showed some thickening at the vaginal cuff.  Repeat  MRI at [place] on 06/14/19 showed resolution of this asymmetric focus of enhancement within the left vaginal cuff.   Interval History:  She returns today for scheduled follow-up. She continues to be bloated in the abdomen. She has had a hard time losing weight after treatment.  She has no symptoms concerning for recurrence.     Current Meds:  Outpatient Encounter Medications as of 06/24/2019  Medication Sig  . Ascorbic Acid (VITAMIN C) 100 MG tablet Take 100 mg by mouth daily.  Marland Kitchen conjugated estrogens (PREMARIN) vaginal cream Place 1 Applicatorful vaginally 3 (three) times a week.  . DULoxetine (CYMBALTA) 60 MG capsule Take 1 capsule (60 mg total) by mouth daily.  Marland Kitchen ELDERBERRY PO Take 2 each by mouth daily.  Eduard Roux (AIMOVIG) 70 MG/ML SOAJ Inject one syringe every 4 weeks  . gabapentin (NEURONTIN) 100 MG capsule Take 2 capsules (200 mg total) by mouth 3 (three) times daily. Final refill  . Multiple Vitamin (MULTIVITAMIN WITH MINERALS) TABS tablet Take 1 tablet by mouth daily.  . Multiple Vitamins-Minerals (HAIR SKIN AND NAILS FORMULA PO) Take 2 capsules by mouth daily. Plus Biotin   No facility-administered encounter medications on file as of 06/24/2019.    Allergy: No Known Allergies  Social Hx:   Social History   Socioeconomic History  . Marital status: Married    Spouse name: Leane Para  . Number of children: 3  . Years of education: 28  . Highest education level: Not on file  Occupational History  . Not on file  Tobacco Use  . Smoking status: Former Smoker    Years: 8.00    Types: Cigarettes    Quit date: 02/15/2018    Years since quitting: 1.3  . Smokeless tobacco: Never Used  Substance and Sexual Activity  . Alcohol use: Not Currently  . Drug use: Never  . Sexual activity: Yes    Birth control/protection: Other-see comments    Comment: husband ---- vasectomy ;  abstinence since 06/ 2020  Other Topics Concern  . Not on file  Social History Narrative   Lives with  husband, children   Caffeine ,limited   Social Determinants of Health   Financial Resource Strain:   . Difficulty of Paying Living Expenses:   Food Insecurity:   . Worried About Charity fundraiser in the Last Year:   . Arboriculturist in the Last Year:   Transportation Needs:   . Film/video editor (Medical):   Marland Kitchen Lack of Transportation (Non-Medical):   Physical Activity:   . Days of Exercise per Week:   . Minutes of Exercise per Session:   Stress:   . Feeling of Stress :   Social Connections:   . Frequency of Communication with Friends and Family:   . Frequency of Social Gatherings with Friends and Family:   . Attends Religious Services:   . Active Member of Clubs or Organizations:   . Attends Archivist Meetings:   Marland Kitchen Marital Status:   Intimate Partner Violence:   . Fear of Current or Ex-Partner:   . Emotionally Abused:   Marland Kitchen Physically Abused:   . Sexually  Abused:     Past Surgical Hx:  Past Surgical History:  Procedure Laterality Date  . IR IMAGING GUIDED PORT INSERTION  08/11/2018  . IR REMOVAL TUN ACCESS W/ PORT W/O FL MOD SED  11/30/2018  . OPERATIVE ULTRASOUND N/A 09/15/2018   Procedure: OPERATIVE ULTRASOUND;  Surgeon: Gery Pray, MD;  Location: [facility];  Service: Urology;  Laterality: N/A;  . ROBOTIC ASSISTED TOTAL HYSTERECTOMY WITH BILATERAL SALPINGO OOPHERECTOMY Bilateral 10/29/2018   Procedure: XI ROBOTIC ASSISTED TOTAL HYSTERECTOMY WITH BILATERAL SALPINGO OOPHORECTOMY;  Surgeon: Everitt Amber, MD;  Location: [facility];  Service: Gynecology;  Laterality: Bilateral;  . TANDEM RING INSERTION N/A 09/15/2018   Procedure: TANDEM RING INSERTION;  Surgeon: Gery Pray, MD;  Location: [facility];  Service: Urology;  Laterality: N/A;  . WISDOM TOOTH EXTRACTION      Past Medical Hx:  Past Medical History:  Diagnosis Date  . Anemia   . Chemotherapy-induced nausea and vomiting    intermittant  . Depression with anxiety   .  History of cancer chemotherapy    cervical cancer--- 08-17-2018 to 09-07-2018  . History of external beam radiation therapy    cervical cancer--- 08-13-2018 to 09-08-2018  . Intermittent diarrhea    secondary to radiation therapy  . Malignant neoplasm of overlapping sites of cervix [facility]) oncologist-- dr gorsuch/  dr Sondra Come   dx 07-28-2018--- Stage IB3--  completed chemotherapy 09-07-2018 and IMRT 09-08-2018  . Migraine   . Neuropathic pain   . Pancytopenia, acquired ([place])   . Pelvic mass in female 07/28/2018    Past Gynecological History:  See HPI  No LMP recorded. Patient has had a hysterectomy.  Family Hx:  Family History  Problem Relation Age of Onset  . Heart disease Paternal Uncle   . Heart disease Paternal Grandfather   . Multiple sclerosis Mother   . Heart disease Father   . Multiple sclerosis Sister     Review of Systems:  Constitutional  Feels fatigued  ENT Normal appearing ears and nares bilaterally Skin/Breast  No rash, sores, jaundice, itching, dryness Cardiovascular  No chest pain, shortness of breath, or edema  Pulmonary  No cough or wheeze.  Gastro Intestinal  No nausea, vomitting, or diarrhoea. No bright red blood per rectum, no abdominal pain, change in bowel movement, or constipation.  Genito Urinary  No frequency, urgency, dysuria, no bleeding Musculo Skeletal  No myalgia, arthralgia, joint swelling or pain  Neurologic  No weakness, numbness, change in gait,  Psychology  No depression, anxiety, insomnia.   Vitals:  Blood pressure 114/75, pulse 67, temperature 98.7 F (37.1 C), temperature source Temporal, resp. rate 18, height 5\' 1"  (1.549 m), weight 125 lb 9.6 oz (57 kg), SpO2 100 %.  Physical Exam: WD in NAD Neck  Supple NROM, without any enlargements.  Lymph Node Survey No cervical supraclavicular or inguinal adenopathy Cardiovascular  Pulse normal rate, regularity and rhythm. S1 and S2 normal.  Lungs  Clear to auscultation  bilateraly, without wheezes/crackles/rhonchi. Good air movement.  Skin  No rash/lesions/breakdown  Psychiatry  Alert and oriented to person, place, and time  Abdomen  Normoactive bowel sounds, abdomen soft, and thin without evidence of hernia. Soft incisions. Tender suprapubic region with fullness. Back No CVA tenderness Genito Urinary  Vulva/vagina: Normal external female genitalia.   No lesions. No discharge  Bladder/urethra:  No lesions or masses, well supported bladder  Vagina: scant dark red blood at top of vagina.Cuff in tact, atrophy present. No lesions or  palpable masses. Vaginal mucosa some what friable.  Rectal  No palpable masses Extremities  No bilateral cyanosis, clubbing or edema.  Thereasa Solo, MD  06/24/2019, 5:07 PM

## 2019-09-06 ENCOUNTER — Encounter: Payer: Self-pay | Admitting: Radiation Oncology

## 2019-09-06 ENCOUNTER — Other Ambulatory Visit: Payer: Self-pay

## 2019-09-06 ENCOUNTER — Ambulatory Visit
Admission: RE | Admit: 2019-09-06 | Discharge: 2019-09-06 | Disposition: A | Discharge status: Home / Self Care | Payer: Managed Care, Other (non HMO) | Source: Ambulatory Visit | Attending: Radiation Oncology | Admitting: Radiation Oncology

## 2019-09-06 VITALS — BP 132/70 | HR 72 | Temp 98.7°F | Resp 18 | Ht 61.0 in | Wt 120.5 lb

## 2019-09-06 DIAGNOSIS — Z8542 Personal history of malignant neoplasm of other parts of uterus: Secondary | ICD-10-CM | POA: Insufficient documentation

## 2019-09-06 DIAGNOSIS — Z923 Personal history of irradiation: Secondary | ICD-10-CM | POA: Insufficient documentation

## 2019-09-06 DIAGNOSIS — C538 Malignant neoplasm of overlapping sites of cervix uteri: Secondary | ICD-10-CM

## 2019-09-06 DIAGNOSIS — R519 Headache, unspecified: Secondary | ICD-10-CM | POA: Diagnosis not present

## 2019-09-06 DIAGNOSIS — Z79899 Other long term (current) drug therapy: Secondary | ICD-10-CM | POA: Diagnosis not present

## 2019-09-06 DIAGNOSIS — N888 Other specified noninflammatory disorders of cervix uteri: Secondary | ICD-10-CM

## 2019-09-06 DIAGNOSIS — R208 Other disturbances of skin sensation: Secondary | ICD-10-CM | POA: Diagnosis not present

## 2019-09-06 DIAGNOSIS — M542 Cervicalgia: Secondary | ICD-10-CM | POA: Diagnosis not present

## 2019-09-06 NOTE — Progress Notes (Signed)
Radiation Oncology         [id number]) [phone] ________________________________  Name: Jordan Waters MRN: [id number]  Date: 09/06/2019  DOB: 24-Sep-1975  Follow-Up Visit Note  CC: Esaw Grandchild, NP  Danford, Berna Spare, NP    ICD-10-CM   1. Malignant neoplasm of overlapping sites of cervix ([place])  C53.8   2. Cervical mass  N88.8     Diagnosis: Stage IB-3 adenocarcinoma of the cervix  Interval Since Last Radiation: Eleven months, three weeks, and one day.  Radiation Treatment Dates: 07/31/2018 through 09/08/2018 Site Technique Total Dose Dose per Fx Completed Fx Beam Energies  Pelvis: Cervix_pelvis 3D 45/45 1.8 25/25 15X   09/15/2018: Brachytherapy/HDR, 45 tandem/ring system, 27mm tandem / 5.5 Gy in 1 fraction  Narrative:  The patient returns today for follow-up. Of note, she followed-up with Dr. Felecia Shelling, neurologist, on 06/08/2019 for chronic headaches, dysesthesia, and neck pain. She was provided a sample of Aimovig since Antiepileptic, antidepressant, and muscle relaxant medications were not providing relief.  Repeat MRI of abdomen and pelvis was performed at [facility] on 06/14/2019 and showed resolution of the asymmetric focus of enhancement within the left vaginal cuff.  The patient followed-up with Dr. Denman George on 06/24/2019, during which time there was no evidence of recurrence.  On review of systems, she reports no complaints. She denies vaginal bleeding, pelvic pain, and changes in bladder patterns.  Estrace vaginal cream has been helpful for her.  sHe continues to use vaginal dilator as recommended.  ALLERGIES:  has No Known Allergies.  Meds: Current Outpatient Medications  Medication Sig Dispense Refill  . Ascorbic Acid (VITAMIN C) 100 MG tablet Take 100 mg by mouth daily.    . DULoxetine (CYMBALTA) 60 MG capsule Take 1 capsule (60 mg total) by mouth daily. 30 capsule 11  . ELDERBERRY PO Take 2 each by mouth daily.    Marland Kitchen gabapentin (NEURONTIN) 100 MG capsule Take 2 capsules (200  mg total) by mouth 3 (three) times daily. Final refill 180 capsule 11  . Multiple Vitamin (MULTIVITAMIN WITH MINERALS) TABS tablet Take 1 tablet by mouth daily.    . Multiple Vitamins-Minerals (HAIR SKIN AND NAILS FORMULA PO) Take 2 capsules by mouth daily. Plus Biotin    . conjugated estrogens (PREMARIN) vaginal cream Place 1 Applicatorful vaginally 3 (three) times a week. (Patient not taking: Reported on 09/06/2019) 42.5 g 12  . Erenumab-aooe (AIMOVIG) 70 MG/ML SOAJ Inject one syringe every 4 weeks (Patient not taking: Reported on 09/06/2019) 3 pen 4   No current facility-administered medications for this encounter.    Physical Findings: The patient is in no acute distress. Patient is alert and oriented.  height is 5\' 1"  (1.549 m) and weight is 120 lb 8 oz (54.7 kg). Her oral temperature is 98.7 F (37.1 C). Her blood pressure is 132/70 and her pulse is 72. Her respiration is 18 and oxygen saturation is 100%.   Lungs are clear to auscultation bilaterally. Heart has regular rate and rhythm. No palpable cervical, supraclavicular, or axillary adenopathy. Abdomen soft, non-tender, normal bowel sounds. On pelvic examination the external genitalia were unremarkable. A speculum exam was performed. There are no mucosal lesions noted in the vaginal vault. On bimanual  examination there were no pelvic masses appreciated.  She was able to tolerate the pelvic exam much better today.  Lab Findings: Lab Results  Component Value Date   WBC 3.4 (L) 11/30/2018   HGB 12.3 11/30/2018   HCT 37.1 11/30/2018   MCV 97.6 11/30/2018  PLT 235 11/30/2018    Radiographic Findings: No results found.  Impression: Stage IB-3 adenocarcinoma of the cervix  No evidence of recurrence on clinical exam today. Recent MRI of abdomen and pelvis showed resolution of the asymmetric focus of enhancement within the left vaginal cuff.   Plan: The patient will follow-up with Dr. Denman George in three months and with radiation oncology  in six months.  Total time spent in this encounter was 25  minutes which included reviewing the patient's most recent follow-ups, MRI, physical examination, and documentation. ____________________________________   Blair Promise, PhD, MD  This document serves as a record of services personally performed by Gery Pray, MD. It was created on his behalf by Clerance Lav, a trained medical scribe. The creation of this record is based on the scribe's personal observations and the provider's statements to them. This document has been checked and approved by the attending provider.

## 2019-09-06 NOTE — Progress Notes (Addendum)
Patient here for a f/u visit with Dr. Sondra Come. Patient denies any  Bladder problems or pain.She denies any vaginal bleeding.  BP 132/70 (BP Location: Right Arm, Patient Position: Sitting)   Pulse 72   Temp 98.7 F (37.1 C) (Oral)   Resp 18   Ht 5\' 1"  (1.549 m)   Wt 120 lb 8 oz (54.7 kg)   SpO2 100%   BMI 22.77 kg/m   Wt Readings from Last 3 Encounters:  09/06/19 120 lb 8 oz (54.7 kg)  06/24/19 125 lb 9.6 oz (57 kg)  06/08/19 123 lb (55.8 kg)      Patient reports taking Black Cohosh and Omega 3,6,9 Fish oil in addition to her prescribed meds.

## 2019-11-23 ENCOUNTER — Other Ambulatory Visit: Payer: Self-pay

## 2019-11-23 ENCOUNTER — Encounter: Payer: Self-pay | Admitting: Gynecologic Oncology

## 2019-11-23 ENCOUNTER — Other Ambulatory Visit (HOSPITAL_COMMUNITY)
Admission: RE | Admit: 2019-11-23 | Discharge: 2019-11-23 | Disposition: A | Discharge status: Home / Self Care | Payer: Managed Care, Other (non HMO) | Source: Ambulatory Visit | Attending: Gynecologic Oncology | Admitting: Gynecologic Oncology

## 2019-11-23 ENCOUNTER — Inpatient Hospital Stay: Payer: Managed Care, Other (non HMO) | Attending: Gynecologic Oncology | Admitting: Gynecologic Oncology

## 2019-11-23 VITALS — BP 108/65 | Temp 97.0°F | Resp 18 | Wt 120.2 lb

## 2019-11-23 DIAGNOSIS — F418 Other specified anxiety disorders: Secondary | ICD-10-CM | POA: Insufficient documentation

## 2019-11-23 DIAGNOSIS — Z8541 Personal history of malignant neoplasm of cervix uteri: Secondary | ICD-10-CM | POA: Insufficient documentation

## 2019-11-23 DIAGNOSIS — Z923 Personal history of irradiation: Secondary | ICD-10-CM

## 2019-11-23 DIAGNOSIS — C538 Malignant neoplasm of overlapping sites of cervix uteri: Secondary | ICD-10-CM

## 2019-11-23 DIAGNOSIS — Z9221 Personal history of antineoplastic chemotherapy: Secondary | ICD-10-CM

## 2019-11-23 DIAGNOSIS — Z08 Encounter for follow-up examination after completed treatment for malignant neoplasm: Secondary | ICD-10-CM | POA: Diagnosis present

## 2019-11-23 DIAGNOSIS — Z90710 Acquired absence of both cervix and uterus: Secondary | ICD-10-CM | POA: Diagnosis not present

## 2019-11-23 DIAGNOSIS — Z87891 Personal history of nicotine dependence: Secondary | ICD-10-CM | POA: Insufficient documentation

## 2019-11-23 DIAGNOSIS — Z90722 Acquired absence of ovaries, bilateral: Secondary | ICD-10-CM | POA: Diagnosis not present

## 2019-11-23 NOTE — Progress Notes (Signed)
Follow-up Note: Gyn-Onc Consult was initially requested by Dr. Penny Pia and Dr Kennon Rounds for the evaluation of Jordan Waters 44 y.o. female  CC:  Chief Complaint  Patient presents with  . Malignant neoplasm of overlapping sites of cervix [facility])    Assessment/Plan:  Jordan. Kendyl Waters  is a 44 y.o.  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  No evidence of recurrence.  She will follow-up with me in 6 months after she sees Dr Sondra Come in 3 months.  Pap's annually in November.   HPI: Jordan Jordan Waters is a 44 year old P2 who is seen in consultation at the request of Dr Kennon Rounds for evaluation of a cervical mass and bleeding.  The patient reports the onset of heavy vaginal bleeding since Fathers' Day 2020. This resulted in syncopal feelings on 07/24/18 which led to her visiting the [facility] ER the same day. A TVUS was performed which showed a possible endometrial mass.  An Hb was drawn which showed severe acute blood loss anemia of 7.7mg /dL.  She was admitted, received 2 units PRBC (with an appropriate corresponding rise in Hb to 9.7) and then received an MRI on 07/26/18. This showed the mildly enlarged anteverted uterus measures 10.7 x 5.4 x 5.5 cm. There is a 4.7 x 3.4 x 3.0 cm poorly marginated infiltrative mass centered in the anterior left uterine cervix which extends 12:00 to 4:00 in the lower cervix, and extends 12:00 to 8:00 in the upper cervix, demonstrating intermediate T2 signal intensity and enhancement. This mass demonstrates cervical stromal invasion of at least 6 mm with no frank parametrial invasion. Mass appears to invade the lower most portion of the uterine cavity. No evidence of vaginal or bladder invasion. Findings are worrisome for a FIGO stage IB3 cervical carcinoma.  She is an otherwise very healthy woman with no chronic  medical disease.  She has never had abdominal surgery.   She had regular pap tests up until a year or two ago. Her only abnormal pap was in pregnancy approximately 20 years ago which resulted in what sounds like either a LEEP or CKC in pregnancy and subsequently normal paps after that time.   She has had 2 prior vaginal deliveries.  She has never been vaccinated against HPV.   Biopsy of the cervix on 07/28/18 showed adenocarcinoma of the cervix.  PET showed no apparent involvement outside of the cervix and uterus.   She commenced primary chemoradiation on 08/04/18 (though chemotherapy was delayed due to lack of port access).   She completed radiation to the pelvis and cervix with Dr. Gery Pray with radiosensitizing chemotherapy completed on September 15, 2018.  She received a single HDR tandem and ring brachii therapy treatment with anticipation for completion hysterectomy given her excellent response to therapy and stage I bulky disease.  The goal of this treatment approach was to minimize long-term toxicity of excessive brachii therapy by debulking the primary site.  MRI was performed following completion of therapy on October 20, 2018 and revealed a uterus measuring 7.1 x 6 4 x 4.7 cm with a small mass in the cervix so showing peripheral contrast-enhancement and a central area of necrosis that is significantly decreased in size.  It currently measures 2.1 x 1.7 cm.  The right and left ovaries were normal.  There is no peritoneal thickening or free fluid or lymphadenopathy appreciated.  On October 29, 2018 she  underwent robotic assisted total hysterectomy (extrafascial) with BSO.  Intraoperative findings revealed no gross residual cervical tumor and no suspicious lymph nodes.  Final pathology revealed focal residual adenocarcinoma, post neoadjuvant therapy.  Margins were negative.  Is a grade 2 moderately differentiated tumor.  The origin of the tumor was endocervical.  Post treatment  imaging showed some thickening at the vaginal cuff.  Repeat MRI at [facility] on 06/14/19 showed resolution of this asymmetric focus of enhancement within the left vaginal cuff.   Interval History:  She returns today for scheduled follow-up. She has no symptoms concerning for recurrence.  Current Meds:  Outpatient Encounter Medications as of 11/23/2019  Medication Sig  . Ascorbic Acid (VITAMIN C) 100 MG tablet Take 100 mg by mouth daily.  Marland Kitchen conjugated estrogens (PREMARIN) vaginal cream Place 1 Applicatorful vaginally 3 (three) times a week.  . DULoxetine (CYMBALTA) 60 MG capsule Take 1 capsule (60 mg total) by mouth daily.  Marland Kitchen ELDERBERRY PO Take 2 each by mouth daily.  Marland Kitchen gabapentin (NEURONTIN) 100 MG capsule Take 2 capsules (200 mg total) by mouth 3 (three) times daily. Final refill  . Multiple Vitamin (MULTIVITAMIN WITH MINERALS) TABS tablet Take 1 tablet by mouth daily.  . [DISCONTINUED] Erenumab-aooe (AIMOVIG) 70 MG/ML SOAJ Inject one syringe every 4 weeks (Patient not taking: Reported on 09/06/2019)  . [DISCONTINUED] Multiple Vitamins-Minerals (HAIR SKIN AND NAILS FORMULA PO) Take 2 capsules by mouth daily. Plus Biotin   No facility-administered encounter medications on file as of 11/23/2019.    Allergy: No Known Allergies  Social Hx:   Social History   Socioeconomic History  . Marital status: Married    Spouse name: Leane Para  . Number of children: 3  . Years of education: 76  . Highest education level: Not on file  Occupational History  . Not on file  Tobacco Use  . Smoking status: Former Smoker    Years: 8.00    Types: Cigarettes    Quit date: 02/15/2018    Years since quitting: 1.7  . Smokeless tobacco: Never Used  Vaping Use  . Vaping Use: Never used  Substance and Sexual Activity  . Alcohol use: Not Currently  . Drug use: Never  . Sexual activity: Yes    Birth control/protection: Other-see comments    Comment: husband ---- vasectomy ;  abstinence since 06/ 2020  Other  Topics Concern  . Not on file  Social History Narrative   Lives with husband, children   Caffeine ,limited   Social Determinants of Health   Financial Resource Strain:   . Difficulty of Paying Living Expenses: Not on file  Food Insecurity:   . Worried About Charity fundraiser in the Last Year: Not on file  . Ran Out of Food in the Last Year: Not on file  Transportation Needs:   . Lack of Transportation (Medical): Not on file  . Lack of Transportation (Non-Medical): Not on file  Physical Activity:   . Days of Exercise per Week: Not on file  . Minutes of Exercise per Session: Not on file  Stress:   . Feeling of Stress : Not on file  Social Connections:   . Frequency of Communication with Friends and Family: Not on file  . Frequency of Social Gatherings with Friends and Family: Not on file  . Attends Religious Services: Not on file  . Active Member of Clubs or Organizations: Not on file  . Attends Archivist Meetings: Not on file  . Marital  Status: Not on file  Intimate Partner Violence:   . Fear of Current or Ex-Partner: Not on file  . Emotionally Abused: Not on file  . Physically Abused: Not on file  . Sexually Abused: Not on file    Past Surgical Hx:  Past Surgical History:  Procedure Laterality Date  . IR IMAGING GUIDED PORT INSERTION  08/11/2018  . IR REMOVAL TUN ACCESS W/ PORT W/O FL MOD SED  11/30/2018  . OPERATIVE ULTRASOUND N/A 09/15/2018   Procedure: OPERATIVE ULTRASOUND;  Surgeon: Gery Pray, MD;  Location: [facility];  Service: Urology;  Laterality: N/A;  . ROBOTIC ASSISTED TOTAL HYSTERECTOMY WITH BILATERAL SALPINGO OOPHERECTOMY Bilateral 10/29/2018   Procedure: XI ROBOTIC ASSISTED TOTAL HYSTERECTOMY WITH BILATERAL SALPINGO OOPHORECTOMY;  Surgeon: Everitt Amber, MD;  Location: [facility];  Service: Gynecology;  Laterality: Bilateral;  . TANDEM RING INSERTION N/A 09/15/2018   Procedure: TANDEM RING INSERTION;  Surgeon: Gery Pray, MD;   Location: [facility];  Service: Urology;  Laterality: N/A;  . WISDOM TOOTH EXTRACTION      Past Medical Hx:  Past Medical History:  Diagnosis Date  . Anemia   . Chemotherapy-induced nausea and vomiting    intermittant  . Depression with anxiety   . History of cancer chemotherapy    cervical cancer--- 08-17-2018 to 09-07-2018  . History of external beam radiation therapy    cervical cancer--- 08-13-2018 to 09-08-2018  . Intermittent diarrhea    secondary to radiation therapy  . Malignant neoplasm of overlapping sites of cervix [facility]) oncologist-- dr gorsuch/  dr Sondra Come   dx 07-28-2018--- Stage IB3--  completed chemotherapy 09-07-2018 and IMRT 09-08-2018  . Migraine   . Neuropathic pain   . Pancytopenia, acquired ([place])   . Pelvic mass in female 07/28/2018    Past Gynecological History:  See HPI  No LMP recorded. Patient has had a hysterectomy.  Family Hx:  Family History  Problem Relation Age of Onset  . Heart disease Paternal Uncle   . Heart disease Paternal Grandfather   . Multiple sclerosis Mother   . Heart disease Father   . Multiple sclerosis Sister     Review of Systems:  Constitutional  Feels fatigued  ENT Normal appearing ears and nares bilaterally Skin/Breast  No rash, sores, jaundice, itching, dryness Cardiovascular  No chest pain, shortness of breath, or edema  Pulmonary  No cough or wheeze.  Gastro Intestinal  No nausea, vomitting, or diarrhoea. No bright red blood per rectum, no abdominal pain, change in bowel movement, or constipation.  Genito Urinary  No frequency, urgency, dysuria, no bleeding Musculo Skeletal  No myalgia, arthralgia, joint swelling or pain  Neurologic  No weakness, numbness, change in gait,  Psychology  No depression, anxiety, insomnia.   Vitals:  Blood pressure 108/65, temperature (!) 97 F (36.1 C), temperature source Tympanic, resp. rate 18, weight 120 lb 2.4 oz (54.5 kg), SpO2 99 %.  Physical Exam: WD  in NAD Neck  Supple NROM, without any enlargements.  Lymph Node Survey No cervical supraclavicular or inguinal adenopathy Cardiovascular  Pulse normal rate, regularity and rhythm. S1 and S2 normal.  Lungs  Clear to auscultation bilateraly, without wheezes/crackles/rhonchi. Good air movement.  Skin  No rash/lesions/breakdown  Psychiatry  Alert and oriented to person, place, and time  Abdomen  Normoactive bowel sounds, abdomen soft, and thin without evidence of hernia. Soft incisions. Tender suprapubic region with fullness. Back No CVA tenderness Genito Urinary  Vulva/vagina: Normal external female genitalia.  No lesions. No discharge  Bladder/urethra:  No lesions or masses, well supported bladder  Vagina: scant dark red blood at top of vagina.Cuff in tact, atrophy present. No lesions or palpable masses. Pap taken.  Rectal  No palpable masses Extremities  No bilateral cyanosis, clubbing or edema.  Thereasa Solo, MD  11/23/2019, 2:07 PM

## 2019-11-23 NOTE — Patient Instructions (Signed)
Please notify Dr Denman George at phone number [phone] if you notice vaginal bleeding, new pelvic or abdominal pains, bloating, feeling full easy, or a change in bladder or bowel function.   Dr Serita Grit office will call you with the results of today's pap test.  Please have Dr Clabe Seal office contact Dr Serita Grit office (at [phone]) in February after your appointment with him after your appointment with him to request an appointment with Dr Denman George for May.

## 2019-11-23 NOTE — Addendum Note (Signed)
Addended by: [name] D on: 11/23/2019 04:49 PM   Modules accepted: Orders

## 2019-11-25 LAB — CYTOLOGY - PAP
Comment: NEGATIVE
Diagnosis: NEGATIVE
High risk HPV: NEGATIVE

## 2019-11-30 ENCOUNTER — Telehealth: Payer: Self-pay

## 2019-11-30 NOTE — Telephone Encounter (Signed)
Told Ms Charbonneau that the pap smear was normal and HPV high risk negative per [name], NP.

## 2019-12-13 ENCOUNTER — Ambulatory Visit (INDEPENDENT_AMBULATORY_CARE_PROVIDER_SITE_OTHER): Payer: Managed Care, Other (non HMO) | Admitting: Family Medicine

## 2019-12-13 ENCOUNTER — Encounter: Payer: Self-pay | Admitting: Family Medicine

## 2019-12-13 VITALS — BP 99/64 | HR 84

## 2019-12-13 DIAGNOSIS — R208 Other disturbances of skin sensation: Secondary | ICD-10-CM | POA: Diagnosis not present

## 2019-12-13 DIAGNOSIS — M542 Cervicalgia: Secondary | ICD-10-CM | POA: Diagnosis not present

## 2019-12-13 DIAGNOSIS — R269 Unspecified abnormalities of gait and mobility: Secondary | ICD-10-CM | POA: Diagnosis not present

## 2019-12-13 DIAGNOSIS — F418 Other specified anxiety disorders: Secondary | ICD-10-CM

## 2019-12-13 DIAGNOSIS — M791 Myalgia, unspecified site: Secondary | ICD-10-CM | POA: Diagnosis not present

## 2019-12-13 DIAGNOSIS — G44221 Chronic tension-type headache, intractable: Secondary | ICD-10-CM

## 2019-12-13 MED ORDER — DULOXETINE HCL 30 MG PO CPEP: ORAL_CAPSULE | ORAL | 3 refills | Status: DC

## 2019-12-13 NOTE — Patient Instructions (Signed)
Below is our plan:  We will increase duloxetine to 60mg  in the mornings and 30mg  in the evenings. Continue gabapentin 200mg  three times daily. Consider PT referral for evaluation of gait if falls continue. Fall precautions advised.   Please make sure you are staying well hydrated. I recommend 50-60 ounces daily. Well balanced diet and regular exercise encouraged.    Please continue follow up with care team as directed.   Follow up with Dr Felecia Shelling in 6 months   You may receive a survey regarding today's visit. I encourage you to leave honest feed back as I do use this information to improve patient care. Thank you for seeing me today!     Fall Prevention in the Home, Adult Falls can cause injuries. They can happen to people of all ages. There are many things you can do to make your home safe and to help prevent falls. Ask for help when making these changes, if needed. What actions can I take to prevent falls? General Instructions  Use good lighting in all rooms. Replace any light bulbs that burn out.  Turn on the lights when you go into a dark area. Use night-lights.  Keep items that you use often in easy-to-reach places. Lower the shelves around your home if necessary.  Set up your furniture so you have a clear path. Avoid moving your furniture around.  Do not have throw rugs and other things on the floor that can make you trip.  Avoid walking on wet floors.  If any of your floors are uneven, fix them.  Add color or contrast paint or tape to clearly mark and help you see: ? Any grab bars or handrails. ? First and last steps of stairways. ? Where the edge of each step is.  If you use a stepladder: ? Make sure that it is fully opened. Do not climb a closed stepladder. ? Make sure that both sides of the stepladder are locked into place. ? Ask someone to hold the stepladder for you while you use it.  If there are any pets around you, be aware of where they are. What can I do in  the bathroom?      Keep the floor dry. Clean up any water that spills onto the floor as soon as it happens.  Remove soap buildup in the tub or shower regularly.  Use non-skid mats or decals on the floor of the tub or shower.  Attach bath mats securely with double-sided, non-slip rug tape.  If you need to sit down in the shower, use a plastic, non-slip stool.  Install grab bars by the toilet and in the tub and shower. Do not use towel bars as grab bars. What can I do in the bedroom?  Make sure that you have a light by your bed that is easy to reach.  Do not use any sheets or blankets that are too big for your bed. They should not hang down onto the floor.  Have a firm chair that has side arms. You can use this for support while you get dressed. What can I do in the kitchen?  Clean up any spills right away.  If you need to reach something above you, use a strong step stool that has a grab bar.  Keep electrical cords out of the way.  Do not use floor polish or wax that makes floors slippery. If you must use wax, use non-skid floor wax. What can I do with my stairs?  Do not leave any items on the stairs.  Make sure that you have a light switch at the top of the stairs and the bottom of the stairs. If you do not have them, ask someone to add them for you.  Make sure that there are handrails on both sides of the stairs, and use them. Fix handrails that are broken or loose. Make sure that handrails are as long as the stairways.  Install non-slip stair treads on all stairs in your home.  Avoid having throw rugs at the top or bottom of the stairs. If you do have throw rugs, attach them to the floor with carpet tape.  Choose a carpet that does not hide the edge of the steps on the stairway.  Check any carpeting to make sure that it is firmly attached to the stairs. Fix any carpet that is loose or worn. What can I do on the outside of my home?  Use bright outdoor  lighting.  Regularly fix the edges of walkways and driveways and fix any cracks.  Remove anything that might make you trip as you walk through a door, such as a raised step or threshold.  Trim any bushes or trees on the path to your home.  Regularly check to see if handrails are loose or broken. Make sure that both sides of any steps have handrails.  Install guardrails along the edges of any raised decks and porches.  Clear walking paths of anything that might make someone trip, such as tools or rocks.  Have any leaves, snow, or ice cleared regularly.  Use sand or salt on walking paths during winter.  Clean up any spills in your garage right away. This includes grease or oil spills. What other actions can I take?  Wear shoes that: ? Have a low heel. Do not wear high heels. ? Have rubber bottoms. ? Are comfortable and fit you well. ? Are closed at the toe. Do not wear open-toe sandals.  Use tools that help you move around (mobility aids) if they are needed. These include: ? Canes. ? Walkers. ? Scooters. ? Crutches.  Review your medicines with your doctor. Some medicines can make you feel dizzy. This can increase your chance of falling. Ask your doctor what other things you can do to help prevent falls. Where to find more information  Centers for Disease Control and Prevention, STEADI: https://garcia.biz/  [employer] on Aging: BrainJudge.co.uk Contact a doctor if:  You are afraid of falling at home.  You feel weak, drowsy, or dizzy at home.  You fall at home. Summary  There are many simple things that you can do to make your home safe and to help prevent falls.  Ways to make your home safe include removing tripping hazards and installing grab bars in the bathroom.  Ask for help when making these changes in your home. This information is not intended to replace advice given to you by your health care provider. Make sure you discuss any questions you  have with your health care provider. Document Revised: 04/30/2018 Document Reviewed: 08/22/2016 Elsevier Patient Education  2020 [employer].

## 2019-12-13 NOTE — Progress Notes (Signed)
I have read the note, and I agree with the clinical assessment and plan.  Kila Godina A. Shenita Trego, MD, PhD, FAAN Certified in Neurology, Clinical Neurophysiology, Sleep Medicine, Pain Medicine and Neuroimaging  [facility] [address], [address] [place], [place] [address] [phone]  

## 2019-12-13 NOTE — Progress Notes (Signed)
Chief Complaint  Patient presents with  . Follow-up    rm 1  . Migraine    pt said she is here to f/u on all of her neurological problems. Pt said she also has vision problems and scan not form words as easy as before. Pt also said she has spinal pain.     HISTORY OF PRESENT ILLNESS: Today 12/13/19  Jordan Waters is a 44 y.o. female here today for follow up for multiple neurologic complaints.   She continues to have an electric sensation "all over" her body. She describes feeling that electric wires are hanging out of her. She has numbness of bilateral feet that comes and goes. She has constant pain. She reports multiple falls. Her husband describes events as a collapse. She doesn't fall when walking but will collapse form standing position. She is usually able to call out for help prior to fall.  She reports that sometimes she can not stand if in a squatting position. She reports that she will drop items that she is holding in her hands. She is taking duloxetine 60mg  daily in the mornings and gabapentin 200mg  TID. This combination seems to help. She continues to have daily headaches. She feels that she is doing better from a standpoint of brain fog. She is able to put words together easier than before. She recently got Covid booster and tolerated well. She is sleeping fairly well. She is now using CBD oil.    HISTORY (copied from previous note)  She is reporting headaches are present daily.   They are bilateral from neck to vertex.   She has mild nausea.   She finds ginger tea helps.   She has photophobia.   She feels the daily headaches are different than her migraines.      Duloxetine and gabapentin help the burning sensation some but does not eliminate the pain.   Flexeril has not helped.     CT of head was normal 04/04/2018.   MRI cervical spine 06/23/2018 was normal for age.   Lumbar puncture showed normal CSF.      From 01/04/2019: At the initial visit, lumbar puncture was  performed due to the headaches, other symptoms and her concern for MS.  The results of the CSF was normal.  We also checked an MRI of the cervical spine.  The spinal cord was normal.  She had mild degenerative changes.  We prescribed duloxetine at the last visit and she felt the combination with gabapentin had helped.  Currently, she is reporting electrical sensations in her shoulders and hips.   Myalgias worsened after she stopped the duloxetine.  She feels the neck is tight and painful.       She is reporting headaches that are bilateral..    She feels these are different than her typical migraine headaches and she does not have photophobia or nausea..  She also notes some fatigue.  Gabapentin was recently restarted at 100 mg po tid (dose she was on in past).    She is also on Naprosyn.    She was diagnosed with uterine cancer and had a hysterectomy 10/2018.     She has had chemo and radiation as well.      REVIEW OF SYSTEMS: Out of a complete 14 system review of symptoms, the patient complains only of the following symptoms, pain, headaches, anxiety, falls and all other reviewed systems are negative.   ALLERGIES: No Known Allergies   HOME MEDICATIONS: Outpatient Medications  Prior to Visit  Medication Sig Dispense Refill  . Ascorbic Acid (VITAMIN C) 100 MG tablet Take 100 mg by mouth daily.    Marland Kitchen conjugated estrogens (PREMARIN) vaginal cream Place 1 Applicatorful vaginally 3 (three) times a week. 42.5 g 12  . ELDERBERRY PO Take 2 each by mouth daily.    Marland Kitchen gabapentin (NEURONTIN) 100 MG capsule Take 2 capsules (200 mg total) by mouth 3 (three) times daily. Final refill 180 capsule 11  . Misc Natural Products (T-RELIEF CBD+13) SUBL Place under the tongue.    . Multiple Vitamin (MULTIVITAMIN WITH MINERALS) TABS tablet Take 1 tablet by mouth daily.    . DULoxetine (CYMBALTA) 60 MG capsule Take 1 capsule (60 mg total) by mouth daily. 30 capsule 11   No facility-administered medications  prior to visit.     PAST MEDICAL HISTORY: Past Medical History:  Diagnosis Date  . Anemia   . Chemotherapy-induced nausea and vomiting    intermittant  . Depression with anxiety   . History of cancer chemotherapy    cervical cancer--- 08-17-2018 to 09-07-2018  . History of external beam radiation therapy    cervical cancer--- 08-13-2018 to 09-08-2018  . Intermittent diarrhea    secondary to radiation therapy  . Malignant neoplasm of overlapping sites of cervix [facility]) oncologist-- dr gorsuch/  dr Sondra Come   dx 07-28-2018--- Stage IB3--  completed chemotherapy 09-07-2018 and IMRT 09-08-2018  . Migraine   . Neuropathic pain   . Pancytopenia, acquired ([place])   . Pelvic mass in female 07/28/2018     PAST SURGICAL HISTORY: Past Surgical History:  Procedure Laterality Date  . IR IMAGING GUIDED PORT INSERTION  08/11/2018  . IR REMOVAL TUN ACCESS W/ PORT W/O FL MOD SED  11/30/2018  . OPERATIVE ULTRASOUND N/A 09/15/2018   Procedure: OPERATIVE ULTRASOUND;  Surgeon: Gery Pray, MD;  Location[facility];  Service: Urology;  Laterality: N/A;  . ROBOTIC ASSISTED TOTAL HYSTERECTOMY WITH BILATERAL SALPINGO OOPHERECTOMY Bilateral 10/29/2018   Procedure: XI ROBOTIC ASSISTED TOTAL HYSTERECTOMY WITH BILATERAL SALPINGO OOPHORECTOMY;  Surgeon: Everitt Amber, MD;  Location: [facility];  Service: Gynecology;  Laterality: Bilateral;  . TANDEM RING INSERTION N/A 09/15/2018   Procedure: TANDEM RING INSERTION;  Surgeon: Gery Pray, MD;  Location: [facility];  Service: Urology;  Laterality: N/A;  . WISDOM TOOTH EXTRACTION       FAMILY HISTORY: Family History  Problem Relation Age of Onset  . Heart disease Paternal Uncle   . Heart disease Paternal Grandfather   . Multiple sclerosis Mother   . Heart disease Father   . Multiple sclerosis Sister      SOCIAL HISTORY: Social History   Socioeconomic History  . Marital status: Married    Spouse name: Leane Para  . Number of  children: 3  . Years of education: 74  . Highest education level: Not on file  Occupational History  . Not on file  Tobacco Use  . Smoking status: Former Smoker    Years: 8.00    Types: Cigarettes    Quit date: 02/15/2018    Years since quitting: 1.8  . Smokeless tobacco: Never Used  Vaping Use  . Vaping Use: Never used  Substance and Sexual Activity  . Alcohol use: Not Currently  . Drug use: Never  . Sexual activity: Yes    Birth control/protection: Other-see comments    Comment: husband ---- vasectomy ;  abstinence since 06/ 2020  Other Topics Concern  . Not on file  Social History Narrative   Lives with husband, children   Caffeine ,limited   Social Determinants of Health   Financial Resource Strain:   . Difficulty of Paying Living Expenses: Not on file  Food Insecurity:   . Worried About Charity fundraiser in the Last Year: Not on file  . Ran Out of Food in the Last Year: Not on file  Transportation Needs:   . Lack of Transportation (Medical): Not on file  . Lack of Transportation (Non-Medical): Not on file  Physical Activity:   . Days of Exercise per Week: Not on file  . Minutes of Exercise per Session: Not on file  Stress:   . Feeling of Stress : Not on file  Social Connections:   . Frequency of Communication with Friends and Family: Not on file  . Frequency of Social Gatherings with Friends and Family: Not on file  . Attends Religious Services: Not on file  . Active Member of Clubs or Organizations: Not on file  . Attends Archivist Meetings: Not on file  . Marital Status: Not on file  Intimate Partner Violence:   . Fear of Current or Ex-Partner: Not on file  . Emotionally Abused: Not on file  . Physically Abused: Not on file  . Sexually Abused: Not on file      PHYSICAL EXAM  Vitals:   12/13/19 1434  BP: 99/64  Pulse: 84   There is no height or weight on file to calculate BMI.   Generalized: Well developed, in no acute  distress  Cardiology: normal rate and rhythm, no murmur auscultated  Respiratory: clear to auscultation bilaterally    Neurological examination  Mentation: Alert oriented to time, place, history taking. Follows all commands speech and language fluent Cranial nerve II-XII: Pupils were equal round reactive to light. Extraocular movements were full, visual field were full on confrontational test. Facial sensation and strength were normal. Head turning and shoulder shrug  were normal and symmetric. Motor: The motor testing reveals 5 over 5 strength of all 4 extremities. Good symmetric motor tone is noted throughout.  Sensory: Sensory testing is intact to soft touch on all 4 extremities. No evidence of extinction is noted.  Coordination: Cerebellar testing reveals good finger-nose-finger and heel-to-shin bilaterally.  Gait and station: Gait is normal.  Reflexes: Deep tendon reflexes are symmetric and normal bilaterally.     DIAGNOSTIC DATA (LABS, IMAGING, TESTING) - I reviewed patient records, labs, notes, testing and imaging myself where available.  Lab Results  Component Value Date   WBC 3.4 (L) 11/30/2018   HGB 12.3 11/30/2018   HCT 37.1 11/30/2018   MCV 97.6 11/30/2018   PLT 235 11/30/2018      Component Value Date/Time   NA 141 10/26/2018 1152   NA 140 03/20/2018 1237   K 4.1 10/26/2018 1152   CL 107 10/26/2018 1152   CO2 26 10/26/2018 1152   GLUCOSE 76 10/26/2018 1152   BUN 6 10/26/2018 1152   BUN 7 03/20/2018 1237   CREATININE 0.65 10/26/2018 1152   CREATININE 0.71 08/13/2018 1614   CALCIUM 9.4 10/26/2018 1152   PROT 7.1 10/26/2018 1152   PROT 7.0 03/20/2018 1237   ALBUMIN 4.4 10/26/2018 1152   ALBUMIN 3.8 04/08/2018 1641   AST 15 10/26/2018 1152   AST 13 (L) 08/13/2018 1614   ALT 14 10/26/2018 1152   ALT 8 08/13/2018 1614   ALKPHOS 43 10/26/2018 1152   BILITOT 0.5 10/26/2018 1152   BILITOT  0.3 08/13/2018 1614   GFRNONAA >60 10/26/2018 1152   GFRNONAA >60  08/13/2018 1614   GFRAA >60 10/26/2018 1152   GFRAA >60 08/13/2018 1614   No results found for: CHOL, HDL, LDLCALC, LDLDIRECT, TRIG, CHOLHDL No results found for: HGBA1C No results found for: VITAMINB12 Lab Results  Component Value Date   TSH 1.465 07/25/2018      ASSESSMENT AND PLAN  44 y.o. year old female  has a past medical history of Anemia, Chemotherapy-induced nausea and vomiting, Depression with anxiety, History of cancer chemotherapy, History of external beam radiation therapy, Intermittent diarrhea, Malignant neoplasm of overlapping sites of cervix [facility]) (oncologist-- dr gorsuch/  dr Sondra Come), Migraine, Neuropathic pain, Pancytopenia, acquired ([place]), and Pelvic mass in female (07/28/2018). here with   Dysesthesia - Plan: DULoxetine (CYMBALTA) 30 MG capsule  Neck pain  Myalgia  Gait disturbance  Depression with anxiety - Plan: DULoxetine (CYMBALTA) 30 MG capsule  Chronic tension-type headache, intractable   Andersen reports that symptoms are fairly stable since last being seen. She feels brain fog has improved. She is no longer experiencing incontinence. She continues to have constant, diffuse electric pain and headaches. Duloxetine and gabapentin help some. We will increase duloxetine to 60mg  in am and 30 mg at bedtime. She will continue gabapentin 200mg  TID. We have discussed PT for gait evaluation, however, it seems that falls are associated with episodes of extreme fatigue. Neurological exam unremarkable. Imaging has been normal. She is sleeping fairly well. She will use CBD cautiously. Will monitor anxiety closely with PCP. Healthy lifestyle habits encouraged. She will follow up with Dr Felecia Shelling in 6 months, sooner if needed.    I spent 20 minutes of face-to-face and non-face-to-face time with patient.  This included previsit chart review, lab review, study review, order entry, electronic health record documentation, patient education.    Debbora Presto, MSN, FNP-C  12/13/2019, 4:07 PM  [facility] [address] [place], [place] [address] [phone]

## 2020-01-13 ENCOUNTER — Other Ambulatory Visit: Payer: Self-pay | Admitting: Neurology

## 2020-01-19 ENCOUNTER — Telehealth: Payer: Self-pay | Admitting: Family Medicine

## 2020-01-19 MED ORDER — GABAPENTIN 100 MG PO CAPS: 200.0000 mg | ORAL_CAPSULE | Freq: Three times a day (TID) | ORAL | 5 refills | Status: DC

## 2020-01-19 NOTE — Telephone Encounter (Signed)
Pt. Called stating she has a problem regarding her medication. Best call back is [phone].

## 2020-01-19 NOTE — Telephone Encounter (Signed)
Called pt and she is asking about refill on gabapentin, there is one more refill.  Spoke to [facility], gabapentin controlled needs 6 month prescription. DONE.

## 2020-02-09 ENCOUNTER — Other Ambulatory Visit: Payer: Self-pay | Admitting: Neurology

## 2020-03-12 NOTE — Progress Notes (Signed)
Radiation Oncology         [phone]) [phone] ________________________________  Name: Jordan Waters MRN: [id number]  Date: 03/13/2020  DOB: 1975-03-18  Follow-Up Visit Note  CC: Esaw Grandchild, NP  Esaw Grandchild, NP    ICD-10-CM   1. Malignant neoplasm of overlapping sites of cervix ([place])  C53.8   2. Cervical mass  N88.8     Diagnosis: Stage IB-3 adenocarcinoma of the cervix  Interval Since Last Radiation: One year, six months, and three days  Radiation Treatment Dates: 07/31/2018 through 09/08/2018 Site Technique Total Dose Dose per Fx Completed Fx Beam Energies  Pelvis: Cervix_pelvis 3D 45/45 1.8 25/25 15X   09/15/2018: Brachytherapy/HDR, 45 tandem/ring system, 68mm tandem / 5.5 Gy in 1 fraction  Narrative:  The patient returns today for follow-up.  She is accompanied by her husband.  She was last seen by Dr. Denman George on 11/23/2019, at which time there was no evidence of recurrence. PAP smear was performed during that visit and showed atrophic vaginitis without intraepithelial lesion or malignancy.   Of note, she continues to follow-up with neurology regarding her brain fog and headaches.  She reports neurologic complications from her Covid 19 infection for which she sees a neurologist. . She continues on Duloxetine and Gabapentin.  On review of systems,  She denies pelvic pain, vaginal bleeding.  She denies any hematuria or rectal bleeding.  She denies any problems with diarrhea. she has resumed intercourse with her husband and denies any postcoital bleeding.  She reports essentially resolution of her abdominal bloating issues.  ALLERGIES:  has No Known Allergies.  Meds: Current Outpatient Medications  Medication Sig Dispense Refill  . Ascorbic Acid (VITAMIN C) 100 MG tablet Take 100 mg by mouth daily.    Marland Kitchen BIOTIN PO Take by mouth.    Marland Kitchen BLACK COHOSH PO Take by mouth.    . conjugated estrogens (PREMARIN) vaginal cream Place 1 Applicatorful vaginally 3 (three) times a week. 42.5  g 12  . Cyanocobalamin (VITAMIN B12 PO) Take by mouth.    . DULoxetine (CYMBALTA) 30 MG capsule Take 2 capsules in the morning and 1 capsule at bedtime. 270 capsule 3  . gabapentin (NEURONTIN) 100 MG capsule Take 2 capsules (200 mg total) by mouth 3 (three) times daily. Final refill 180 capsule 5  . Misc Natural Products (T-RELIEF CBD+13) SUBL Place under the tongue.    . Multiple Vitamin (MULTIVITAMIN WITH MINERALS) TABS tablet Take 1 tablet by mouth daily.    . Probiotic Product (PROBIOTIC PO) Take by mouth.    Marland Kitchen VITAMIN D PO Take by mouth.    . ELDERBERRY PO Take 2 each by mouth daily. (Patient not taking: Reported on 03/13/2020)     No current facility-administered medications for this encounter.    Physical Findings: The patient is in no acute distress. Patient is alert and oriented.  height is 5\' 1"  (1.549 m) and weight is 118 lb 4 oz (53.6 kg). Her temporal temperature is 97 F (36.1 C) (abnormal). Her blood pressure is 105/70 and her pulse is 67. Her respiration is 18 and oxygen saturation is 100%.   Lungs are clear to auscultation bilaterally. Heart has regular rate and rhythm. No palpable cervical, supraclavicular, or axillary adenopathy. Abdomen soft, non-tender, normal bowel sounds. On pelvic examination the external genitalia were unremarkable. A speculum exam was performed. There are no mucosal lesions noted in the vaginal vault. On bimanual and rectovaginal examination there were no pelvic masses appreciated.  Vaginal cuff  intact. rectal sphincter tone normal.  Lab Findings: Lab Results  Component Value Date   WBC 3.4 (L) 11/30/2018   HGB 12.3 11/30/2018   HCT 37.1 11/30/2018   MCV 97.6 11/30/2018   PLT 235 11/30/2018    Radiographic Findings: No results found.  Impression: Stage IB-3 adenocarcinoma of the cervix  No evidence of recurrence on clinical exam today. Recent PAP smear was negative.  Plan: The patient will follow-up with Dr. Denman George in three months and with  radiation oncology in six months.  Total time spent in this encounter was 25 minutes which included reviewing the patient's most recent follow-up with Dr. Denman George, PAP smear, physical examination, and documentation. ____________________________________   Blair Promise, PhD, MD  This document serves as a record of services personally performed by Gery Pray, MD. It was created on his behalf by Clerance Lav, a trained medical scribe. The creation of this record is based on the scribe's personal observations and the provider's statements to them. This document has been checked and approved by the attending provider.

## 2020-03-13 ENCOUNTER — Ambulatory Visit
Admission: RE | Admit: 2020-03-13 | Discharge: 2020-03-13 | Disposition: A | Discharge status: Home / Self Care | Payer: Managed Care, Other (non HMO) | Source: Ambulatory Visit | Attending: Radiation Oncology | Admitting: Radiation Oncology

## 2020-03-13 ENCOUNTER — Encounter: Payer: Self-pay | Admitting: Radiation Oncology

## 2020-03-13 ENCOUNTER — Other Ambulatory Visit: Payer: Self-pay

## 2020-03-13 VITALS — BP 105/70 | HR 67 | Temp 97.0°F | Resp 18 | Ht 61.0 in | Wt 118.2 lb

## 2020-03-13 DIAGNOSIS — Z8542 Personal history of malignant neoplasm of other parts of uterus: Secondary | ICD-10-CM | POA: Insufficient documentation

## 2020-03-13 DIAGNOSIS — Z79899 Other long term (current) drug therapy: Secondary | ICD-10-CM | POA: Insufficient documentation

## 2020-03-13 DIAGNOSIS — C538 Malignant neoplasm of overlapping sites of cervix uteri: Secondary | ICD-10-CM

## 2020-03-13 DIAGNOSIS — Z923 Personal history of irradiation: Secondary | ICD-10-CM | POA: Insufficient documentation

## 2020-03-13 DIAGNOSIS — N888 Other specified noninflammatory disorders of cervix uteri: Secondary | ICD-10-CM

## 2020-03-13 NOTE — Progress Notes (Signed)
Patient is here today for follow up following radiation to cervix completed 09/15/2018.  Patient reports pain but it is related to headaches (known neurological issues).  Patient denies nausea/vomiting.  Denies diarrhea/constipation. Reports some minor irritation in the lower abdominal tightness/discomfort but states it has been like this since surgery.   Denies any bladder issues.  Denies vaginal discharge.  Patient reports sexually active atleast once to twice a week.  States vaginal dilator isn't as comfortable.    Vitals:   03/13/20 1009  BP: 105/70  Pulse: 67  Resp: 18  Temp: (!) 97 F (36.1 C)  TempSrc: Temporal  SpO2: 100%  Weight: 118 lb 4 oz (53.6 kg)  Height: 5\' 1"  (1.549 m)

## 2020-04-11 ENCOUNTER — Telehealth: Payer: Self-pay | Admitting: *Deleted

## 2020-04-11 NOTE — Telephone Encounter (Signed)
Enid Derry from radiation called and scheduled a follow up appt for the patient in May. Enid Derry will call the patient with the appt information

## 2020-04-11 NOTE — Telephone Encounter (Signed)
CALLED PATIENT TO INFORM OF FU APPT. WITH DR. Denman George ON 06-12-20 - ARRIVAL TIME- 1:45 PM , LVM FOR A RETURN CALL

## 2020-06-09 ENCOUNTER — Encounter: Payer: Self-pay | Admitting: Gynecologic Oncology

## 2020-06-12 ENCOUNTER — Inpatient Hospital Stay: Payer: Managed Care, Other (non HMO) | Attending: Gynecologic Oncology | Admitting: Gynecologic Oncology

## 2020-06-12 ENCOUNTER — Other Ambulatory Visit: Payer: Self-pay

## 2020-06-12 VITALS — BP 105/58 | HR 70 | Temp 97.0°F | Resp 20 | Wt 114.4 lb

## 2020-06-12 DIAGNOSIS — Z87891 Personal history of nicotine dependence: Secondary | ICD-10-CM | POA: Insufficient documentation

## 2020-06-12 DIAGNOSIS — C538 Malignant neoplasm of overlapping sites of cervix uteri: Secondary | ICD-10-CM

## 2020-06-12 DIAGNOSIS — Z923 Personal history of irradiation: Secondary | ICD-10-CM | POA: Diagnosis not present

## 2020-06-12 DIAGNOSIS — Z79899 Other long term (current) drug therapy: Secondary | ICD-10-CM | POA: Diagnosis not present

## 2020-06-12 DIAGNOSIS — Z90722 Acquired absence of ovaries, bilateral: Secondary | ICD-10-CM | POA: Diagnosis not present

## 2020-06-12 DIAGNOSIS — Z9221 Personal history of antineoplastic chemotherapy: Secondary | ICD-10-CM | POA: Insufficient documentation

## 2020-06-12 DIAGNOSIS — Z90710 Acquired absence of both cervix and uterus: Secondary | ICD-10-CM | POA: Insufficient documentation

## 2020-06-12 DIAGNOSIS — Z8541 Personal history of malignant neoplasm of cervix uteri: Secondary | ICD-10-CM | POA: Diagnosis not present

## 2020-06-12 NOTE — Progress Notes (Signed)
Follow-up Note: Gyn-Onc Consult was initially requested by Dr. Penny Pia and Dr Kennon Rounds for the evaluation of Jordan Waters 45 y.o. female  CC:  Chief Complaint  Patient presents with  . Malignant neoplasm of overlapping sites of cervix [facility])    Assessment/Plan:  [name]  is a 45 y.o.  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  No evidence of recurrence.  She will follow-up with me in 6 months after she sees Dr Sondra Come in 3 months.  Pap's annually in November.   HPI: [name] is a 45 year old P2 who is seen in consultation at the request of Dr Kennon Rounds for evaluation of a cervical mass and bleeding.  The patient reports the onset of heavy vaginal bleeding since Fathers' Day 2020. This resulted in syncopal feelings on 07/24/18 which led to her visiting the [facility] ER the same day. A TVUS was performed which showed a possible endometrial mass.  An Hb was drawn which showed severe acute blood loss anemia of 7.7mg /dL.  She was admitted, received 2 units PRBC (with an appropriate corresponding rise in Hb to 9.7) and then received an MRI on 07/26/18. This showed the mildly enlarged anteverted uterus measures 10.7 x 5.4 x 5.5 cm. There is a 4.7 x 3.4 x 3.0 cm poorly marginated infiltrative mass centered in the anterior left uterine cervix which extends 12:00 to 4:00 in the lower cervix, and extends 12:00 to 8:00 in the upper cervix, demonstrating intermediate T2 signal intensity and enhancement. This mass demonstrates cervical stromal invasion of at least 6 mm with no frank parametrial invasion. Mass appears to invade the lower most portion of the uterine cavity. No evidence of vaginal or bladder invasion. Findings are worrisome for a FIGO stage IB3 cervical carcinoma.  She had never been vaccinated against HPV.   Biopsy of  the cervix on 07/28/18 showed adenocarcinoma of the cervix.  PET showed no apparent involvement outside of the cervix and uterus.   She commenced primary chemoradiation on 08/04/18 (though chemotherapy was delayed due to lack of port access).   She completed radiation to the pelvis and cervix with Dr. Gery Pray with radiosensitizing chemotherapy completed on September 15, 2018.  She received a single HDR tandem and ring brachytherapy treatment with anticipation for completion hysterectomy given her excellent response to therapy and stage I bulky disease.  The goal of this treatment approach was to minimize long-term toxicity of brachytherapy by debulking the primary site.  MRI was performed following completion of therapy on October 20, 2018 and revealed a uterus measuring 7.1 x 6 4 x 4.7 cm with a small mass in the cervix so showing peripheral contrast-enhancement and a central area of necrosis that is significantly decreased in size.  It currently measures 2.1 x 1.7 cm.  The right and left ovaries were normal.  There is no peritoneal thickening or free fluid or lymphadenopathy appreciated.  On October 29, 2018 she underwent robotic assisted total hysterectomy (extrafascial) with BSO.  Intraoperative findings revealed no gross residual cervical tumor and no suspicious lymph nodes.  Final pathology revealed focal residual adenocarcinoma, post neoadjuvant therapy.  Margins were negative.  Is a grade 2 moderately differentiated tumor.  The origin of the tumor was endocervical.  Post treatment imaging showed some thickening at the vaginal cuff.  Repeat MRI at [facility] on 06/14/19 showed resolution of this asymmetric focus of  enhancement within the left vaginal cuff.   Pap on 11/23/19 of the vaginal cuff showed atrophic vaginitis, no dysplasia or malignancy, negative high risk HPV.  Interval History:  She returns today for scheduled follow-up. She has no symptoms concerning for recurrence.  Current  Meds:  Outpatient Encounter Medications as of 06/12/2020  Medication Sig  . Ascorbic Acid (VITAMIN C) 100 MG tablet Take 100 mg by mouth daily.  Marland Kitchen BIOTIN PO Take by mouth.  Marland Kitchen BLACK COHOSH PO Take by mouth.  . Cyanocobalamin (VITAMIN B12 PO) Take by mouth.  . DULoxetine (CYMBALTA) 30 MG capsule Take 2 capsules in the morning and 1 capsule at bedtime.  Marland Kitchen ELDERBERRY PO Take 2 each by mouth daily.  Marland Kitchen gabapentin (NEURONTIN) 100 MG capsule Take 2 capsules (200 mg total) by mouth 3 (three) times daily. Final refill  . Misc Natural Products (T-RELIEF CBD+13) SUBL Place under the tongue.  . Multiple Vitamin (MULTIVITAMIN WITH MINERALS) TABS tablet Take 1 tablet by mouth daily.  . Probiotic Product (PROBIOTIC PO) Take by mouth.  Marland Kitchen VITAMIN D PO Take by mouth.  . conjugated estrogens (PREMARIN) vaginal cream Place 1 Applicatorful vaginally 3 (three) times a week. (Patient not taking: Reported on 06/09/2020)   No facility-administered encounter medications on file as of 06/12/2020.    Allergy: No Known Allergies  Social Hx:   Social History   Socioeconomic History  . Marital status: Married    Spouse name: Leane Para  . Number of children: 3  . Years of education: 61  . Highest education level: Not on file  Occupational History  . Not on file  Tobacco Use  . Smoking status: Former Smoker    Years: 8.00    Types: Cigarettes    Quit date: 02/15/2018    Years since quitting: 2.3  . Smokeless tobacco: Never Used  Vaping Use  . Vaping Use: Never used  Substance and Sexual Activity  . Alcohol use: Not Currently  . Drug use: Never  . Sexual activity: Yes    Birth control/protection: Other-see comments    Comment: husband ---- vasectomy ;  abstinence since 06/ 2020  Other Topics Concern  . Not on file  Social History Narrative   Lives with husband, children   Caffeine ,limited   Social Determinants of Health   Financial Resource Strain: Not on file  Food Insecurity: Not on file   Transportation Needs: Not on file  Physical Activity: Not on file  Stress: Not on file  Social Connections: Not on file  Intimate Partner Violence: Not on file    Past Surgical Hx:  Past Surgical History:  Procedure Laterality Date  . IR IMAGING GUIDED PORT INSERTION  08/11/2018  . IR REMOVAL TUN ACCESS W/ PORT W/O FL MOD SED  11/30/2018  . OPERATIVE ULTRASOUND N/A 09/15/2018   Procedure: OPERATIVE ULTRASOUND;  Surgeon: Gery Pray, MD;  Location: [facility];  Service: Urology;  Laterality: N/A;  . ROBOTIC ASSISTED TOTAL HYSTERECTOMY WITH BILATERAL SALPINGO OOPHERECTOMY Bilateral 10/29/2018   Procedure: XI ROBOTIC ASSISTED TOTAL HYSTERECTOMY WITH BILATERAL SALPINGO OOPHORECTOMY;  Surgeon: Everitt Amber, MD;  Location: [facility];  Service: Gynecology;  Laterality: Bilateral;  . TANDEM RING INSERTION N/A 09/15/2018   Procedure: TANDEM RING INSERTION;  Surgeon: Gery Pray, MD;  Location[facility];  Service: Urology;  Laterality: N/A;  . WISDOM TOOTH EXTRACTION      Past Medical Hx:  Past Medical History:  Diagnosis Date  . Anemia   .  Chemotherapy-induced nausea and vomiting    intermittant  . Depression with anxiety   . History of cancer chemotherapy    cervical cancer--- 08-17-2018 to 09-07-2018  . History of external beam radiation therapy    cervical cancer--- 08-13-2018 to 09-08-2018  . Intermittent diarrhea    secondary to radiation therapy  . Malignant neoplasm of overlapping sites of cervix [facility]) oncologist-- dr gorsuch/  dr Sondra Come   dx 07-28-2018--- Stage IB3--  completed chemotherapy 09-07-2018 and IMRT 09-08-2018  . Migraine   . Neuropathic pain   . Pancytopenia, acquired ([place])   . Pelvic mass in female 07/28/2018    Past Gynecological History:  See HPI  No LMP recorded. Patient has had a hysterectomy.  Family Hx:  Family History  Problem Relation Age of Onset  . Heart disease Paternal Uncle   . Heart disease Paternal Grandfather    . Multiple sclerosis Mother   . Heart disease Father   . Multiple sclerosis Sister     Review of Systems:  Constitutional  Feels fatigued  ENT Normal appearing ears and nares bilaterally Skin/Breast  No rash, sores, jaundice, itching, dryness Cardiovascular  No chest pain, shortness of breath, or edema  Pulmonary  No cough or wheeze.  Gastro Intestinal  No nausea, vomitting, or diarrhoea. No bright red blood per rectum, no abdominal pain, change in bowel movement, or constipation.  Genito Urinary  No frequency, urgency, dysuria, no bleeding Musculo Skeletal  No myalgia, arthralgia, joint swelling or pain  Neurologic  No weakness, numbness, change in gait,  Psychology  No depression, anxiety, insomnia.   Vitals:  Blood pressure (!) 105/58, pulse 70, temperature (!) 97 F (36.1 C), temperature source Tympanic, resp. rate 20, weight 114 lb 6.4 oz (51.9 kg), SpO2 100 %.  Physical Exam: WD in NAD Neck  Supple NROM, without any enlargements.  Lymph Node Survey No cervical supraclavicular or inguinal adenopathy Cardiovascular  Pulse normal rate, regularity and rhythm. S1 and S2 normal.  Lungs  Clear to auscultation bilateraly, without wheezes/crackles/rhonchi. Good air movement.  Skin  No rash/lesions/breakdown  Psychiatry  Alert and oriented to person, place, and time  Abdomen  Normoactive bowel sounds, abdomen soft, and thin without evidence of hernia. Soft incisions. Tender suprapubic region with fullness. Back No CVA tenderness Genito Urinary  Vulva/vagina: Normal external female genitalia.   No lesions. No discharge  Bladder/urethra:  No lesions or masses, well supported bladder  Vagina: scant dark red blood at top of vagina.Cuff in tact, atrophy present. No lesions or palpable masses.  Rectal  No palpable masses Extremities  No bilateral cyanosis, clubbing or edema.  Thereasa Solo, MD  06/12/2020, 2:33 PM

## 2020-06-12 NOTE — Patient Instructions (Signed)
Please notify Dr Denman George at phone number [phone] if you notice vaginal bleeding, new pelvic or abdominal pains, bloating, feeling full easy, or a change in bladder or bowel function.   Please return to see Dr Denman George in November and after that time she and Dr Sondra Come will see you at 6 monthly intervals.

## 2020-06-13 ENCOUNTER — Ambulatory Visit (INDEPENDENT_AMBULATORY_CARE_PROVIDER_SITE_OTHER): Payer: Managed Care, Other (non HMO) | Admitting: Neurology

## 2020-06-13 ENCOUNTER — Encounter: Payer: Self-pay | Admitting: Neurology

## 2020-06-13 VITALS — BP 119/78 | HR 86 | Wt 110.0 lb

## 2020-06-13 DIAGNOSIS — M2550 Pain in unspecified joint: Secondary | ICD-10-CM | POA: Diagnosis not present

## 2020-06-13 DIAGNOSIS — R208 Other disturbances of skin sensation: Secondary | ICD-10-CM

## 2020-06-13 DIAGNOSIS — G44221 Chronic tension-type headache, intractable: Secondary | ICD-10-CM

## 2020-06-13 DIAGNOSIS — R5383 Other fatigue: Secondary | ICD-10-CM

## 2020-06-13 DIAGNOSIS — M791 Myalgia, unspecified site: Secondary | ICD-10-CM | POA: Diagnosis not present

## 2020-06-13 MED ORDER — GABAPENTIN 300 MG PO CAPS: 300.0000 mg | ORAL_CAPSULE | Freq: Three times a day (TID) | ORAL | 11 refills | Status: DC

## 2020-06-13 NOTE — Progress Notes (Signed)
[facility]  PATIENT: Nikira Kushnir DOB: 01-May-1975  REFERRING DOCTOR OR PCP:  Mina Marble, NP SOURCE: patient, notes from ED (04/04/2018) and PCP, imaging reports, CT images personally reviewed.  _________________________________   HISTORICAL  CHIEF COMPLAINT:  Chief Complaint  Patient presents with  . Follow-up    RM 12 w/ husband. Last seen 12/12/19 by AL,NP. Does not feel any better. Has a lot of inflammation in joints. Wanting to know if she can get an FMRI.      Update 06/13/2020: She is noting pain in the neck and back and feels it is going up into her brain. She reports pain is constant.   She describes it as IT trainer.  Pain is present when she wakes up.    She denies nausea.   She feels dizzy at times and off balanced.   She has photophobia.   She feels the daily headaches are different than her migraines.    She also notes inflammation in her joints.   Hips and knees, elbows and shoulders have pain.    She is eating a plant based low inflammation diet. She also takes viamins  Duloxetine and gabapentin help the burning sensation some but does not eliminate the pain.   Flexeril has not helped.      She has cervical cancer and had surgery 10/2019.     She is reporting headaches that are bilateral..    She feels these are different than her typical migraine headaches and she does not have photophobia or nausea..  She also notes some fatigue.  CT of head was normal 04/04/2018.   MRI cervical spine 06/23/2018 was normal for age.   Lumbar puncture showed normal CSF.      Gabapentin was recently restarted at 100 mg po tid (dose she was on in past).    She is also on Naprosyn.    She was diagnosed with uterine cancer and had a hysterectomy 10/2018.     She has had chemo and radiation as well.     She and her husband asked about functional MRI.  There is a research procedure that is not commonly available at community imaging centers.  I do not think it would  be of benefit in her diagnosis.   From initial evaluation 04/08/2018:  I had the pleasure seeing a patient, [name], at [facility] for neurologic consultation regarding her headaches.  She is a 45 year old woman who has had a daily headache since January.    She has a tight sensation in her head and neck. The neck pain radiates down the spine.  The pain is symmetric.   She has had nausea daily but no vomiting.   She notes photophobia and phonophobia.    Moving her head does not greatly alter the intensity.     She notes pain going into all 4 limbs and she feels all are clumsy.   She has been dropping items.   Her speech is slurred at times.    She denies bladder issues but had incontinence in the office.  She has seen primary care/urgent care and was diagnosed with sinusitis.  She was prescribed amoxicillin and told to take acetaminophen initially.    A dentist noted cracked wisdom teeth and these were pulled without benefit.    Ibuprofen was also prescribed without benefit.   She also saw ENT who saw no evidence of sinusitis.   She went back to urgent care and was prescribed prednisone  and Flexeril without benefit.    She notes diplopia and night sweats 04/03/2018.   She presented to the emergency room 04/04/2018 due to the persistant headache.  She was prescribed NSAIDs.    Yesterday, gabapentin was prescribed.      She has a long history of migraines but they have never persisted.   Triptans gave her nightmares so she has not taken recently.      She reports depression and anxiety, worse since symptoms started.    CT scan of the head and cervical spine was performed and was read as normal.  I personally reviewed the images.  The CT scan of the head was normal.  The CT scan of the cervical spine showed no significant degenerative changes.  REVIEW OF SYSTEMS: Constitutional: No fevers, chills,  or change in appetite.  She reports night sweats. Eyes: No visual changes,  double vision, eye pain Ear, nose and throat: No hearing loss, ear pain, nasal congestion, sore throat Cardiovascular: No chest pain, palpitations Respiratory: No shortness of breath at rest or with exertion.   No wheezes GastrointestinaI: No nausea, vomiting, diarrhea, abdominal pain, fecal incontinence Genitourinary: No dysuria, urinary retention or frequency.  No nocturia.  Although she does not have a history of incontinence, she did have some doing the examination today. Musculoskeletal:See above. Integumentary: No rash, pruritus, skin lesions Neurological: as above Psychiatric: She has depression and anxiety. Endocrine: No palpitations, diaphoresis, change in appetite, change in weigh or increased thirst Hematologic/Lymphatic: No anemia, purpura, petechiae. Allergic/Immunologic: No itchy/runny eyes, nasal congestion, recent allergic reactions, rashes  ALLERGIES: No Known Allergies  HOME MEDICATIONS:  Current Outpatient Medications:  .  Ascorbic Acid (VITAMIN C) 100 MG tablet, Take 100 mg by mouth daily., Disp: , Rfl:  .  BIOTIN PO, Take by mouth., Disp: , Rfl:  .  BLACK COHOSH PO, Take by mouth., Disp: , Rfl:  .  conjugated estrogens (PREMARIN) vaginal cream, Place 1 Applicatorful vaginally 3 (three) times a week., Disp: 42.5 g, Rfl: 12 .  Cyanocobalamin (B-12 PO), Take 1 Dose by mouth daily. Sublingual, Disp: , Rfl:  .  Cyanocobalamin (VITAMIN B12 PO), Take by mouth., Disp: , Rfl:  .  DULoxetine (CYMBALTA) 30 MG capsule, Take 2 capsules in the morning and 1 capsule at bedtime., Disp: 270 capsule, Rfl: 3 .  ELDERBERRY PO, Take 2 each by mouth daily., Disp: , Rfl:  .  Misc Natural Products (T-RELIEF CBD+13) SUBL, Place under the tongue., Disp: , Rfl:  .  Multiple Vitamin (MULTIVITAMIN WITH MINERALS) TABS tablet, Take 1 tablet by mouth daily., Disp: , Rfl:  .  Probiotic Product (PROBIOTIC PO), Take by mouth., Disp: , Rfl:  .  VITAMIN D PO, Take by mouth., Disp: , Rfl:  .   gabapentin (NEURONTIN) 300 MG capsule, Take 1 capsule (300 mg total) by mouth 3 (three) times daily. Final refill, Disp: 90 capsule, Rfl: 11  PAST MEDICAL HISTORY: Past Medical History:  Diagnosis Date  . Anemia   . Chemotherapy-induced nausea and vomiting    intermittant  . Depression with anxiety   . History of cancer chemotherapy    cervical cancer--- 08-17-2018 to 09-07-2018  . History of external beam radiation therapy    cervical cancer--- 08-13-2018 to 09-08-2018  . Intermittent diarrhea    secondary to radiation therapy  . Malignant neoplasm of overlapping sites of cervix [facility]) oncologist-- dr gorsuch/  dr Sondra Come   dx 07-28-2018--- Stage IB3--  completed chemotherapy 09-07-2018 and IMRT 09-08-2018  .  Migraine   . Neuropathic pain   . Pancytopenia, acquired ([place])   . Pelvic mass in female 07/28/2018    PAST SURGICAL HISTORY: Past Surgical History:  Procedure Laterality Date  . IR IMAGING GUIDED PORT INSERTION  08/11/2018  . IR REMOVAL TUN ACCESS W/ PORT W/O FL MOD SED  11/30/2018  . OPERATIVE ULTRASOUND N/A 09/15/2018   Procedure: OPERATIVE ULTRASOUND;  Surgeon: Gery Pray, MD;  Location: [facility];  Service: Urology;  Laterality: N/A;  . ROBOTIC ASSISTED TOTAL HYSTERECTOMY WITH BILATERAL SALPINGO OOPHERECTOMY Bilateral 10/29/2018   Procedure: XI ROBOTIC ASSISTED TOTAL HYSTERECTOMY WITH BILATERAL SALPINGO OOPHORECTOMY;  Surgeon: Everitt Amber, MD;  Location: [facility];  Service: Gynecology;  Laterality: Bilateral;  . TANDEM RING INSERTION N/A 09/15/2018   Procedure: TANDEM RING INSERTION;  Surgeon: Gery Pray, MD;  [facility];  Service: Urology;  Laterality: N/A;  . WISDOM TOOTH EXTRACTION      FAMILY HISTORY: Family History  Problem Relation Age of Onset  . Heart disease Paternal Uncle   . Heart disease Paternal Grandfather   . Multiple sclerosis Mother   . Heart disease Father   . Multiple sclerosis Sister     SOCIAL  HISTORY:  Social History   Socioeconomic History  . Marital status: Married    Spouse name: Leane Para  . Number of children: 3  . Years of education: 42  . Highest education level: Not on file  Occupational History  . Not on file  Tobacco Use  . Smoking status: Former Smoker    Years: 8.00    Types: Cigarettes    Quit date: 02/15/2018    Years since quitting: 2.3  . Smokeless tobacco: Never Used  Vaping Use  . Vaping Use: Never used  Substance and Sexual Activity  . Alcohol use: Not Currently  . Drug use: Never  . Sexual activity: Yes    Birth control/protection: Other-see comments    Comment: husband ---- vasectomy ;  abstinence since 06/ 2020  Other Topics Concern  . Not on file  Social History Narrative   Lives with husband, children   Caffeine ,limited   Social Determinants of Health   Financial Resource Strain: Not on file  Food Insecurity: Not on file  Transportation Needs: Not on file  Physical Activity: Not on file  Stress: Not on file  Social Connections: Not on file  Intimate Partner Violence: Not on file    _______________________________ PHYSICAL EXAM  Vitals:   06/13/20 1450  BP: 119/78  Pulse: 86  SpO2: 98%  Weight: 110 lb (49.9 kg)    Body mass index is 20.78 kg/m.   General: The patient is well-developed and well-nourished and in no acute distress.  Head is [place]/AT  Neck: The neck is supple.  She has tenderness over the cervical paraspinal muscles, trapezius and other neck muscles.  Cardiovascular: The heart has a regular rate and rhythm with a normal S1 and S2. There were no murmurs, gallops or rubs.   Skin: Extremities are without rash or  edema.  Musculoskeletal: She has tenderness over some of the fibromyalgia tender points including the upper chest, upper back and trochanteric bursae  Neurologic Exam  Mental status: The patient is alert and oriented x 3 at the time of the examination. The patient has apparent normal recent and  remote memory, with an apparently normal attention span and concentration ability.   Speech is normal.  Cranial nerves: Extraocular movements are full.  Facial strength  and sensation was normal.  No obvious hearing deficits are noted.  Motor:  Muscle bulk is normal.   Tone is normal. Strength is  5 / 5 in all 4 extremities.   Sensory: Sensory testing is intact to pinprick, soft touch and vibration sensation in all 4 extremities.  Coordination: Cerebellar testing reveals good finger-nose-finger and heel-to-shin bilaterally.  Gait and station: Station is normal.   Gait is mildly arthritic.. Tandem gait is normal. Romberg is negative.   Reflexes: Deep tendon reflexes are symmetric and normal bilaterally.   Plantar responses are flexor.   DIAGNOSTIC DATA (LABS, IMAGING, TESTING) - I reviewed patient records, labs, notes, testing and imaging myself where available.  Lab Results  Component Value Date   WBC 3.4 (L) 11/30/2018   HGB 12.3 11/30/2018   HCT 37.1 11/30/2018   MCV 97.6 11/30/2018   PLT 235 11/30/2018      Component Value Date/Time   NA 141 10/26/2018 1152   NA 140 03/20/2018 1237   K 4.1 10/26/2018 1152   CL 107 10/26/2018 1152   CO2 26 10/26/2018 1152   GLUCOSE 76 10/26/2018 1152   BUN 6 10/26/2018 1152   BUN 7 03/20/2018 1237   CREATININE 0.65 10/26/2018 1152   CREATININE 0.71 08/13/2018 1614   CALCIUM 9.4 10/26/2018 1152   PROT 7.1 10/26/2018 1152   PROT 7.0 03/20/2018 1237   ALBUMIN 4.4 10/26/2018 1152   ALBUMIN 3.8 04/08/2018 1641   AST 15 10/26/2018 1152   AST 13 (L) 08/13/2018 1614   ALT 14 10/26/2018 1152   ALT 8 08/13/2018 1614   ALKPHOS 43 10/26/2018 1152   BILITOT 0.5 10/26/2018 1152   BILITOT 0.3 08/13/2018 1614   GFRNONAA >60 10/26/2018 1152   GFRNONAA >60 08/13/2018 1614   GFRAA >60 10/26/2018 1152   GFRAA >60 08/13/2018 1614    Lab Results  Component Value Date   TSH 1.465 07/25/2018   _________________________________________  Myalgia  - Plan: ANA w/Reflex, Sedimentation rate, C-reactive protein, Rheumatoid factor, CK  Multiple joint pain - Plan: ANA w/Reflex, Sedimentation rate, C-reactive protein, Rheumatoid factor, CK  Dysesthesia - Plan: ANA w/Reflex, Sedimentation rate, C-reactive protein, Rheumatoid factor  Chronic tension-type headache, intractable  Fatigue, unspecified type  1.   She has new symptoms recording joint pain, muscle ache and worsening electric-like pain.  We will check ESR, CRP, RF, ANA.   2.   To help with the pain, gabapentin will be increased. 3.   Continue medications for headaches 4.   rtc 6 months, sooner if problems.     Rhiann Boucher A. Felecia Shelling, MD, PhD, FAAN Certified in Neurology, Clinical Neurophysiology, Sleep Medicine, Pain Medicine and Neuroimaging Director, [facility] [address] [place], [place] [address] [phone]

## 2020-06-14 LAB — ANA W/REFLEX: Anti Nuclear Antibody (ANA): NEGATIVE

## 2020-06-14 LAB — RHEUMATOID FACTOR: Rheumatoid fact SerPl-aCnc: <10 IU/mL (ref <14.0)

## 2020-06-14 LAB — SEDIMENTATION RATE: Sed Rate: 9 mm/hr (ref 0–32)

## 2020-06-14 LAB — CK: Total CK: 63 U/L (ref 32–182)

## 2020-06-14 LAB — C-REACTIVE PROTEIN: CRP: <1 mg/L (ref 0–10)

## 2020-07-21 ENCOUNTER — Encounter: Payer: Self-pay | Admitting: Hematology and Oncology

## 2020-09-06 ENCOUNTER — Encounter: Payer: Self-pay | Admitting: Radiology

## 2020-09-08 NOTE — Progress Notes (Signed)
Radiation Oncology         [phone] ________________________________  Name: Jordan Waters MRN: [id number]  Date: 09/11/2020  DOB: April 15, 1975  Follow-Up Visit Note  CC: Pcp, No  Danford, Katy D, NP    ICD-10-CM   1. Malignant neoplasm of overlapping sites of cervix ([place])  C53.8     2. Cervical mass  N88.8       Diagnosis: Stage IB-3 adenocarcinoma of the cervix  [name]  is a 45 y.o.  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  Interval Since Last Radiation:  2  years and 3 days   Radiation Treatment Dates: 07/31/2018 through 09/08/2018 Site Technique Total Dose Dose per Fx Completed Fx Beam Energies  Pelvis: Cervix_pelvis 3D 45/45 1.8 25/25 15X   09/15/2018: Brachytherapy/HDR, 45 tandem/ring system, 60 mm tandem / 5.5 Gy in 1 fraction  Narrative:  The patient returns today for routine 6 month follow-up, she was last seen by me on 03/13/20.       Since then, the patient followed up with Dr. Denman George on 06/12/20. During this visit, the patient was noted to exhibit no evidence of disease recurrence.  She is having significant neurological issues and seeing a neurologist on a regular basis concerning this issue.  Patient reports having COVID and reports that her symptoms may be "long COVID".  She denies any pelvic pain.  She reports chronic abdominal bloating since her surgery.  She has some diarrhea but manages this with dietary changes.  She is hesitant to take additional medications.  She denies any vaginal bleeding.  She reports intercourse approximately 3 times per week with her husband.  She denies any postcoital bleeding.                          Allergies:  has No Known Allergies.  Meds: Current Outpatient Medications  Medication Sig Dispense Refill   Ascorbic Acid (VITAMIN C) 100 MG tablet Take 100  mg by mouth daily.     BIOTIN PO Take by mouth.     BLACK COHOSH PO Take by mouth.     conjugated estrogens (PREMARIN) vaginal cream Place 1 Applicatorful vaginally 3 (three) times a week. 42.5 g 12   Cyanocobalamin (B-12 PO) Take 1 Dose by mouth daily. Sublingual     Cyanocobalamin (VITAMIN B12 PO) Take by mouth.     DULoxetine (CYMBALTA) 30 MG capsule Take 2 capsules in the morning and 1 capsule at bedtime. 270 capsule 3   ELDERBERRY PO Take 2 each by mouth daily.     gabapentin (NEURONTIN) 300 MG capsule Take 1 capsule (300 mg total) by mouth 3 (three) times daily. Final refill 90 capsule 11   Misc Natural Products (T-RELIEF CBD+13) SUBL Place under the tongue.     Multiple Vitamin (MULTIVITAMIN WITH MINERALS) TABS tablet Take 1 tablet by mouth daily.     Probiotic Product (PROBIOTIC PO) Take by mouth.     VITAMIN Waters PO Take by mouth.     No current facility-administered medications for this encounter.    Physical Findings: The patient is in no acute distress. Patient is alert and oriented.  Accompanied by her husband on evaluation today  weight is 114 lb (51.7 kg). Her temperature is 97.3 F (36.3 C) (abnormal). Her blood pressure is 106/62 and her pulse is 63. Her respiration  is 20 and oxygen saturation is 99%. .  No significant changes. Lungs are clear to auscultation bilaterally. Heart has regular rate and rhythm. No palpable cervical, supraclavicular, or axillary adenopathy. Abdomen soft, non-tender, normal bowel sounds.  On pelvic examination the external genitalia were unremarkable. A speculum exam was performed. There are no mucosal lesions noted in the vaginal vault.  On bimanual and rectovaginal examination there were no pelvic masses appreciated.  Vaginal cuff intact.  Rectal sphincter tone good.    Lab Findings: Lab Results  Component Value Date   WBC 3.4 (L) 11/30/2018   HGB 12.3 11/30/2018   HCT 37.1 11/30/2018   MCV 97.6 11/30/2018   PLT 235 11/30/2018     Radiographic Findings: No results found.  Impression:  Stage IB-3 adenocarcinoma of the cervix  No evidence of recurrence on clinical exam today.  Patient does report frequent problems with diarrhea but manages with dietary changes.  Plan: Patient will follow up with Dr. Denman George in late September.  She is due for Pap smear in the fall.  Routine follow-up in radiation oncology in 6 months.   25 minutes of total time was spent for this patient encounter, including preparation, face-to-face counseling with the patient and coordination of care, physical exam, and documentation of the encounter. ____________________________________  Blair Promise, PhD, MD   This document serves as a record of services personally performed by Gery Pray, MD. It was created on his behalf by Roney Mans, a trained medical scribe. The creation of this record is based on the scribe's personal observations and the provider's statements to them. This document has been checked and approved by the attending provider.

## 2020-09-11 ENCOUNTER — Ambulatory Visit
Admission: RE | Admit: 2020-09-11 | Discharge: 2020-09-11 | Disposition: A | Discharge status: Home / Self Care | Payer: Managed Care, Other (non HMO) | Source: Ambulatory Visit | Attending: Radiation Oncology | Admitting: Radiation Oncology

## 2020-09-11 ENCOUNTER — Other Ambulatory Visit: Payer: Self-pay

## 2020-09-11 ENCOUNTER — Encounter: Payer: Self-pay | Admitting: Radiation Oncology

## 2020-09-11 VITALS — BP 106/62 | HR 63 | Temp 97.3°F | Resp 20 | Wt 114.0 lb

## 2020-09-11 DIAGNOSIS — Z79899 Other long term (current) drug therapy: Secondary | ICD-10-CM | POA: Diagnosis not present

## 2020-09-11 DIAGNOSIS — Z923 Personal history of irradiation: Secondary | ICD-10-CM | POA: Diagnosis not present

## 2020-09-11 DIAGNOSIS — Z8542 Personal history of malignant neoplasm of other parts of uterus: Secondary | ICD-10-CM | POA: Diagnosis not present

## 2020-09-11 DIAGNOSIS — Z9221 Personal history of antineoplastic chemotherapy: Secondary | ICD-10-CM | POA: Diagnosis not present

## 2020-09-11 DIAGNOSIS — C538 Malignant neoplasm of overlapping sites of cervix uteri: Secondary | ICD-10-CM

## 2020-09-11 DIAGNOSIS — N888 Other specified noninflammatory disorders of cervix uteri: Secondary | ICD-10-CM

## 2020-09-11 DIAGNOSIS — R197 Diarrhea, unspecified: Secondary | ICD-10-CM | POA: Insufficient documentation

## 2020-09-11 DIAGNOSIS — Z90722 Acquired absence of ovaries, bilateral: Secondary | ICD-10-CM | POA: Insufficient documentation

## 2020-09-11 NOTE — Progress Notes (Signed)
Jordan Waters is here today for follow up post radiation to the pelvic.  They completed their radiation on: 09/15/18  Does the patient complain of any of the following:  Pain:Patient reports having chronic pain.  Abdominal bloating: yes Diarrhea/Constipation: diarrhea  Nausea/Vomiting: no Vaginal Discharge: no Blood in Urine or Stool: no Urinary Issues (dysuria/incomplete emptying/ incontinence/ increased frequency/urgency): Patient reports incomplete emptying of bladder. Does patient report using vaginal dilator 2-3 times a week and/or sexually active 2-3 weeks:  Patient reports being sexually active 3 times week.  Post radiation skin changes: intact   Additional comments if applicable:   Vitals:   09/11/20 1043  BP: 106/62  Pulse: 63  Resp: 20  Temp: (!) 97.3 F (36.3 C)  SpO2: 99%  Weight: 114 lb (51.7 kg)

## 2020-09-12 ENCOUNTER — Telehealth: Payer: Self-pay | Admitting: Oncology

## 2020-09-12 NOTE — Telephone Encounter (Signed)
Called Jordan Waters and rescheduled apt with Dr. Denman George to 10/18/20 per patient request.

## 2020-09-29 ENCOUNTER — Telehealth: Payer: Self-pay | Admitting: *Deleted

## 2020-09-29 NOTE — Telephone Encounter (Signed)
Called and moved patient's from 9/28 to 9/26

## 2020-10-12 ENCOUNTER — Encounter: Payer: Self-pay | Admitting: Gynecologic Oncology

## 2020-10-16 ENCOUNTER — Other Ambulatory Visit: Payer: Self-pay

## 2020-10-16 ENCOUNTER — Inpatient Hospital Stay: Payer: Managed Care, Other (non HMO) | Attending: Gynecologic Oncology | Admitting: Gynecologic Oncology

## 2020-10-16 VITALS — BP 107/62 | HR 73 | Temp 97.7°F | Resp 16 | Ht 61.0 in | Wt 112.1 lb

## 2020-10-16 DIAGNOSIS — C538 Malignant neoplasm of overlapping sites of cervix uteri: Secondary | ICD-10-CM

## 2020-10-16 DIAGNOSIS — Z90722 Acquired absence of ovaries, bilateral: Secondary | ICD-10-CM | POA: Diagnosis not present

## 2020-10-16 DIAGNOSIS — Z923 Personal history of irradiation: Secondary | ICD-10-CM | POA: Diagnosis not present

## 2020-10-16 DIAGNOSIS — Z9221 Personal history of antineoplastic chemotherapy: Secondary | ICD-10-CM | POA: Insufficient documentation

## 2020-10-16 DIAGNOSIS — Z90710 Acquired absence of both cervix and uterus: Secondary | ICD-10-CM | POA: Insufficient documentation

## 2020-10-16 NOTE — Patient Instructions (Signed)
Please notify Dr Denman George at phone number [phone] if you notice vaginal bleeding, new pelvic or abdominal pains, bloating, feeling full easy, or a change in bladder or bowel function.   Dr Denman George is departing the [facility] in October, 2022. Her partners and colleagues including Dr Berline Lopes, Dr Delsa Sale and [name], Nurse Practitioner will be available to continue your care.   You are next scheduled to return to the Gynecologic Oncology office at the [facility] in March with Dr Sondra Come and in September with Dr Berline Lopes. Please call [phone] in March after your appointment with Dr Sondra Come to request an appointment for September with Dr Serita Grit partner.

## 2020-10-16 NOTE — Progress Notes (Signed)
Follow-up Note: Gyn-Onc Consult was initially requested by Dr. Penny Pia and Dr Kennon Rounds for the evaluation of Jordan Waters 45 y.o. female  CC:  Chief Complaint  Patient presents with   Malignant neoplasm of overlapping sites of cervix [facility])    Assessment/Plan:  Jordan. Jordan Waters  is a 45 y.o.  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  No evidence of recurrence.  She will follow-up with Dr Sondra Come in 6 months and my partner Dr Berline Lopes in 12 months.  Pap's annually in November.   HPI: Jordan Waters is a 45 year old P2 who is seen in consultation at the request of Dr Kennon Rounds for evaluation of a cervical mass and bleeding.   The patient reports the onset of heavy vaginal bleeding since Fathers' Day 2020. This resulted in syncopal feelings on 07/24/18 which led to her visiting the [facility] ER the same day. A TVUS was performed which showed a possible endometrial mass.   An Hb was drawn which showed severe acute blood loss anemia of 7.7mg /dL.   She was admitted, received 2 units PRBC (with an appropriate corresponding rise in Hb to 9.7) and then received an MRI on 07/26/18. This showed the mildly enlarged anteverted uterus measures 10.7 x 5.4 x 5.5 cm. There is a 4.7 x 3.4 x 3.0 cm poorly marginated infiltrative mass centered in the anterior left uterine cervix which extends 12:00 to 4:00 in the lower cervix, and extends 12:00 to 8:00 in the upper cervix, demonstrating intermediate T2 signal intensity and enhancement. This mass demonstrates cervical stromal invasion of at least 6 mm with no frank parametrial invasion. Mass appears to invade the lower most portion of the uterine cavity. No evidence of vaginal or bladder invasion. Findings are worrisome for a FIGO stage IB3 cervical carcinoma.   She had never been vaccinated against HPV.    Biopsy of the cervix on 07/28/18 showed adenocarcinoma of the cervix.  PET showed no apparent involvement outside of the cervix and uterus.   She commenced primary chemoradiation on 08/04/18 (though chemotherapy was delayed due to lack of port access).   She completed radiation to the pelvis and cervix with Dr. Gery Pray with radiosensitizing chemotherapy completed on September 15, 2018.  She received a single HDR tandem and ring brachytherapy treatment with anticipation for completion hysterectomy given her excellent response to therapy and stage I bulky disease.  The goal of this treatment approach was to minimize long-term toxicity of brachytherapy by debulking the primary site.  MRI was performed following completion of therapy on October 20, 2018 and revealed a uterus measuring 7.1 x 6 4 x 4.7 cm with a small mass in the cervix so showing peripheral contrast-enhancement and a central area of necrosis that is significantly decreased in size.  It currently measures 2.1 x 1.7 cm.  The right and left ovaries were normal.  There is no peritoneal thickening or free fluid or lymphadenopathy appreciated.  On October 29, 2018 she underwent robotic assisted total hysterectomy (extrafascial) with BSO.  Intraoperative findings revealed no gross residual cervical tumor and no suspicious lymph nodes.  Final pathology revealed focal residual adenocarcinoma, post neoadjuvant therapy.  Margins were negative.  Is a grade 2 moderately differentiated tumor.  The origin of the tumor was endocervical.  Post treatment imaging showed some thickening at the vaginal cuff.  Repeat MRI at [place] on 06/14/19 showed resolution  of this asymmetric focus of enhancement within the left vaginal cuff.   Pap on 11/23/19 of the vaginal cuff showed atrophic vaginitis, no dysplasia or malignancy, negative high risk HPV.  Interval History:  She returns today for scheduled follow-up. She has no symptoms concerning for  recurrence.  Current Meds:  Outpatient Encounter Medications as of 10/16/2020  Medication Sig   Ascorbic Acid (VITAMIN C) 100 MG tablet Take 100 mg by mouth daily.   BIOTIN PO Take by mouth.   BLACK COHOSH PO Take by mouth.   conjugated estrogens (PREMARIN) vaginal cream Place 1 Applicatorful vaginally 3 (three) times a week.   Cyanocobalamin (B-12 PO) Take 1 Dose by mouth daily. Sublingual   Cyanocobalamin (VITAMIN B12 PO) Take by mouth.   DULoxetine (CYMBALTA) 30 MG capsule Take 2 capsules in the morning and 1 capsule at bedtime.   ELDERBERRY PO Take 2 each by mouth daily.   gabapentin (NEURONTIN) 100 MG capsule Take 100 mg by mouth 3 (three) times daily.   gabapentin (NEURONTIN) 300 MG capsule Take 1 capsule (300 mg total) by mouth 3 (three) times daily. Final refill   Misc Natural Products (T-RELIEF CBD+13) SUBL Place under the tongue.   Multiple Vitamin (MULTIVITAMIN WITH MINERALS) TABS tablet Take 1 tablet by mouth daily.   Omega-3 Fatty Acids (FISH OIL BURP-LESS PO) Take by mouth.   Probiotic Product (PROBIOTIC PO) Take by mouth.   VITAMIN D PO Take by mouth.   No facility-administered encounter medications on file as of 10/16/2020.    Allergy: No Known Allergies  Social Hx:   Social History   Socioeconomic History   Marital status: Married    Spouse name: Leane Para   Number of children: 3   Years of education: 12   Highest education level: Not on file  Occupational History   Not on file  Tobacco Use   Smoking status: Former    Years: 8.00    Types: Cigarettes    Quit date: 02/15/2018    Years since quitting: 2.6   Smokeless tobacco: Never  Vaping Use   Vaping Use: Never used  Substance and Sexual Activity   Alcohol use: Not Currently   Drug use: Never   Sexual activity: Yes    Birth control/protection: Other-see comments    Comment: husband ---- vasectomy ;  abstinence since 06/ 2020  Other Topics Concern   Not on file  Social History Narrative   Lives with  husband, children   Caffeine ,limited   Social Determinants of Health   Financial Resource Strain: Not on file  Food Insecurity: Not on file  Transportation Needs: Not on file  Physical Activity: Not on file  Stress: Not on file  Social Connections: Not on file  Intimate Partner Violence: Not on file    Past Surgical Hx:  Past Surgical History:  Procedure Laterality Date   IR IMAGING GUIDED PORT INSERTION  08/11/2018   IR REMOVAL TUN ACCESS W/ PORT W/O FL MOD SED  11/30/2018   OPERATIVE ULTRASOUND N/A 09/15/2018   Procedure: OPERATIVE ULTRASOUND;  Surgeon: Gery Pray, MD;  Location: [place];  Service: Urology;  Laterality: N/A;   ROBOTIC ASSISTED TOTAL HYSTERECTOMY WITH BILATERAL SALPINGO OOPHERECTOMY Bilateral 10/29/2018   Procedure: XI ROBOTIC ASSISTED TOTAL HYSTERECTOMY WITH BILATERAL SALPINGO OOPHORECTOMY;  Surgeon: Everitt Amber, MD;  Location: [facility];  Service: Gynecology;  Laterality: Bilateral;   TANDEM RING INSERTION N/A 09/15/2018   Procedure: TANDEM RING INSERTION;  Surgeon: Gery Pray, MD;  Location: [place];  Service: Urology;  Laterality: N/A;   WISDOM TOOTH EXTRACTION      Past Medical Hx:  Past Medical History:  Diagnosis Date   Anemia    Chemotherapy-induced nausea and vomiting    intermittant   Depression with anxiety    History of cancer chemotherapy    cervical cancer--- 08-17-2018 to 09-07-2018   History of external beam radiation therapy    cervical cancer--- 07-31-2018 to 09-08-2018   History of radiation therapy 09/15/2018   vaginal Tandem and Ring HDR 09/15/2018 Dr Gery Pray   Intermittent diarrhea    secondary to radiation therapy   Malignant neoplasm of overlapping sites of cervix [facility]) oncologist-- dr gorsuch/  dr Sondra Come   dx 07-28-2018--- Stage IB3--  completed chemotherapy 09-07-2018 and IMRT 09-08-2018   Migraine    Neuropathic pain    Pancytopenia, acquired ([place])    Pelvic mass in female 07/28/2018     Past Gynecological History:  See HPI  No LMP recorded. Patient has had a hysterectomy.  Family Hx:  Family History  Problem Relation Age of Onset   Heart disease Paternal Uncle    Heart disease Paternal Grandfather    Multiple sclerosis Mother    Heart disease Father    Multiple sclerosis Sister     Review of Systems:  Constitutional  Feels fatigued  ENT Normal appearing ears and nares bilaterally Skin/Breast  No rash, sores, jaundice, itching, dryness Cardiovascular  No chest pain, shortness of breath, or edema  Pulmonary  No cough or wheeze.  Gastro Intestinal  No nausea, vomitting, or diarrhoea. No bright red blood per rectum, no abdominal pain, change in bowel movement, or constipation.  Genito Urinary  No frequency, urgency, dysuria, no bleeding Musculo Skeletal  No myalgia, arthralgia, joint swelling or pain  Neurologic  No weakness, numbness, change in gait,  Psychology  No depression, anxiety, insomnia.   Vitals:  Blood pressure 107/62, pulse 73, temperature 97.7 F (36.5 C), temperature source Oral, resp. rate 16, height 5\' 1"  (1.549 m), weight 112 lb 1.6 oz (50.8 kg), SpO2 100 %.  Physical Exam: WD in NAD Neck  Supple NROM, without any enlargements.  Lymph Node Survey No cervical supraclavicular or inguinal adenopathy Cardiovascular  Well perfused peripheries Lungs  No increased WOB Skin  No rash/lesions/breakdown  Psychiatry  Alert and oriented to person, place, and time  Abdomen  Normoactive bowel sounds, abdomen soft, and thin without evidence of hernia. Soft incisions.  Back No CVA tenderness Genito Urinary  Vulva/vagina: Normal external female genitalia.   No lesions. No discharge  Bladder/urethra:  No lesions or masses, well supported bladder  Vagina: Cuff smooth, no masses, no parametrial disease Rectal  No palpable masses Extremities  No bilateral cyanosis, clubbing or edema.  Thereasa Solo, MD  10/16/2020, 9:24 AM

## 2020-10-18 ENCOUNTER — Ambulatory Visit: Payer: Managed Care, Other (non HMO) | Admitting: Gynecologic Oncology

## 2020-11-15 ENCOUNTER — Ambulatory Visit: Payer: Self-pay | Admitting: Radiation Oncology

## 2020-11-21 ENCOUNTER — Ambulatory Visit: Payer: Managed Care, Other (non HMO) | Admitting: Gynecologic Oncology

## 2020-11-29 ENCOUNTER — Ambulatory Visit: Payer: Managed Care, Other (non HMO) | Admitting: Gynecologic Oncology

## 2020-12-05 ENCOUNTER — Other Ambulatory Visit: Payer: Self-pay

## 2020-12-05 DIAGNOSIS — R208 Other disturbances of skin sensation: Secondary | ICD-10-CM

## 2020-12-05 DIAGNOSIS — F418 Other specified anxiety disorders: Secondary | ICD-10-CM

## 2020-12-05 MED ORDER — DULOXETINE HCL 30 MG PO CPEP: ORAL_CAPSULE | ORAL | 0 refills | Status: DC

## 2021-03-02 ENCOUNTER — Other Ambulatory Visit: Payer: Self-pay | Admitting: Neurology

## 2021-03-02 DIAGNOSIS — R208 Other disturbances of skin sensation: Secondary | ICD-10-CM

## 2021-03-02 DIAGNOSIS — F418 Other specified anxiety disorders: Secondary | ICD-10-CM

## 2021-03-15 ENCOUNTER — Ambulatory Visit: Payer: Self-pay | Admitting: Radiation Oncology

## 2021-03-16 NOTE — Progress Notes (Signed)
Jordan Waters is here today for follow up post radiation to the pelvis.  They completed their radiation on: 09/15/2018   Does the patient complain of any of the following:  Pain:denies Abdominal bloating: denies Diarrhea/Constipation: diarrhea that is improving with probiotic use Nausea/Vomiting: comes and goes due to gabapentin Vaginal Discharge: denies Blood in Urine or Stool: denies Urinary Issues (dysuria/incomplete emptying/ incontinence/ increased frequency/urgency): incomplete bladder emptying, frequency, urgency Does patient report using vaginal dilator 2-3 times a week and/or sexually active 2-3 weeks: no Post radiation skin changes: "cottage cheese look" under the skin of pelvic area.   Additional comments if applicable: dizziness, hot flashes  Vitals:   03/19/21 1041  BP: 105/66  Pulse: 87  Resp: 18  Temp: 97.6 F (36.4 C)  SpO2: 100%  Weight: 110 lb 3.2 oz (50 kg)  Height: 5' 0.75" (1.543 m)

## 2021-03-18 NOTE — Progress Notes (Signed)
Radiation Oncology         [phone]) [phone] ________________________________  Name: Jordan Waters MRN: [id number]  Date: 03/19/2021  DOB: [birth year]  Follow-Up Visit Note  CC: Pcp, No  Danford, Katy D, NP    ICD-10-CM   1. Malignant neoplasm of overlapping sites of cervix ([place])  C53.8       Diagnosis: Stage IB-3 adenocarcinoma of the cervix; s/p primary chemoradiation completed 09/21/18, and extrafascial hysterectomy on 10/29/2018 with minimal (focal) residual carcinoma.  Interval Since Last Radiation: 2 years, 6 months, and 2 days   Radiation Treatment Dates: 07/31/2018 through 09/08/2018 Site Technique Total Dose Dose per Fx Completed Fx Beam Energies  Pelvis: Cervix_pelvis 3D 45/45 1.8 25/25 15X    09/15/2018: Brachytherapy/HDR, 45 tandem/ring system, 60 mm tandem / 5.5 Gy in 1 fraction   Narrative:  The patient returns today for routine 6 month follow-up, she was last seen here for follow-up on 09/11/20. Since her last visit, the patient met with Dr. Denman George on 10/16/20. During which time, the patient denied any symptoms concerning for disease recurrence, and was noted as NED on examination.       Otherwise, no significant interval history since the patient was last seen.  Today she reports her diarrhea is much improved.  She has been aggressive with her probiotic therapy which she feels has helped.  She also realizes she is more intolerant of milk products since receiving radiation therapy.  She notes which foods to avoid at this time to bring on episodes of diarrhea.  She denies any abdominal bloating pelvic pain or vaginal bleeding.  She denies any postcoital bleeding hematuria or rectal bleeding.  She does report some mild discomfort in the upper abdominal area when doing abdominal exercises and wonders if maybe she has some scar tissue in this area from her surgery.                         Allergies:  has No Known Allergies.  Meds: Current Outpatient Medications  Medication Sig  Dispense Refill   Ascorbic Acid (VITAMIN C) 100 MG tablet Take 100 mg by mouth daily.     BIOTIN PO Take by mouth.     BLACK COHOSH PO Take by mouth.     conjugated estrogens (PREMARIN) vaginal cream Place 1 Applicatorful vaginally 3 (three) times a week. 42.5 g 12   Cyanocobalamin (B-12 PO) Take 1 Dose by mouth daily. Sublingual     DULoxetine (CYMBALTA) 30 MG capsule Take 2 capsules in the morning and 1 capsule at bedtime. Please call and make overdue appt for further refills 270 capsule 0   ELDERBERRY PO Take 2 each by mouth daily.     gabapentin (NEURONTIN) 300 MG capsule Take 1 capsule (300 mg total) by mouth 3 (three) times daily. Final refill 90 capsule 11   Misc Natural Products (T-RELIEF CBD+13) SUBL Place under the tongue.     Multiple Vitamin (MULTIVITAMIN WITH MINERALS) TABS tablet Take 1 tablet by mouth daily.     Omega-3 Fatty Acids (FISH OIL BURP-LESS PO) Take by mouth.     Probiotic Product (PROBIOTIC PO) Take by mouth.     VITAMIN D PO Take by mouth.     gabapentin (NEURONTIN) 100 MG capsule Take 100 mg by mouth 3 (three) times daily. (Patient not taking: Reported on 03/19/2021)     No current facility-administered medications for this encounter.    Physical Findings: The patient is in no  acute distress. Patient is alert and oriented.  Accompanied by her husband on evaluation today.  height is 5' 0.75" (1.543 m) and weight is 110 lb 3.2 oz (50 kg). Her temperature is 97.6 F (36.4 C). Her blood pressure is 105/66 and her pulse is 87. Her respiration is 18 and oxygen saturation is 100%. .  No significant changes. Lungs are clear to auscultation bilaterally. Heart has regular rate and rhythm. No palpable cervical, supraclavicular, or axillary adenopathy. Abdomen soft, non-tender, normal bowel sounds.  On pelvic examination the external genitalia were unremarkable. A speculum exam was performed. There are no mucosal lesions noted in the vaginal vault.  Mild radiation changes  noted in the proximal vagina.  On bimanual and rectovaginal examination there are no pelvic masses appreciated.  Rectal sphincter tone good.   Lab Findings: Lab Results  Component Value Date   WBC 3.4 (L) 11/30/2018   HGB 12.3 11/30/2018   HCT 37.1 11/30/2018   MCV 97.6 11/30/2018   PLT 235 11/30/2018    Radiographic Findings: No results found.  Impression: Stage IB-3 adenocarcinoma of the cervix; s/p primary chemoradiation completed 09/21/18, and extrafascial hysterectomy on 10/29/2018 with minimal (focal) residual carcinoma.  No evidence of recurrence on clinical exam today.  The patient's symptoms related to her pelvic radiation therapy have improved over the past 6 months.  Plan: She is now over 2 years out from her treatment and can have spacing of her interval follow-up.  She will follow-up with Dr. Berline Lopes in 6 months.  She will have a Pap smear at that time.  Routine follow-up in radiation oncology in 1 year.  25 minutes of total time was spent for this patient encounter, including preparation, face-to-face counseling with the patient and coordination of care, physical exam, and documentation of the encounter. ____________________________________  Blair Promise, PhD, MD   This document serves as a record of services personally performed by Gery Pray, MD. It was created on his behalf by Roney Mans, a trained medical scribe. The creation of this record is based on the scribe's personal observations and the provider's statements to them. This document has been checked and approved by the attending provider.

## 2021-03-19 ENCOUNTER — Ambulatory Visit
Admission: RE | Admit: 2021-03-19 | Discharge: 2021-03-19 | Disposition: A | Discharge status: Home / Self Care | Payer: Managed Care, Other (non HMO) | Source: Ambulatory Visit | Attending: Radiation Oncology | Admitting: Radiation Oncology

## 2021-03-19 ENCOUNTER — Other Ambulatory Visit: Payer: Self-pay

## 2021-03-19 ENCOUNTER — Encounter: Payer: Self-pay | Admitting: Radiation Oncology

## 2021-03-19 VITALS — BP 105/66 | HR 87 | Temp 97.6°F | Resp 18 | Ht 60.75 in | Wt 110.2 lb

## 2021-03-19 DIAGNOSIS — C538 Malignant neoplasm of overlapping sites of cervix uteri: Secondary | ICD-10-CM

## 2021-03-19 DIAGNOSIS — Z923 Personal history of irradiation: Secondary | ICD-10-CM | POA: Insufficient documentation

## 2021-03-19 DIAGNOSIS — Z79899 Other long term (current) drug therapy: Secondary | ICD-10-CM | POA: Insufficient documentation

## 2021-03-19 DIAGNOSIS — Z8542 Personal history of malignant neoplasm of other parts of uterus: Secondary | ICD-10-CM | POA: Diagnosis not present

## 2021-03-19 DIAGNOSIS — Z9221 Personal history of antineoplastic chemotherapy: Secondary | ICD-10-CM | POA: Insufficient documentation

## 2021-03-19 DIAGNOSIS — R197 Diarrhea, unspecified: Secondary | ICD-10-CM | POA: Diagnosis not present

## 2021-06-13 ENCOUNTER — Telehealth: Payer: Self-pay | Admitting: *Deleted

## 2021-06-13 NOTE — Telephone Encounter (Signed)
Scheduled pt for a f/u appt with Dr.Tucker on 8/4 @ 2:30pm

## 2021-08-22 ENCOUNTER — Encounter: Payer: Self-pay | Admitting: Gynecologic Oncology

## 2021-08-24 ENCOUNTER — Inpatient Hospital Stay: Payer: Managed Care, Other (non HMO) | Attending: Gynecologic Oncology | Admitting: Gynecologic Oncology

## 2021-08-24 ENCOUNTER — Other Ambulatory Visit (HOSPITAL_COMMUNITY)
Admission: RE | Admit: 2021-08-24 | Discharge: 2021-08-24 | Disposition: A | Discharge status: Home / Self Care | Payer: Managed Care, Other (non HMO) | Source: Ambulatory Visit | Attending: Gynecologic Oncology | Admitting: Gynecologic Oncology

## 2021-08-24 ENCOUNTER — Other Ambulatory Visit: Payer: Self-pay

## 2021-08-24 ENCOUNTER — Encounter: Payer: Self-pay | Admitting: Gynecologic Oncology

## 2021-08-24 VITALS — BP 99/59 | HR 79 | Temp 98.3°F | Resp 17 | Wt 102.2 lb

## 2021-08-24 DIAGNOSIS — C538 Malignant neoplasm of overlapping sites of cervix uteri: Secondary | ICD-10-CM | POA: Diagnosis not present

## 2021-08-24 DIAGNOSIS — Z923 Personal history of irradiation: Secondary | ICD-10-CM

## 2021-08-24 DIAGNOSIS — Z87891 Personal history of nicotine dependence: Secondary | ICD-10-CM | POA: Diagnosis not present

## 2021-08-24 DIAGNOSIS — Z8541 Personal history of malignant neoplasm of cervix uteri: Secondary | ICD-10-CM | POA: Diagnosis not present

## 2021-08-24 DIAGNOSIS — Z9221 Personal history of antineoplastic chemotherapy: Secondary | ICD-10-CM

## 2021-08-24 DIAGNOSIS — Z90710 Acquired absence of both cervix and uterus: Secondary | ICD-10-CM

## 2021-08-24 NOTE — Patient Instructions (Addendum)
It was nice to meet you today.  I do not see or feel any evidence of cancer recurrence on your exam.  I will release your Pap test results to you once back in MyChart, it will likely be a week or so.  We will continue with visits every 6 months.  Your next visit will be with Dr. Sondra Come in February.  Please call sometime in June or July to get a visit scheduled with me for August.  As always, if you develop any new and concerning symptoms, please call to see me sooner.

## 2021-08-24 NOTE — Progress Notes (Signed)
Gynecologic Oncology Return Clinic Visit  08/24/2021  Reason for Visit: Follow-up in the setting of history of cervical cancer  Treatment History: Oncology History  Malignant neoplasm of overlapping sites of cervix ([place])  07/26/2018 Imaging   MR pelvis 1. Poorly marginated/infiltrative 4.7 x 3.4 x 3.0 cm mass of the left uterine cervix with invasion of the cervical stroma, worrisome for FIGO stage IB3 cervical carcinoma. No frank parametrial, bladder or vaginal involvement. GYN ONC consultation suggested. 2. No pelvic adenopathy. 3. No suspicious ovarian or adnexal findings.   07/28/2018 Initial Diagnosis   Malignant neoplasm of overlapping sites of cervix [facility])   07/28/2018 Pathology Results   1. Cervix, biopsy, ectocervix - ADENOCARCINOMA. - SEE MICROSCOPIC DESCRIPTION 2. Endocervix, curettage - ADENOCARCINOMA. - SEE MICROSCOPIC DESCRIPTION Microscopic Comment 1. -2. Both of the specimens show adenocarcinoma with similar features and the morphology favors endocervical primary. An endometrial primary cannot be completely excluded.   08/10/2018 Procedure   Status post right IJ port catheter placement. Catheter ready for use.   08/17/2018 - 09/07/2018 Chemotherapy   The patient had 4 cycles of cisplatin for chemotherapy treatment, complicated by severe nausea, vomiting and pancytopenia    10/20/2018 Imaging   Significant decrease in size of cervical mass since previous study. No evidence of parametrial invasion or other pelvic metastatic disease.   Tiny less than 1 cm intramural fibroids in the right posterior corpus and fundus.   11/30/2018 Procedure   Removal of implanted Port-A-Cath utilizing sharp and blunt dissection. The procedure was uncomplicated.      The patient reports the onset of heavy vaginal bleeding since Fathers' Day 2020. This resulted in syncopal feelings on 07/24/18 which led to her visiting the [facility] ER the same day. A TVUS was performed which showed a possible  endometrial mass.   An Hb was drawn which showed severe acute blood loss anemia of 7.'7mg'$ /dL.   She was admitted, received 2 units PRBC (with an appropriate corresponding rise in Hb to 9.7) and then received an MRI on 07/26/18. This showed the mildly enlarged anteverted uterus measures 10.7 x 5.4 x 5.5 cm. There is a 4.7 x 3.4 x 3.0 cm poorly marginated infiltrative mass centered in the anterior left uterine cervix which extends 12:00 to 4:00 in the lower cervix, and extends 12:00 to 8:00 in the upper cervix, demonstrating intermediate T2 signal intensity and enhancement. This mass demonstrates cervical stromal invasion of at least 6 mm with no frank parametrial invasion. Mass appears to invade the lower most portion of the uterine cavity. No evidence of vaginal or bladder invasion. Findings are worrisome for a FIGO stage IB3 cervical carcinoma.   She had never been vaccinated against HPV.    Biopsy of the cervix on 07/28/18 showed adenocarcinoma of the cervix.   PET showed no apparent involvement outside of the cervix and uterus.    She commenced primary chemoradiation on 08/04/18 (though chemotherapy was delayed due to lack of port access).    She completed radiation to the pelvis and cervix with Dr. Gery Pray with radiosensitizing chemotherapy completed on September 15, 2018.  She received a single HDR tandem and ring brachytherapy treatment with anticipation for completion hysterectomy given her excellent response to therapy and stage I bulky disease.  The goal of this treatment approach was to minimize long-term toxicity of brachytherapy by debulking the primary site.   MRI was performed following completion of therapy on October 20, 2018 and revealed a uterus measuring 7.1 x 6 4 x  4.7 cm with a small mass in the cervix so showing peripheral contrast-enhancement and a central area of necrosis that is significantly decreased in size.  It currently measures 2.1 x 1.7 cm.  The right and left ovaries  were normal.  There is no peritoneal thickening or free fluid or lymphadenopathy appreciated.   On October 29, 2018 she underwent robotic assisted total hysterectomy (extrafascial) with BSO.  Intraoperative findings revealed no gross residual cervical tumor and no suspicious lymph nodes.   Final pathology revealed focal residual adenocarcinoma, post neoadjuvant therapy.  Margins were negative.  Is a grade 2 moderately differentiated tumor.  The origin of the tumor was endocervical.   Post treatment imaging showed some thickening at the vaginal cuff.   Repeat MRI at [place] on 06/14/19 showed resolution of this asymmetric focus of enhancement within the left vaginal cuff.    Pap on 11/23/19 of the vaginal cuff showed atrophic vaginitis, no dysplasia or malignancy, negative high risk HPV.  Interval History: Last saw Dr. Sondra Come 02/2021, was doing well.  Presents with her husband today.  Reports overall doing well.  Denies any vaginal bleeding or discharge.  Has intermittent left upper quadrant pain since surgery, thinks this may be related to scar tissue.  Reports baseline bowel and bladder function.  Denies any pelvic pain.  Feels as though weight has stabilized.  Past Medical/Surgical History: Past Medical History:  Diagnosis Date   Anemia    Chemotherapy-induced nausea and vomiting    intermittant   Depression with anxiety    History of cancer chemotherapy    cervical cancer--- 08-17-2018 to 09-07-2018   History of external beam radiation therapy    cervical cancer--- 07-31-2018 to 09-08-2018   History of radiation therapy 09/15/2018   vaginal Tandem and Ring HDR 09/15/2018 Dr Gery Pray   Intermittent diarrhea    secondary to radiation therapy   Malignant neoplasm of overlapping sites of cervix [facility]) oncologist-- dr gorsuch/  dr Sondra Come   dx 07-28-2018--- Stage IB3--  completed chemotherapy 09-07-2018 and IMRT 09-08-2018   Migraine    Neuropathic pain    Pancytopenia, acquired  ([place])    Pelvic mass in female 07/28/2018    Past Surgical History:  Procedure Laterality Date   IR IMAGING GUIDED PORT INSERTION  08/11/2018   IR REMOVAL TUN ACCESS W/ PORT W/O FL MOD SED  11/30/2018   OPERATIVE ULTRASOUND N/A 09/15/2018   Procedure: OPERATIVE ULTRASOUND;  Surgeon: Gery Pray, MD;  Location: [facility];  Service: Urology;  Laterality: N/A;   ROBOTIC ASSISTED TOTAL HYSTERECTOMY WITH BILATERAL SALPINGO OOPHERECTOMY Bilateral 10/29/2018   Procedure: XI ROBOTIC ASSISTED TOTAL HYSTERECTOMY WITH BILATERAL SALPINGO OOPHORECTOMY;  Surgeon: Everitt Amber, MD;  Location: [facility];  Service: Gynecology;  Laterality: Bilateral;   TANDEM RING INSERTION N/A 09/15/2018   Procedure: TANDEM RING INSERTION;  Surgeon: Gery Pray, MD;  Location[facility];  Service: Urology;  Laterality: N/A;   WISDOM TOOTH EXTRACTION      Family History  Problem Relation Age of Onset   Multiple sclerosis Mother    Heart disease Father    Multiple sclerosis Sister    Heart disease Paternal Uncle    Heart disease Paternal Grandfather    Colon cancer Neg Hx    Breast cancer Neg Hx    Ovarian cancer Neg Hx    Endometrial cancer Neg Hx    Pancreatic cancer Neg Hx    Prostate cancer Neg Hx     Social  History   Socioeconomic History   Marital status: Married    Spouse name: Leane Para   Number of children: 3   Years of education: 12   Highest education level: Not on file  Occupational History   Not on file  Tobacco Use   Smoking status: Former    Years: 8.00    Types: Cigarettes    Quit date: 02/15/2018    Years since quitting: 3.5   Smokeless tobacco: Never  Vaping Use   Vaping Use: Never used  Substance and Sexual Activity   Alcohol use: Not Currently   Drug use: Never   Sexual activity: Yes    Birth control/protection: Other-see comments    Comment: husband ---- vasectomy ;  abstinence since 06/ 2020  Other Topics Concern   Not on file  Social History  Narrative   Lives with husband, children   Caffeine ,limited   Social Determinants of Health   Financial Resource Strain: Not on file  Food Insecurity: Not on file  Transportation Needs: Not on file  Physical Activity: Not on file  Stress: Not on file  Social Connections: Not on file    Current Medications:  Current Outpatient Medications:    Ascorbic Acid (VITAMIN C) 100 MG tablet, Take 100 mg by mouth daily., Disp: , Rfl:    BIOTIN PO, Take by mouth., Disp: , Rfl:    BLACK COHOSH PO, Take by mouth., Disp: , Rfl:    conjugated estrogens (PREMARIN) vaginal cream, Place 1 Applicatorful vaginally 3 (three) times a week., Disp: 42.5 g, Rfl: 12   Cyanocobalamin (B-12 PO), Take 1 Dose by mouth daily. Sublingual, Disp: , Rfl:    DULoxetine (CYMBALTA) 30 MG capsule, Take 2 capsules in the morning and 1 capsule at bedtime. Please call and make overdue appt for further refills, Disp: 270 capsule, Rfl: 0   ELDERBERRY PO, Take 2 each by mouth daily., Disp: , Rfl:    gabapentin (NEURONTIN) 100 MG capsule, Take 100 mg by mouth 3 (three) times daily., Disp: , Rfl:    Misc Natural Products (T-RELIEF CBD+13) SUBL, Place under the tongue., Disp: , Rfl:    Multiple Vitamin (MULTIVITAMIN WITH MINERALS) TABS tablet, Take 1 tablet by mouth daily., Disp: , Rfl:    Omega-3 Fatty Acids (FISH OIL BURP-LESS PO), Take by mouth., Disp: , Rfl:    Probiotic Product (PROBIOTIC PO), Take by mouth., Disp: , Rfl:    VITAMIN D PO, Take by mouth., Disp: , Rfl:   Review of Systems: Denies appetite changes, fevers, chills, fatigue, unexplained weight changes. Denies hearing loss, neck lumps or masses, mouth sores, ringing in ears or voice changes. Denies cough or wheezing.  Denies shortness of breath. Denies chest pain or palpitations. Denies leg swelling. Denies abdominal distention, pain, blood in stools, constipation, diarrhea, nausea, vomiting, or early satiety. Denies pain with intercourse, dysuria, frequency,  hematuria or incontinence. Denies hot flashes, pelvic pain, vaginal bleeding or vaginal discharge.   Denies joint pain, back pain or muscle pain/cramps. Denies itching, rash, or wounds. Denies dizziness, headaches, numbness or seizures. Denies swollen lymph nodes or glands, denies easy bruising or bleeding. Denies anxiety, depression, confusion, or decreased concentration.  Physical Exam: BP (!) 99/59 (BP Location: Left Arm, Patient Position: Sitting)   Pulse 79   Temp 98.3 F (36.8 C) (Tympanic)   Resp 17   Wt 102 lb 3 oz (46.4 kg)   SpO2 100%   BMI 19.47 kg/m  General: Alert, oriented, no acute distress.  HEENT: Normocephalic, atraumatic, sclera anicteric. Chest: Clear to auscultation bilaterally.  No wheezes or rhonchi. Cardiovascular: Regular rate and rhythm, no murmurs. Abdomen: soft, nontender.  Normoactive bowel sounds.  No masses or hepatosplenomegaly appreciated.  Well-healed incisions, some scar tissue palpated under left upper quadrant incision. Extremities: Grossly normal range of motion.  Warm, well perfused.  No edema bilaterally. Skin: No rashes or lesions noted. Lymphatics: No cervical, supraclavicular, or inguinal adenopathy. GU: Normal appearing external genitalia without erythema, excoriation, or lesions.  Speculum exam reveals vaginal cuff is smooth, no lesions or atypical vascularity noted.  Bimanual exam reveals no masses or nodularity.  Rectovaginal exam confirms these findings.  Laboratory & Radiologic Studies: None new  Assessment & Plan: Jordan Waters is a 46 y.o. woman with history of stage IB3 endocervical adenocarcinoma s/p primary chemoradiation completed 09/21/18, s/p completion extrafascial hysterectomy on 10/29/2018 with minimal (focal) residual carcinoma.   No evidence of recurrence on today's exam.  Overall doing well.  Has lost approximately 10 pounds since September but notes that she is almost back to her baseline weight prior to surgery.  Feels  that her weight has stabilized.  Discussed that if she continues to lose weight to please call me.  Cotesting performed today.  I will release these results to her MyChart once back.   She will follow-up with Dr Sondra Come in 6 months and return to see me in 12 months.  We discussed signs and symptoms that should prompt a phone call before her next visit.  20 minutes of total time was spent for this patient encounter, including preparation, face-to-face counseling with the patient and coordination of care, and documentation of the encounter.  Jeral Pinch, MD  Division of Gynecologic Oncology  Department of Obstetrics and Gynecology  [facility]

## 2021-08-28 LAB — CYTOLOGY - PAP
Comment: NEGATIVE
Diagnosis: NEGATIVE
High risk HPV: POSITIVE — AB

## 2021-08-29 ENCOUNTER — Telehealth: Payer: Self-pay

## 2021-08-29 NOTE — Telephone Encounter (Signed)
Per Dr.Tucker pt needs a Colposcopy in 4-6 weeks.  Pt aware and is scheduled for 10/3 @ 10:45.

## 2021-09-18 ENCOUNTER — Encounter: Payer: Self-pay | Admitting: Gynecologic Oncology

## 2021-09-20 ENCOUNTER — Encounter: Payer: Self-pay | Admitting: Gynecologic Oncology

## 2021-09-20 ENCOUNTER — Other Ambulatory Visit: Payer: Self-pay

## 2021-09-20 ENCOUNTER — Inpatient Hospital Stay (HOSPITAL_BASED_OUTPATIENT_CLINIC_OR_DEPARTMENT_OTHER): Payer: Managed Care, Other (non HMO) | Admitting: Gynecologic Oncology

## 2021-09-20 VITALS — BP 108/60 | HR 70 | Temp 98.2°F | Resp 16 | Ht 60.0 in | Wt 100.3 lb

## 2021-09-20 DIAGNOSIS — Z87891 Personal history of nicotine dependence: Secondary | ICD-10-CM | POA: Insufficient documentation

## 2021-09-20 DIAGNOSIS — N890 Mild vaginal dysplasia: Secondary | ICD-10-CM | POA: Diagnosis not present

## 2021-09-20 DIAGNOSIS — Z90722 Acquired absence of ovaries, bilateral: Secondary | ICD-10-CM | POA: Insufficient documentation

## 2021-09-20 DIAGNOSIS — Z923 Personal history of irradiation: Secondary | ICD-10-CM | POA: Insufficient documentation

## 2021-09-20 DIAGNOSIS — Z9221 Personal history of antineoplastic chemotherapy: Secondary | ICD-10-CM | POA: Diagnosis not present

## 2021-09-20 DIAGNOSIS — B977 Papillomavirus as the cause of diseases classified elsewhere: Secondary | ICD-10-CM

## 2021-09-20 DIAGNOSIS — Z90710 Acquired absence of both cervix and uterus: Secondary | ICD-10-CM | POA: Diagnosis not present

## 2021-09-20 DIAGNOSIS — C538 Malignant neoplasm of overlapping sites of cervix uteri: Secondary | ICD-10-CM

## 2021-09-20 DIAGNOSIS — Z8541 Personal history of malignant neoplasm of cervix uteri: Secondary | ICD-10-CM | POA: Insufficient documentation

## 2021-09-20 NOTE — Progress Notes (Signed)
Gynecologic Oncology Return Clinic Visit  09/20/2021  Reason for Visit: vaginoscopy  Treatment History: Oncology History  Malignant neoplasm of overlapping sites of cervix ([place])  07/26/2018 Imaging   MR pelvis 1. Poorly marginated/infiltrative 4.7 x 3.4 x 3.0 cm mass of the left uterine cervix with invasion of the cervical stroma, worrisome for FIGO stage IB3 cervical carcinoma. No frank parametrial, bladder or vaginal involvement. GYN ONC consultation suggested. 2. No pelvic adenopathy. 3. No suspicious ovarian or adnexal findings.   07/28/2018 Initial Diagnosis   Malignant neoplasm of overlapping sites of cervix [facility])   07/28/2018 Pathology Results   1. Cervix, biopsy, ectocervix - ADENOCARCINOMA. - SEE MICROSCOPIC DESCRIPTION 2. Endocervix, curettage - ADENOCARCINOMA. - SEE MICROSCOPIC DESCRIPTION Microscopic Comment 1. -2. Both of the specimens show adenocarcinoma with similar features and the morphology favors endocervical primary. An endometrial primary cannot be completely excluded.   08/10/2018 Procedure   Status post right IJ port catheter placement. Catheter ready for use.   08/17/2018 - 09/07/2018 Chemotherapy   The patient had 4 cycles of cisplatin for chemotherapy treatment, complicated by severe nausea, vomiting and pancytopenia    10/20/2018 Imaging   Significant decrease in size of cervical mass since previous study. No evidence of parametrial invasion or other pelvic metastatic disease.   Tiny less than 1 cm intramural fibroids in the right posterior corpus and fundus.   11/30/2018 Procedure   Removal of implanted Port-A-Cath utilizing sharp and blunt dissection. The procedure was uncomplicated.      The patient reports the onset of heavy vaginal bleeding since Fathers' Day 2020. This resulted in syncopal feelings on 07/24/18 which led to her visiting the [facility] ER the same day. A TVUS was performed which showed a possible endometrial mass.   An Hb was drawn which  showed severe acute blood loss anemia of 7.'7mg'$ /dL.   She was admitted, received 2 units PRBC (with an appropriate corresponding rise in Hb to 9.7) and then received an MRI on 07/26/18. This showed the mildly enlarged anteverted uterus measures 10.7 x 5.4 x 5.5 cm. There is a 4.7 x 3.4 x 3.0 cm poorly marginated infiltrative mass centered in the anterior left uterine cervix which extends 12:00 to 4:00 in the lower cervix, and extends 12:00 to 8:00 in the upper cervix, demonstrating intermediate T2 signal intensity and enhancement. This mass demonstrates cervical stromal invasion of at least 6 mm with no frank parametrial invasion. Mass appears to invade the lower most portion of the uterine cavity. No evidence of vaginal or bladder invasion. Findings are worrisome for a FIGO stage IB3 cervical carcinoma.   She had never been vaccinated against HPV.    Biopsy of the cervix on 07/28/18 showed adenocarcinoma of the cervix.   PET showed no apparent involvement outside of the cervix and uterus.    She commenced primary chemoradiation on 08/04/18 (though chemotherapy was delayed due to lack of port access).    She completed radiation to the pelvis and cervix with Dr. Gery Pray with radiosensitizing chemotherapy completed on September 15, 2018.  She received a single HDR tandem and ring brachytherapy treatment with anticipation for completion hysterectomy given her excellent response to therapy and stage I bulky disease.  The goal of this treatment approach was to minimize long-term toxicity of brachytherapy by debulking the primary site.   MRI was performed following completion of therapy on October 20, 2018 and revealed a uterus measuring 7.1 x 6 4 x 4.7 cm with a small mass in the  cervix so showing peripheral contrast-enhancement and a central area of necrosis that is significantly decreased in size.  It currently measures 2.1 x 1.7 cm.  The right and left ovaries were normal.  There is no peritoneal  thickening or free fluid or lymphadenopathy appreciated.   On October 29, 2018 she underwent robotic assisted total hysterectomy (extrafascial) with BSO.  Intraoperative findings revealed no gross residual cervical tumor and no suspicious lymph nodes.   Final pathology revealed focal residual adenocarcinoma, post neoadjuvant therapy.  Margins were negative.  Is a grade 2 moderately differentiated tumor.  The origin of the tumor was endocervical.   Post treatment imaging showed some thickening at the vaginal cuff.   Repeat MRI at [facility] on 06/14/19 showed resolution of this asymmetric focus of enhancement within the left vaginal cuff.    Pap on 11/23/19 of the vaginal cuff showed atrophic vaginitis, no dysplasia or malignancy, negative high risk HPV.  Pap 08/24/21: NILM, HR HPV +  Interval History: Doing well. No changes since recent visit.  Past Medical/Surgical History: Past Medical History:  Diagnosis Date   Anemia    Chemotherapy-induced nausea and vomiting    intermittant   Depression with anxiety    History of cancer chemotherapy    cervical cancer--- 08-17-2018 to 09-07-2018   History of external beam radiation therapy    cervical cancer--- 07-31-2018 to 09-08-2018   History of radiation therapy 09/15/2018   vaginal Tandem and Ring HDR 09/15/2018 Dr Gery Pray   Intermittent diarrhea    secondary to radiation therapy   Malignant neoplasm of overlapping sites of cervix [facility]) oncologist-- dr gorsuch/  dr Sondra Come   dx 07-28-2018--- Stage IB3--  completed chemotherapy 09-07-2018 and IMRT 09-08-2018   Migraine    Neuropathic pain    Pancytopenia, acquired ([place])    Pelvic mass in female 07/28/2018    Past Surgical History:  Procedure Laterality Date   IR IMAGING GUIDED PORT INSERTION  08/11/2018   IR REMOVAL TUN ACCESS W/ PORT W/O FL MOD SED  11/30/2018   OPERATIVE ULTRASOUND N/A 09/15/2018   Procedure: OPERATIVE ULTRASOUND;  Surgeon: Gery Pray, MD;  Location: [facility];  Service: Urology;  Laterality: N/A;   ROBOTIC ASSISTED TOTAL HYSTERECTOMY WITH BILATERAL SALPINGO OOPHERECTOMY Bilateral 10/29/2018   Procedure: XI ROBOTIC ASSISTED TOTAL HYSTERECTOMY WITH BILATERAL SALPINGO OOPHORECTOMY;  Surgeon: Everitt Amber, MD;  Location: [facility];  Service: Gynecology;  Laterality: Bilateral;   TANDEM RING INSERTION N/A 09/15/2018   Procedure: TANDEM RING INSERTION;  Surgeon: Gery Pray, MD;  Location: [facility];  Service: Urology;  Laterality: N/A;   WISDOM TOOTH EXTRACTION      Family History  Problem Relation Age of Onset   Multiple sclerosis Mother    Heart disease Father    Multiple sclerosis Sister    Heart disease Paternal Uncle    Heart disease Paternal Grandfather    Colon cancer Neg Hx    Breast cancer Neg Hx    Ovarian cancer Neg Hx    Endometrial cancer Neg Hx    Pancreatic cancer Neg Hx    Prostate cancer Neg Hx     Social History   Socioeconomic History   Marital status: Married    Spouse name: Leane Para   Number of children: 3   Years of education: 12   Highest education level: Not on file  Occupational History   Not on file  Tobacco Use   Smoking status: Former    Years:  8.00    Types: Cigarettes    Quit date: 02/15/2018    Years since quitting: 3.5   Smokeless tobacco: Never  Vaping Use   Vaping Use: Never used  Substance and Sexual Activity   Alcohol use: Not Currently   Drug use: Never   Sexual activity: Yes    Birth control/protection: Other-see comments    Comment: husband ---- vasectomy ;  abstinence since 06/ 2020  Other Topics Concern   Not on file  Social History Narrative   Lives with husband, children   Caffeine ,limited   Social Determinants of Health   Financial Resource Strain: Not on file  Food Insecurity: Not on file  Transportation Needs: Not on file  Physical Activity: Not on file  Stress: Not on file  Social Connections: Not on file    Current Medications:  Current  Outpatient Medications:    Ascorbic Acid (VITAMIN C) 100 MG tablet, Take 100 mg by mouth daily., Disp: , Rfl:    BIOTIN PO, Take by mouth., Disp: , Rfl:    BLACK COHOSH PO, Take by mouth., Disp: , Rfl:    Cyanocobalamin (B-12 PO), Take 1 Dose by mouth daily. Sublingual, Disp: , Rfl:    DULoxetine (CYMBALTA) 30 MG capsule, Take 2 capsules in the morning and 1 capsule at bedtime. Please call and make overdue appt for further refills, Disp: 270 capsule, Rfl: 0   ELDERBERRY PO, Take 2 each by mouth daily., Disp: , Rfl:    gabapentin (NEURONTIN) 100 MG capsule, Take 100 mg by mouth 3 (three) times daily., Disp: , Rfl:    Misc Natural Products (T-RELIEF CBD+13) SUBL, Place under the tongue., Disp: , Rfl:    Multiple Vitamin (MULTIVITAMIN WITH MINERALS) TABS tablet, Take 1 tablet by mouth daily., Disp: , Rfl:    Omega-3 Fatty Acids (FISH OIL BURP-LESS PO), Take by mouth., Disp: , Rfl:    Probiotic Product (PROBIOTIC PO), Take by mouth., Disp: , Rfl:    VITAMIN D PO, Take by mouth., Disp: , Rfl:    conjugated estrogens (PREMARIN) vaginal cream, Place 1 Applicatorful vaginally 3 (three) times a week. (Patient not taking: Reported on 09/18/2021), Disp: 42.5 g, Rfl: 12  Review of Systems: Denies appetite changes, fevers, chills, fatigue, unexplained weight changes. Denies hearing loss, neck lumps or masses, mouth sores, ringing in ears or voice changes. Denies cough or wheezing.  Denies shortness of breath. Denies chest pain or palpitations. Denies leg swelling. Denies abdominal distention, pain, blood in stools, constipation, diarrhea, nausea, vomiting, or early satiety. Denies pain with intercourse, dysuria, frequency, hematuria or incontinence. Denies hot flashes, pelvic pain, vaginal bleeding or vaginal discharge.   Denies joint pain, back pain or muscle pain/cramps. Denies itching, rash, or wounds. Denies dizziness, headaches, numbness or seizures. Denies swollen lymph nodes or glands, denies easy  bruising or bleeding. Denies anxiety, depression, confusion, or decreased concentration.  Physical Exam: BP 108/60 (BP Location: Left Arm, Patient Position: Sitting)   Pulse 70   Temp 98.2 F (36.8 C) (Oral)   Resp 16   Ht 5' (1.524 m)   Wt 100 lb 4.8 oz (45.5 kg)   SpO2 100%   BMI 19.59 kg/m  General: Alert, oriented, no acute distress. HEENT: Normocephalic, atraumatic, sclera anicteric. Chest: Unlabored breathing on room air.  GU: Normal appearing external genitalia without erythema, excoriation, or lesions.  Speculum exam reveals atrophic vaginal mucosa, no lesions noted.    Vaginoscopy Preoperative diagnosis: Recent normal Pap, high risk HPV positive  Postoperative diagnosis: Same as above Physician: Berline Lopes MD Estimated blood loss: Minimal Specimens: Vaginal cuff biopsy Procedure: After the procedure was discussed with the patient including risks and benefits, she gave verbal consent.  She was then placed in dorsolithotomy position and a speculum was placed in the vagina.  Vaginal cuff was visualized.  5% acetic acid was used and the vaginal cuff was examined with the colposcope.  Atrophic and radiation related changes noted.  Very small area in the midline portion of the cuff with several small prominent blood vessels.  Biopsy taken here with Tischler forceps although quite difficult to remove substantial tissue.  Approximately 3 very small biopsies were taken and placed in formalin.  No bleeding noted. Overall the patient tolerated the procedure well.  All instruments were removed from the vagina.  Laboratory & Radiologic Studies: Pap: NIML, HR HPV+  Assessment & Plan: Jordan Waters is a 46 y.o. woman with history of stage IB3 endocervical adenocarcinoma s/p primary chemoradiation completed 09/21/18, s/p completion extrafascial hysterectomy on 10/29/2018 with minimal (focal) residual carcinoma. Recent pap NIML, HR HPV +  Presents for Vaginoscopy today.  Overall findings  consistent with radiation changes, no findings concerning for high-grade dysplasia.  Given small area with prominent blood vessels, biopsy taken.  We will contact patient with pathology results.  As long as normal or low-grade dysplasia, we will plan on continued surveillance.  14 minutes of total time was spent for this patient encounter, including preparation, face-to-face counseling with the patient and coordination of care, and documentation of the encounter.  Jeral Pinch, MD  Division of Gynecologic Oncology  Department of Obstetrics and Gynecology  [facility]

## 2021-09-20 NOTE — Patient Instructions (Addendum)
It was good to see you today. I will let you know as soon as I have biopsy results back.

## 2021-09-21 LAB — SURGICAL PATHOLOGY

## 2021-10-23 ENCOUNTER — Ambulatory Visit: Payer: Managed Care, Other (non HMO) | Admitting: Gynecologic Oncology

## 2022-03-20 NOTE — Progress Notes (Signed)
Radiation Oncology         [phone] ________________________________  Name: Jordan Waters MRN: [id number]  Date: 03/21/2022  DOB: May 06, 1975  Follow-Up Visit Note  CC: Pcp, No  Danford, Katy D, NP  No diagnosis found.  Diagnosis: Stage IB-3 adenocarcinoma of the cervix; s/p primary chemoradiation completed 09/21/18, and extrafascial hysterectomy on 10/29/2018 with minimal (focal) residual carcinoma   Interval Since Last Radiation: 3 years, 6 months, 11 days    Radiation Treatment Dates: 07/31/2018 through 09/08/2018 Site Technique Total Dose Dose per Fx Completed Fx Beam Energies  Pelvis: Cervix_pelvis 3D 45/45 1.8 25/25 15X    09/15/2018: Brachytherapy/HDR, 45 tandem/ring system, 60 mm tandem / 5.5 Gy in 1 fraction  Narrative:  The patient returns today for routine follow-up. She was last seen for follow-up on 03/19/21. Since her last visit, the patient followed up with Dr. Berline Lopes on 08/24/21. During which time, the patient endorsed intermittent left upper quadrant pain since her surgery, which she thought might be related to scar tissue. She otherwise denied any symptoms concerning for disease recurrence, and she as noted as NED on examination.     No other significant interval history since the patient was last seen for follow-up.   ***                             Allergies:  has No Known Allergies.  Meds: Current Outpatient Medications  Medication Sig Dispense Refill   Ascorbic Acid (VITAMIN C) 100 MG tablet Take 100 mg by mouth daily.     BIOTIN PO Take by mouth.     BLACK COHOSH PO Take by mouth.     conjugated estrogens (PREMARIN) vaginal cream Place 1 Applicatorful vaginally 3 (three) times a week. (Patient not taking: Reported on 09/18/2021) 42.5 g 12   Cyanocobalamin (B-12 PO) Take 1 Dose by mouth daily. Sublingual     DULoxetine (CYMBALTA) 30 MG capsule Take 2 capsules in the morning and 1 capsule at bedtime. Please call and make overdue appt for further refills  270 capsule 0   ELDERBERRY PO Take 2 each by mouth daily.     gabapentin (NEURONTIN) 100 MG capsule Take 100 mg by mouth 3 (three) times daily.     Misc Natural Products (T-RELIEF CBD+13) SUBL Place under the tongue.     Multiple Vitamin (MULTIVITAMIN WITH MINERALS) TABS tablet Take 1 tablet by mouth daily.     Omega-3 Fatty Acids (FISH OIL BURP-LESS PO) Take by mouth.     Probiotic Product (PROBIOTIC PO) Take by mouth.     VITAMIN D PO Take by mouth.     No current facility-administered medications for this encounter.    Physical Findings: The patient is in no acute distress. Patient is alert and oriented.  vitals were not taken for this visit. .  No significant changes. Lungs are clear to auscultation bilaterally. Heart has regular rate and rhythm. No palpable cervical, supraclavicular, or axillary adenopathy. Abdomen soft, non-tender, normal bowel sounds.  On pelvic examination the external genitalia were unremarkable. A speculum exam was performed. There are no mucosal lesions noted in the vaginal vault. A Pap smear was obtained of the proximal vagina. On bimanual and rectovaginal examination there were no pelvic masses appreciated. ***   Lab Findings: Lab Results  Component Value Date   WBC 3.4 (L) 11/30/2018   HGB 12.3 11/30/2018   HCT 37.1 11/30/2018   MCV 97.6 11/30/2018  PLT 235 11/30/2018    Radiographic Findings: No results found.  Impression: Stage IB-3 adenocarcinoma of the cervix; s/p primary chemoradiation completed 09/21/18, and extrafascial hysterectomy on 10/29/2018 with minimal (focal) residual carcinoma.   The patient is recovering from the effects of radiation.  ***  Plan:  ***   *** minutes of total time was spent for this patient encounter, including preparation, face-to-face counseling with the patient and coordination of care, physical exam, and documentation of the encounter. ____________________________________  Blair Promise, PhD, MD  This  document serves as a record of services personally performed by Gery Pray, MD. It was created on his behalf by Roney Mans, a trained medical scribe. The creation of this record is based on the scribe's personal observations and the provider's statements to them. This document has been checked and approved by the attending provider.

## 2022-03-21 ENCOUNTER — Ambulatory Visit
Admission: RE | Admit: 2022-03-21 | Discharge: 2022-03-21 | Disposition: A | Discharge status: Home / Self Care | Payer: 59 | Source: Ambulatory Visit | Attending: Radiation Oncology | Admitting: Radiation Oncology

## 2022-03-21 ENCOUNTER — Encounter: Payer: Self-pay | Admitting: Radiation Oncology

## 2022-03-21 VITALS — BP 101/50 | HR 84 | Temp 97.8°F | Resp 18 | Ht 61.0 in | Wt 116.0 lb

## 2022-03-21 DIAGNOSIS — C538 Malignant neoplasm of overlapping sites of cervix uteri: Secondary | ICD-10-CM

## 2022-03-21 NOTE — Progress Notes (Signed)
Jordan Waters is here today for follow up post radiation to the pelvic.  They completed their radiation on: 09/08/18  Does the patient complain of any of the following:  Pain:Reports "Pulling pain to left abdomen since surgery." Surgery was in 2020 Abdominal bloating: yes Diarrhea/Constipation: Both Nausea/Vomiting: No Vaginal Discharge: No Blood in Urine or Stool: No Urinary Issues (dysuria/incomplete emptying/ incontinence/ increased frequency/urgency): No Does patient report using vaginal dilator 2-3 times a week and/or sexually active 2-3 weeks: Yes, sexually active. Post radiation skin changes: No   Additional comments if applicable:   BP (!) 0000000 (BP Location: Left Arm, Patient Position: Sitting, Cuff Size: Normal)   Pulse 84   Temp 97.8 F (36.6 C)   Resp 18   Ht '5\' 1"'$  (1.549 m)   Wt 116 lb (52.6 kg)   SpO2 100%   BMI 21.92 kg/m

## 2022-07-02 ENCOUNTER — Telehealth: Payer: Self-pay | Admitting: *Deleted

## 2022-07-02 NOTE — Telephone Encounter (Signed)
Talbert Forest from radiation called and scheduled the patient for a follow up appt with  Dr Pricilla Holm on 8/30 at 2:45 pm. Appt scheduled and Talbert Forest will contact the patient with the appt date/time.

## 2022-07-02 NOTE — Telephone Encounter (Signed)
Called patient to inform of fu appt. with Dr. Pricilla Holm on 09/20/22- arrival time- 2:30 pm, spoke with patient and she is aware of this appt.

## 2022-09-20 ENCOUNTER — Inpatient Hospital Stay: Payer: 59 | Attending: Gynecologic Oncology | Admitting: Gynecologic Oncology

## 2022-09-20 ENCOUNTER — Encounter: Payer: Self-pay | Admitting: Gynecologic Oncology

## 2022-09-20 ENCOUNTER — Other Ambulatory Visit (HOSPITAL_COMMUNITY)
Admission: RE | Admit: 2022-09-20 | Discharge: 2022-09-20 | Disposition: A | Discharge status: Home / Self Care | Payer: 59 | Source: Ambulatory Visit | Attending: Gynecologic Oncology | Admitting: Gynecologic Oncology

## 2022-09-20 VITALS — BP 111/54 | HR 73 | Temp 98.6°F | Resp 20 | Wt 112.6 lb

## 2022-09-20 DIAGNOSIS — C538 Malignant neoplasm of overlapping sites of cervix uteri: Secondary | ICD-10-CM | POA: Insufficient documentation

## 2022-09-20 DIAGNOSIS — Z08 Encounter for follow-up examination after completed treatment for malignant neoplasm: Secondary | ICD-10-CM | POA: Insufficient documentation

## 2022-09-20 DIAGNOSIS — Z923 Personal history of irradiation: Secondary | ICD-10-CM | POA: Diagnosis not present

## 2022-09-20 DIAGNOSIS — Z90710 Acquired absence of both cervix and uterus: Secondary | ICD-10-CM | POA: Diagnosis not present

## 2022-09-20 DIAGNOSIS — Z9221 Personal history of antineoplastic chemotherapy: Secondary | ICD-10-CM | POA: Diagnosis not present

## 2022-09-20 DIAGNOSIS — Z90722 Acquired absence of ovaries, bilateral: Secondary | ICD-10-CM | POA: Diagnosis not present

## 2022-09-20 DIAGNOSIS — Z8541 Personal history of malignant neoplasm of cervix uteri: Secondary | ICD-10-CM | POA: Diagnosis present

## 2022-09-20 NOTE — Progress Notes (Signed)
Gynecologic Oncology Return Clinic Visit  09/20/22  Reason for Visit: surveillance  Treatment History: Oncology History  Malignant neoplasm of overlapping sites of cervix (HCC)  07/26/2018 Imaging   MR pelvis 1. Poorly marginated/infiltrative 4.7 x 3.4 x 3.0 cm mass of the left uterine cervix with invasion of the cervical stroma, worrisome for FIGO stage IB3 cervical carcinoma. No frank parametrial, bladder or vaginal involvement. GYN ONC consultation suggested. 2. No pelvic adenopathy. 3. No suspicious ovarian or adnexal findings.   07/28/2018 Initial Diagnosis   Malignant neoplasm of overlapping sites of cervix [facility])   07/28/2018 Pathology Results   1. Cervix, biopsy, ectocervix - ADENOCARCINOMA. - SEE MICROSCOPIC DESCRIPTION 2. Endocervix, curettage - ADENOCARCINOMA. - SEE MICROSCOPIC DESCRIPTION Microscopic Comment 1. -2. Both of the specimens show adenocarcinoma with similar features and the morphology favors endocervical primary. An endometrial primary cannot be completely excluded.   08/10/2018 Procedure   Status post right IJ port catheter placement. Catheter ready for use.   08/17/2018 - 09/07/2018 Chemotherapy   The patient had 4 cycles of cisplatin for chemotherapy treatment, complicated by severe nausea, vomiting and pancytopenia    10/20/2018 Imaging   Significant decrease in size of cervical mass since previous study. No evidence of parametrial invasion or other pelvic metastatic disease.   Tiny less than 1 cm intramural fibroids in the right posterior corpus and fundus.   11/30/2018 Procedure   Removal of implanted Port-A-Cath utilizing sharp and blunt dissection. The procedure was uncomplicated.      The patient reports the onset of heavy vaginal bleeding since Fathers' Day 2020. This resulted in syncopal feelings on 07/24/18 which led to her visiting the [facility] ER the same day. A TVUS was performed which showed a possible endometrial mass.   An Hb was drawn which  showed severe acute blood loss anemia of 7.7mg /dL.   She was admitted, received 2 units PRBC (with an appropriate corresponding rise in Hb to 9.7) and then received an MRI on 07/26/18. This showed the mildly enlarged anteverted uterus measures 10.7 x 5.4 x 5.5 cm. There is a 4.7 x 3.4 x 3.0 cm poorly marginated infiltrative mass centered in the anterior left uterine cervix which extends 12:00 to 4:00 in the lower cervix, and extends 12:00 to 8:00 in the upper cervix, demonstrating intermediate T2 signal intensity and enhancement. This mass demonstrates cervical stromal invasion of at least 6 mm with no frank parametrial invasion. Mass appears to invade the lower most portion of the uterine cavity. No evidence of vaginal or bladder invasion. Findings are worrisome for a FIGO stage IB3 cervical carcinoma.   She had never been vaccinated against HPV.    Biopsy of the cervix on 07/28/18 showed adenocarcinoma of the cervix.   PET showed no apparent involvement outside of the cervix and uterus.    She commenced primary chemoradiation on 08/04/18 (though chemotherapy was delayed due to lack of port access).    She completed radiation to the pelvis and cervix with Dr. Antony Blackbird with radiosensitizing chemotherapy completed on September 15, 2018.  She received a single HDR tandem and ring brachytherapy treatment with anticipation for completion hysterectomy given her excellent response to therapy and stage I bulky disease.  The goal of this treatment approach was to minimize long-term toxicity of brachytherapy by debulking the primary site.   MRI was performed following completion of therapy on October 20, 2018 and revealed a uterus measuring 7.1 x 6 4 x 4.7 cm with a small mass in the  cervix so showing peripheral contrast-enhancement and a central area of necrosis that is significantly decreased in size.  It currently measures 2.1 x 1.7 cm.  The right and left ovaries were normal.  There is no peritoneal  thickening or free fluid or lymphadenopathy appreciated.   On October 29, 2018 she underwent robotic assisted total hysterectomy (extrafascial) with BSO.  Intraoperative findings revealed no gross residual cervical tumor and no suspicious lymph nodes.   Final pathology revealed focal residual adenocarcinoma, post neoadjuvant therapy.  Margins were negative.  Is a grade 2 moderately differentiated tumor.  The origin of the tumor was endocervical.   Post treatment imaging showed some thickening at the vaginal cuff.   Repeat MRI at [place] on 06/14/19 showed resolution of this asymmetric focus of enhancement within the left vaginal cuff.    Pap on 11/23/19 of the vaginal cuff showed atrophic vaginitis, no dysplasia or malignancy, negative high risk HPV.  Interval History: Doing well.  She and her husband recently sold their house to downsize.  Empty [employer] as of this summer.  Reports doing well.  Continues to have some left upper quadrant soreness, unchanged since surgery.  Reports normal bowel bladder function.  Denies any vaginal bleeding or discharge.  Is feeling much better.  Stopped all of her prescription medications is taking only multivitamin and a probiotic.  Past Medical/Surgical History: Past Medical History:  Diagnosis Date   Anemia    Chemotherapy-induced nausea and vomiting    intermittant   Depression with anxiety    History of cancer chemotherapy    cervical cancer--- 08-17-2018 to 09-07-2018   History of external beam radiation therapy    cervical cancer--- 07-31-2018 to 09-08-2018   History of radiation therapy 09/15/2018   vaginal Tandem and Ring HDR 09/15/2018 Dr Antony Blackbird   Intermittent diarrhea    secondary to radiation therapy   Malignant neoplasm of overlapping sites of cervix [facility]) oncologist-- dr gorsuch/  dr Roselind Messier   dx 07-28-2018--- Stage IB3--  completed chemotherapy 09-07-2018 and IMRT 09-08-2018   Migraine    Neuropathic pain    Pancytopenia, acquired  (HCC)    Pelvic mass in female 07/28/2018    Past Surgical History:  Procedure Laterality Date   IR IMAGING GUIDED PORT INSERTION  08/11/2018   IR REMOVAL TUN ACCESS W/ PORT W/O FL MOD SED  11/30/2018   OPERATIVE ULTRASOUND N/A 09/15/2018   Procedure: OPERATIVE ULTRASOUND;  Surgeon: Antony Blackbird, MD;  Location: [facility];  Service: Urology;  Laterality: N/A;   ROBOTIC ASSISTED TOTAL HYSTERECTOMY WITH BILATERAL SALPINGO OOPHERECTOMY Bilateral 10/29/2018   Procedure: XI ROBOTIC ASSISTED TOTAL HYSTERECTOMY WITH BILATERAL SALPINGO OOPHORECTOMY;  Surgeon: Adolphus Birchwood, MD;  Location: [facility];  Service: Gynecology;  Laterality: Bilateral;   TANDEM RING INSERTION N/A 09/15/2018   Procedure: TANDEM RING INSERTION;  Surgeon: Antony Blackbird, MD;  Location: [facility];  Service: Urology;  Laterality: N/A;   WISDOM TOOTH EXTRACTION      Family History  Problem Relation Age of Onset   Multiple sclerosis Mother    Heart disease Father    Multiple sclerosis Sister    Heart disease Paternal Uncle    Heart disease Paternal Grandfather    Colon cancer Neg Hx    Breast cancer Neg Hx    Ovarian cancer Neg Hx    Endometrial cancer Neg Hx    Pancreatic cancer Neg Hx    Prostate cancer Neg Hx     Social History  Socioeconomic History   Marital status: Married    Spouse name: Jerolyn Shin   Number of children: 3   Years of education: 12   Highest education level: Not on file  Occupational History   Not on file  Tobacco Use   Smoking status: Former    Current packs/day: 0.00    Types: Cigarettes    Start date: 02/15/2010    Quit date: 02/15/2018    Years since quitting: 4.5   Smokeless tobacco: Never  Vaping Use   Vaping status: Never Used  Substance and Sexual Activity   Alcohol use: Not Currently   Drug use: Never   Sexual activity: Yes    Birth control/protection: Other-see comments    Comment: husband ---- vasectomy ;  abstinence since 06/ 2020  Other Topics  Concern   Not on file  Social History Narrative   Lives with husband, children   Caffeine ,limited   Social Determinants of Health   Financial Resource Strain: Not on file  Food Insecurity: Not on file  Transportation Needs: Not on file  Physical Activity: Not on file  Stress: Not on file  Social Connections: Not on file    Current Medications:  Current Outpatient Medications:    Misc Natural Products (T-RELIEF CBD+13) SUBL, Place under the tongue., Disp: , Rfl:    Multiple Vitamin (MULTIVITAMIN WITH MINERALS) TABS tablet, Take 1 tablet by mouth daily., Disp: , Rfl:    Probiotic Product (PROBIOTIC PO), Take by mouth., Disp: , Rfl:   Review of Systems: Denies appetite changes, fevers, chills, fatigue, unexplained weight changes. Denies hearing loss, neck lumps or masses, mouth sores, ringing in ears or voice changes. Denies cough or wheezing.  Denies shortness of breath. Denies chest pain or palpitations. Denies leg swelling. Denies abdominal distention, blood in stools, constipation, diarrhea, nausea, vomiting, or early satiety. Denies pain with intercourse, dysuria, frequency, hematuria or incontinence. Denies hot flashes, pelvic pain, vaginal bleeding or vaginal discharge.   Denies joint pain, back pain or muscle pain/cramps. Denies itching, rash, or wounds. Denies dizziness, headaches, numbness or seizures. Denies swollen lymph nodes or glands, denies easy bruising or bleeding. Denies anxiety, depression, confusion, or decreased concentration.  Physical Exam: BP (!) 111/54   Pulse 73   Temp 98.6 F (37 C)   Resp 20   Wt 112 lb 9.6 oz (51.1 kg)   SpO2 99%   BMI 21.28 kg/m  General: Alert, oriented, no acute distress. HEENT: Normocephalic, atraumatic, sclera anicteric. Chest: Clear to auscultation bilaterally.  No wheezes or rhonchi. Cardiovascular: Regular rate and rhythm, no murmurs. Abdomen: soft, nontender.  Normoactive bowel sounds.  No masses or  hepatosplenomegaly appreciated.  Well-healed incisions, some scar tissue palpated under left upper quadrant incision. Extremities: Grossly normal range of motion.  Warm, well perfused.  No edema bilaterally. Skin: No rashes or lesions noted. Lymphatics: No cervical, supraclavicular, or inguinal adenopathy. GU: Normal appearing external genitalia without erythema, excoriation, or lesions.  Speculum exam reveals vaginal cuff is smooth, no lesions or atypical vascularity noted.  Bimanual exam reveals no masses or nodularity.  Rectovaginal exam confirms these findings.  Laboratory & Radiologic Studies: 08/2021: PAP - NIML, HR HPV +  08/2021: A. VAGINAL CUFF, BIOPSY:  - Low-grade squamous intraepithelial lesion (VaIN1, low grade dysplasia)   Assessment & Plan: Jordan Waters is a 47 y.o. woman with a history of stage IB3 endocervical adenocarcinoma s/p primary chemoradiation completed 09/21/18, s/p completion extrafascial hysterectomy on 10/29/2018 with minimal (focal) residual carcinoma.  No evidence of recurrence on today's exam.  Overall doing well.   Cotesting performed today.  I will release these results to her MyChart once back.   She will follow-up with Dr Roselind Messier in 6 months and return to see me in 12 months.  We discussed signs and symptoms that should prompt a phone call before her next visit.  22 minutes of total time was spent for this patient encounter, including preparation, face-to-face counseling with the patient and coordination of care, and documentation of the encounter.  Eugene Garnet, MD  Division of Gynecologic Oncology  Department of Obstetrics and Gynecology  [facility]

## 2022-09-20 NOTE — Patient Instructions (Signed)
It was good to see you today.  I do not see or feel any evidence of cancer recurrence on your exam.  I will see you for follow-up in 12 months.  Please call my office sometime in the spring to schedule a visit to see me this time next year.  I will let you know when I get your Pap test back from today.  As always, if you develop any new and concerning symptoms before your next visit, please call to see me sooner.

## 2022-09-27 ENCOUNTER — Ambulatory Visit (INDEPENDENT_AMBULATORY_CARE_PROVIDER_SITE_OTHER): Payer: 59 | Admitting: Nurse Practitioner

## 2022-09-27 ENCOUNTER — Encounter: Payer: Self-pay | Admitting: Nurse Practitioner

## 2022-09-27 VITALS — BP 116/78 | HR 72 | Ht 61.0 in | Wt 113.0 lb

## 2022-09-27 DIAGNOSIS — R1012 Left upper quadrant pain: Secondary | ICD-10-CM | POA: Insufficient documentation

## 2022-09-27 DIAGNOSIS — Z13220 Encounter for screening for lipoid disorders: Secondary | ICD-10-CM

## 2022-09-27 LAB — CYTOLOGY - PAP
Comment: NEGATIVE
Diagnosis: NEGATIVE
Diagnosis: REACTIVE
High risk HPV: NEGATIVE

## 2022-09-27 NOTE — Assessment & Plan Note (Signed)
-   US Abdomen Complete; Future - DG Chest 2 View - CBC - Comprehensive metabolic panel   Follow up:  Follow up in 6 months

## 2022-09-27 NOTE — Patient Instructions (Addendum)
1. Left upper quadrant pain   - US Abdomen Complete; Future - DG Chest 2 View - CBC - Comprehensive metabolic panel   Follow up:  Follow up in 6 months

## 2022-09-27 NOTE — Progress Notes (Signed)
@Patient  ID: Jordan Waters, female    DOB: 08/19/1975, 47 y.o.   MRN: [id number]  Chief Complaint  Patient presents with   Establish Care    Referring provider: No ref. provider found   HPI  47 year old female with history of cervical cancer.  Patient presents today to establish care.  Patient is followed by GYN oncology for history of cervical cancer.  Patient complains today of chronic pain.  She states that she is having a lot of pain in her left region.  Left upper quadrant ultrasound and chest x-ray today.  Patient is not currently on any prescription medications.  She does take over-the-counter supplements such as multivitamin and probiotics.  She does use CBD oil sublingually for pain.  Patient does also like to do yoga and meditation for pain management. denies f/c/s, n/v/d, hemoptysis, PND, leg swelling Denies chest pain or edema   No Known Allergies   There is no immunization history on file for this patient.  Past Medical History:  Diagnosis Date   Anemia    Chemotherapy-induced nausea and vomiting    intermittant   Depression with anxiety    History of cancer chemotherapy    cervical cancer--- 08-17-2018 to 09-07-2018   History of external beam radiation therapy    cervical cancer--- 07-31-2018 to 09-08-2018   History of radiation therapy 09/15/2018   vaginal Tandem and Ring HDR 09/15/2018 Dr Antony Blackbird   Intermittent diarrhea    secondary to radiation therapy   Malignant neoplasm of overlapping sites of cervix [facility]) oncologist-- dr gorsuch/  dr Roselind Messier   dx 07-28-2018--- Stage IB3--  completed chemotherapy 09-07-2018 and IMRT 09-08-2018   Migraine    Neuropathic pain    Pancytopenia, acquired (HCC)    Pelvic mass in female 07/28/2018    Tobacco History: Social History   Tobacco Use  Smoking Status Former   Current packs/day: 0.00   Types: Cigarettes   Start date: 02/15/2010   Quit date: 02/15/2018   Years since quitting: 4.6  Smokeless Tobacco  Never   Counseling given: Not Answered   Outpatient Encounter Medications as of 09/27/2022  Medication Sig   Misc Natural Products (T-RELIEF CBD+13) SUBL Place under the tongue.   Multiple Vitamin (MULTIVITAMIN WITH MINERALS) TABS tablet Take 1 tablet by mouth daily.   Probiotic Product (PROBIOTIC PO) Take by mouth.   No facility-administered encounter medications on file as of 09/27/2022.     Review of Systems  Review of Systems  Constitutional: Negative.   HENT: Negative.    Cardiovascular: Negative.   Gastrointestinal:  Positive for abdominal pain.  Genitourinary:  Positive for flank pain.  Allergic/Immunologic: Negative.   Neurological: Negative.   Psychiatric/Behavioral: Negative.         Physical Exam  BP 116/78 (BP Location: Left Arm, Patient Position: Sitting)   Pulse 72   Ht 5\' 1"  (1.549 m)   Wt 113 lb (51.3 kg)   SpO2 98%   BMI 21.35 kg/m   Wt Readings from Last 5 Encounters:  09/27/22 113 lb (51.3 kg)  09/20/22 112 lb 9.6 oz (51.1 kg)  03/21/22 116 lb (52.6 kg)  09/20/21 100 lb 4.8 oz (45.5 kg)  08/24/21 102 lb 3 oz (46.4 kg)     Physical Exam Vitals and nursing note reviewed.  Constitutional:      General: She is not in acute distress.    Appearance: She is well-developed.  Cardiovascular:     Rate and Rhythm: Normal rate and regular  rhythm.  Pulmonary:     Effort: Pulmonary effort is normal.     Breath sounds: Normal breath sounds.  Neurological:     Mental Status: She is alert and oriented to person, place, and time.      Lab Results:  CBC    Component Value Date/Time   WBC 3.4 (L) 11/30/2018 1010   RBC 3.80 (L) 11/30/2018 1010   HGB 12.3 11/30/2018 1010   HGB 8.8 (L) 09/07/2018 0829   HGB 12.5 04/08/2018 1641   HCT 37.1 11/30/2018 1010   HCT 37.1 04/08/2018 1641   PLT 235 11/30/2018 1010   PLT 120 (L) 09/07/2018 0829   PLT 352 04/08/2018 1641   MCV 97.6 11/30/2018 1010   MCV 96 04/08/2018 1641   MCH 32.4 11/30/2018 1010    MCHC 33.2 11/30/2018 1010   RDW 12.7 11/30/2018 1010   RDW 11.3 (L) 04/08/2018 1641   LYMPHSABS 0.3 (L) 09/21/2018 0830   LYMPHSABS 1.8 04/08/2018 1641   MONOABS 0.1 09/21/2018 0830   EOSABS 0.0 09/21/2018 0830   EOSABS 0.1 04/08/2018 1641   BASOSABS 0.0 09/21/2018 0830   BASOSABS 0.0 04/08/2018 1641    BMET    Component Value Date/Time   NA 141 10/26/2018 1152   NA 140 03/20/2018 1237   K 4.1 10/26/2018 1152   CL 107 10/26/2018 1152   CO2 26 10/26/2018 1152   GLUCOSE 76 10/26/2018 1152   BUN 6 10/26/2018 1152   BUN 7 03/20/2018 1237   CREATININE 0.65 10/26/2018 1152   CREATININE 0.71 08/13/2018 1614   CALCIUM 9.4 10/26/2018 1152   GFRNONAA >60 10/26/2018 1152   GFRNONAA >60 08/13/2018 1614   GFRAA >60 10/26/2018 1152   GFRAA >60 08/13/2018 1614     Assessment & Plan:   Left upper quadrant pain - US Abdomen Complete; Future - DG Chest 2 View - CBC - Comprehensive metabolic panel   Follow up:  Follow up in 6 months     Ivonne Andrew, NP 09/27/2022

## 2022-09-28 LAB — LIPID PANEL
Chol/HDL Ratio: 4.4 ratio (ref 0.0–4.4)
Cholesterol, Total: 57 mg/dL — ABNORMAL LOW (ref 100–199)
HDL: 13 mg/dL — ABNORMAL LOW (ref >39)
LDL Chol Calc (NIH): 7 mg/dL (ref 0–99)
Triglycerides: 246 mg/dL — ABNORMAL HIGH (ref 0–149)
VLDL Cholesterol Cal: 37 mg/dL (ref 5–40)

## 2022-09-28 LAB — COMPREHENSIVE METABOLIC PANEL
ALT: 37 IU/L — ABNORMAL HIGH (ref 0–32)
AST: 96 IU/L — ABNORMAL HIGH (ref 0–40)
Albumin: 3.4 g/dL — ABNORMAL LOW (ref 3.9–4.9)
Alkaline Phosphatase: 158 IU/L — ABNORMAL HIGH (ref 44–121)
BUN/Creatinine Ratio: 7 — ABNORMAL LOW (ref 9–23)
BUN: 8 mg/dL (ref 6–24)
Bilirubin Total: 1.6 mg/dL — ABNORMAL HIGH (ref 0.0–1.2)
CO2: 25 mmol/L (ref 20–29)
Calcium: 8.2 mg/dL — ABNORMAL LOW (ref 8.7–10.2)
Chloride: 105 mmol/L (ref 96–106)
Creatinine, Ser: 1.21 mg/dL — ABNORMAL HIGH (ref 0.57–1.00)
Globulin, Total: 4.6 g/dL — ABNORMAL HIGH (ref 1.5–4.5)
Glucose: 96 mg/dL (ref 70–99)
Potassium: 4.4 mmol/L (ref 3.5–5.2)
Sodium: 140 mmol/L (ref 134–144)
Total Protein: 8 g/dL (ref 6.0–8.5)
eGFR: 56 mL/min/{1.73_m2} — ABNORMAL LOW (ref >59)

## 2022-09-28 LAB — CBC
Hematocrit: 38.8 % (ref 34.0–46.6)
Hemoglobin: 12.5 g/dL (ref 11.1–15.9)
MCH: 27.2 pg (ref 26.6–33.0)
MCHC: 32.2 g/dL (ref 31.5–35.7)
MCV: 84 fL (ref 79–97)
Platelets: 377 10*3/uL (ref 150–450)
RBC: 4.6 x10E6/uL (ref 3.77–5.28)
RDW: 12.7 % (ref 11.7–15.4)
WBC: 8.1 10*3/uL (ref 3.4–10.8)

## 2022-10-02 ENCOUNTER — Other Ambulatory Visit: Payer: Self-pay | Admitting: Nurse Practitioner

## 2022-10-02 ENCOUNTER — Encounter: Payer: Self-pay | Admitting: Physician Assistant

## 2022-10-02 DIAGNOSIS — R7989 Other specified abnormal findings of blood chemistry: Secondary | ICD-10-CM

## 2022-10-02 DIAGNOSIS — R1012 Left upper quadrant pain: Secondary | ICD-10-CM

## 2022-10-10 ENCOUNTER — Ambulatory Visit
Admission: RE | Admit: 2022-10-10 | Discharge: 2022-10-10 | Disposition: A | Discharge status: Home / Self Care | Payer: 59 | Source: Ambulatory Visit | Attending: Nurse Practitioner | Admitting: Nurse Practitioner

## 2022-10-10 DIAGNOSIS — R1012 Left upper quadrant pain: Secondary | ICD-10-CM

## 2022-10-10 NOTE — Progress Notes (Signed)
Patient viewed results and note on 10/10/22 at 12:03 pm

## 2022-10-14 ENCOUNTER — Institutional Professional Consult (permissible substitution): Payer: Self-pay | Admitting: Clinical

## 2022-10-24 ENCOUNTER — Ambulatory Visit (INDEPENDENT_AMBULATORY_CARE_PROVIDER_SITE_OTHER): Payer: 59 | Admitting: Clinical

## 2022-10-24 DIAGNOSIS — R52 Pain, unspecified: Secondary | ICD-10-CM

## 2022-10-24 NOTE — BH Specialist Note (Signed)
Integrated Behavioral Health Referral Note  10/24/2022 Name: Jordan Waters MRN: [id number] DOB: [birth year] Jordan Waters is a 47 y.o. year old female who sees Pcp, No for primary care. LCSW was initially consulted to  Assess the patient's needs and assist with mental health resources, particularly mindfulness.  **Not a billable visit**  Interpreter: No.   Interpreter Name & Language: none  Assessment: Patient experiencing pain in her upper body. She suspects it is related to recent lab tests that were abnormal. Her PCP has now referred her to GI.  Ongoing Intervention: CSW and patient met at the [facility]. Discussed patient's needs and experience with her pain. She has a long history of utilizing holistic methods for health and healing, such as yoga, mindfulness, and diet changes. She does also utilize Teaching laboratory technician. Provided reflective listening today around patient's experience, as well as emotional validation. Discussed additional mindfulness practices that patient can add to her existing practice. Patient declined to schedule additional appointment. She will reach out to [place] in the future if needed.  Abigail Butts, LCSW [facility] [phone]

## 2022-12-11 NOTE — Progress Notes (Signed)
12/13/2022 Jordan Waters [id number] 03-Sep-1975  Referring provider: Ivonne Andrew, NP Primary GI doctor: Dr. Chales Abrahams  ASSESSMENT AND PLAN:   Abdominal pain, epigastric with nausea and vomiting with elevated LFTs 09/2022 abdominal ultrasound no cholecystitis unremarkable liver pancreas spleen Will repeat c-Met, advised to stop tumeric over-the-counter Will get autoimmune labs, hepatitis panel Potentially from IBS with normal stools to diarrhea, patient is overdue for colonoscopy I recommend upper gastrointestinal and colorectal evaluation with an EGD and colonoscopy.  Risk of bowel prep, conscious sedation, and EGD and colonoscopy were discussed.  Risks include but are not limited to dehydration, pain, bleeding, cardiopulmonary process, bowel perforation, or other possible adverse outcomes..  Treatment plan was discussed with patient, and agreed upon. Pending labs can consider CT versus HIDA FODMAP diet given, Ibgard   Patient Care Team: Pcp, No as PCP - General  HISTORY OF PRESENT ILLNESS: 47 y.o. female with a past medical history of cervical cancer status postchemotherapy and radiation 2020, migraines, neuropathic pain and others listed below presents as a new patient for left upper quadrant abdominal discomfort with abnormal LFTs.  Unremarkable ANA 06/13/2020, normal ferritin in 2020 09/27/2022 CBC without anemia or leukocytosis, BUN 8, creatinine 1.21 with baseline 4 years prior creatinine 0.65 Albumin 3.4, total bilirubin 1.6, alkaline phosphatase 158, AST 96, ALT 37. 10/10/2022 abdominal ultrasound shows no cholelithiasis no evidence of cholecystitis unremarkable liver, normal pancreas and spleen.  No family history of liver issues or cancer.  No family issues of gallbladder.   She states from COVID in 2020 she started to have neurological issues, will have pain and will feel . She states she was having some minor epigastric pain after her hysterectomy but got  progressively worse.   She was placed on Cymbalta and gabapentin by neurologist for pain and had increased the dose until she was feeling sick she was having worsening nausea, vomiting, fatigue/drugged. She has stopped complete with improvement in her fatigue but continues to have phases of nausea, when this happens she will have epigastric AB pain across her AB.  She has constant epigastric pain but can he severe at time, with a cresendo of epigastric pain, nausea, sweating. Can be up to 3-4 x a day.  She started eating by your blood type and states this has helped.  She states it does not compare to birth the pain can be so severe.  Her stool is very sporadic. Can have diarrhea with illness, but can have normal stools.  She is trying to increase her fiber/nutrition. States she will feel like she can not physcially eat at time due to the nausea.   She  reports that she quit smoking about 4 years ago. Her smoking use included cigarettes. She started smoking about 12 years ago. She has never used smokeless tobacco. She reports that she does not currently use alcohol. She reports that she does not use drugs.  RELEVANT LABS AND IMAGING: Status CBC    Component Value Date/Time   WBC 8.1 09/27/2022 1014   WBC 3.4 (L) 11/30/2018 1010   RBC 4.60 09/27/2022 1014   RBC 3.80 (L) 11/30/2018 1010   HGB 12.5 09/27/2022 1014   HCT 38.8 09/27/2022 1014   PLT 377 09/27/2022 1014   MCV 84 09/27/2022 1014   MCH 27.2 09/27/2022 1014   MCH 32.4 11/30/2018 1010   MCHC 32.2 09/27/2022 1014   MCHC 33.2 11/30/2018 1010   RDW 12.7 09/27/2022 1014   LYMPHSABS 0.3 (L) 09/21/2018 0830  LYMPHSABS 1.8 04/08/2018 1641   MONOABS 0.1 09/21/2018 0830   EOSABS 0.0 09/21/2018 0830   EOSABS 0.1 04/08/2018 1641   BASOSABS 0.0 09/21/2018 0830   BASOSABS 0.0 04/08/2018 1641   Recent Labs    09/27/22 1014  HGB 12.5    CMP     Component Value Date/Time   NA 140 09/27/2022 1014   K 4.4 09/27/2022 1014   CL 105  09/27/2022 1014   CO2 25 09/27/2022 1014   GLUCOSE 96 09/27/2022 1014   GLUCOSE 76 10/26/2018 1152   BUN 8 09/27/2022 1014   CREATININE 1.21 (H) 09/27/2022 1014   CREATININE 0.71 08/13/2018 1614   CALCIUM 8.2 (L) 09/27/2022 1014   PROT 8.0 09/27/2022 1014   ALBUMIN 3.4 (L) 09/27/2022 1014   AST 96 (H) 09/27/2022 1014   AST 13 (L) 08/13/2018 1614   ALT 37 (H) 09/27/2022 1014   ALT 8 08/13/2018 1614   ALKPHOS 158 (H) 09/27/2022 1014   BILITOT 1.6 (H) 09/27/2022 1014   BILITOT 0.3 08/13/2018 1614   GFRNONAA >60 10/26/2018 1152   GFRNONAA >60 08/13/2018 1614   GFRAA >60 10/26/2018 1152   GFRAA >60 08/13/2018 1614      Latest Ref Rng & Units 09/27/2022   10:14 AM 10/26/2018   11:52 AM 09/14/2018    7:56 AM  Hepatic Function  Total Protein 6.0 - 8.5 g/dL 8.0  7.1  6.4   Albumin 3.9 - 4.9 g/dL 3.4  4.4  3.7   AST 0 - 40 IU/L 96  15  10   ALT 0 - 32 IU/L 37  14  10   Alk Phosphatase 44 - 121 IU/L 158  43  54   Total Bilirubin 0.0 - 1.2 mg/dL 1.6  0.5  0.3       Current Medications:        Current Outpatient Medications (Other):    Misc Natural Products (T-RELIEF CBD+13) SUBL, Place under the tongue.   Multiple Vitamin (MULTIVITAMIN WITH MINERALS) TABS tablet, Take 1 tablet by mouth daily.   Na Sulfate-K Sulfate-Mg Sulf 17.5-3.13-1.6 GM/177ML SOLN, Use as directed; may use generic; goodrx card if insurance will not cover generic   OVER THE COUNTER MEDICATION, Algae version of omega daily   Probiotic Product (PROBIOTIC PO), Take by mouth.  Medical History:  Past Medical History:  Diagnosis Date   Anemia    Chemotherapy-induced nausea and vomiting    intermittant   Depression with anxiety    History of cancer chemotherapy    cervical cancer--- 08-17-2018 to 09-07-2018   History of external beam radiation therapy    cervical cancer--- 07-31-2018 to 09-08-2018   History of radiation therapy 09/15/2018   vaginal Tandem and Ring HDR 09/15/2018 Dr Antony Blackbird    Intermittent diarrhea    secondary to radiation therapy   Malignant neoplasm of overlapping sites of cervix [facility]) oncologist-- dr gorsuch/  dr Roselind Messier   dx 07-28-2018--- Stage IB3--  completed chemotherapy 09-07-2018 and IMRT 09-08-2018   Migraine    Neuropathic pain    Pancytopenia, acquired (HCC)    Pelvic mass in female 07/28/2018   Allergies: No Known Allergies   Surgical History:  She  has a past surgical history that includes Wisdom tooth extraction; IR IMAGING GUIDED PORT INSERTION (08/11/2018); Tandem ring insertion (N/A, 09/15/2018); Operative ultrasound (N/A, 09/15/2018); Robotic assisted total hysterectomy with bilateral salpingo oophorectomy (Bilateral, 10/29/2018); and IR REMOVAL TUN ACCESS W/ PORT W/O FL MOD SED (11/30/2018). Family History:  Her family history includes Heart disease in her father, paternal grandfather, and paternal uncle; Multiple sclerosis in her brother and mother.  REVIEW OF SYSTEMS  : All other systems reviewed and negative except where noted in the History of Present Illness.  PHYSICAL EXAM: BP 106/60   Pulse 70   Ht 5\' 1"  (1.549 m)   Wt 105 lb (47.6 kg)   BMI 19.84 kg/m  General Appearance: Well nourished, in no apparent distress. Head:   Normocephalic and atraumatic. Eyes:  sclerae anicteric,conjunctive pink  Respiratory: Respiratory effort normal, BS equal bilaterally without rales, rhonchi, wheezing. Cardio: RRR with no MRGs. Peripheral pulses intact.  Abdomen: Soft,  Non-distended ,active bowel sounds. moderate tenderness in the epigastrium. Without guarding and Without rebound. No masses. Rectal: Not evaluated Musculoskeletal: Full ROM, Normal gait. Without edema. Skin:  Dry and intact without significant lesions or rashes Neuro: Alert and  oriented x4;  No focal deficits. Psych:  Cooperative. Normal mood and affect.    Doree Albee, PA-C 1:38 PM

## 2022-12-13 ENCOUNTER — Ambulatory Visit (INDEPENDENT_AMBULATORY_CARE_PROVIDER_SITE_OTHER): Payer: 59 | Admitting: Physician Assistant

## 2022-12-13 ENCOUNTER — Other Ambulatory Visit: Payer: Self-pay

## 2022-12-13 ENCOUNTER — Other Ambulatory Visit (INDEPENDENT_AMBULATORY_CARE_PROVIDER_SITE_OTHER): Payer: 59

## 2022-12-13 ENCOUNTER — Encounter: Payer: Self-pay | Admitting: Physician Assistant

## 2022-12-13 VITALS — BP 106/60 | HR 70 | Ht 61.0 in | Wt 105.0 lb

## 2022-12-13 DIAGNOSIS — R195 Other fecal abnormalities: Secondary | ICD-10-CM

## 2022-12-13 DIAGNOSIS — R1013 Epigastric pain: Secondary | ICD-10-CM

## 2022-12-13 DIAGNOSIS — R7989 Other specified abnormal findings of blood chemistry: Secondary | ICD-10-CM

## 2022-12-13 DIAGNOSIS — R112 Nausea with vomiting, unspecified: Secondary | ICD-10-CM | POA: Diagnosis not present

## 2022-12-13 LAB — HEPATIC FUNCTION PANEL
ALT: 9 U/L (ref 0–35)
AST: 14 U/L (ref 0–37)
Albumin: 4.7 g/dL (ref 3.5–5.2)
Alkaline Phosphatase: 63 U/L (ref 39–117)
Bilirubin, Direct: 0.1 mg/dL (ref 0.0–0.3)
Total Bilirubin: 0.5 mg/dL (ref 0.2–1.2)
Total Protein: 7.3 g/dL (ref 6.0–8.3)

## 2022-12-13 LAB — CBC WITH DIFFERENTIAL/PLATELET
Basophils Absolute: 0 10*3/uL (ref 0.0–0.1)
Basophils Relative: 0.5 % (ref 0.0–3.0)
Eosinophils Absolute: 0.1 10*3/uL (ref 0.0–0.7)
Eosinophils Relative: 1.5 % (ref 0.0–5.0)
HCT: 42.1 % (ref 36.0–46.0)
Hemoglobin: 14.1 g/dL (ref 12.0–15.0)
Lymphocytes Relative: 22.4 % (ref 12.0–46.0)
Lymphs Abs: 1.6 10*3/uL (ref 0.7–4.0)
MCHC: 33.4 g/dL (ref 30.0–36.0)
MCV: 94.7 fL (ref 78.0–100.0)
Monocytes Absolute: 0.4 10*3/uL (ref 0.1–1.0)
Monocytes Relative: 4.9 % (ref 3.0–12.0)
Neutro Abs: 5.2 10*3/uL (ref 1.4–7.7)
Neutrophils Relative %: 70.7 % (ref 43.0–77.0)
Platelets: 355 10*3/uL (ref 150.0–400.0)
RBC: 4.45 Mil/uL (ref 3.87–5.11)
RDW: 13 % (ref 11.5–15.5)
WBC: 7.3 10*3/uL (ref 4.0–10.5)

## 2022-12-13 LAB — BASIC METABOLIC PANEL
BUN: 9 mg/dL (ref 6–23)
CO2: 29 meq/L (ref 19–32)
Calcium: 9.7 mg/dL (ref 8.4–10.5)
Chloride: 105 meq/L (ref 96–112)
Creatinine, Ser: 0.6 mg/dL (ref 0.40–1.20)
GFR: 106.46 mL/min (ref >60.00)
Glucose, Bld: 92 mg/dL (ref 70–99)
Potassium: 3.7 meq/L (ref 3.5–5.1)
Sodium: 140 meq/L (ref 135–145)

## 2022-12-13 LAB — HIGH SENSITIVITY CRP: CRP, High Sensitivity: 0.31 mg/L (ref 0.000–5.000)

## 2022-12-13 LAB — IBC + FERRITIN
Ferritin: 22.3 ng/mL (ref 10.0–291.0)
Iron: 116 ug/dL (ref 42–145)
Saturation Ratios: 31.4 % (ref 20.0–50.0)
TIBC: 369.6 ug/dL (ref 250.0–450.0)
Transferrin: 264 mg/dL (ref 212.0–360.0)

## 2022-12-13 LAB — GAMMA GT: GGT: 10 U/L (ref 7–51)

## 2022-12-13 MED ORDER — NA SULFATE-K SULFATE-MG SULF 17.5-3.13-1.6 GM/177ML PO SOLN: ORAL | 0 refills | Status: DC

## 2022-12-13 NOTE — Patient Instructions (Addendum)
Your provider has requested that you go to the basement level for lab work before leaving today. Press "B" on the elevator. The lab is located at the first door on the left as you exit the elevator.  You have been scheduled for an endoscopy and colonoscopy. Please follow the written instructions given to you at your visit today.  Please pick up your prep supplies at the pharmacy within the next 1-3 days.  If you use inhalers (even only as needed), please bring them with you on the day of your procedure.  DO NOT TAKE 7 DAYS PRIOR TO TEST- Trulicity (dulaglutide) Ozempic, Wegovy (semaglutide) Mounjaro (tirzepatide) Bydureon Bcise (exanatide extended release)  DO NOT TAKE 1 DAY PRIOR TO YOUR TEST Rybelsus (semaglutide) Adlyxin (lixisenatide) Victoza (liraglutide) Byetta (exanatide)  We have sent the following medications to your pharmacy for you to pick up at your convenience: Suprep  For IBS and peppermint oil.  Peppermint oil has been proven to be better than placebo for cramping for IBS Stop if it worsens heart burn or causes flushing of your face.  Ideally enteric coated peppermint oil capsules 2 a day is best but if you got the oil, you can use 0.62ml or 180 mg of pepperment oil up to 3 x a day.  Increase activity Can do trial of IBGard which is over the counter for AB pain- Take 1-2 capsules once a day for maintence or twice a day during a flare    FODMAP stands for fermentable oligo-, di-, mono-saccharides and polyols (1). These are the scientific terms used to classify groups of carbs that are difficult for our body to digest and that are notorious for triggering digestive symptoms like bloating, gas, loose stools and stomach pain.   You can try low FODMAP diet  - start with eliminating just one column at a time that you feel may be a trigger for you. - the table at the very bottom contains foods that are low in FODMAPs   Sometimes trying to eliminate the FODMAP's from your  diet is difficult or tricky, if you are stuggling with trying to do the elimination diet you can try an enzyme.  There is a food enzymes that you sprinkle in or on your food that helps break down the FODMAP. You can read more about the enzyme by going to this site: https://fodzyme.com/

## 2022-12-18 LAB — MITOCHONDRIAL ANTIBODIES: Mitochondrial M2 Ab, IgG: <=20 U (ref <=20.0)

## 2022-12-18 LAB — HEPATITIS A ANTIBODY, TOTAL: Hepatitis A AB,Total: NONREACTIVE

## 2022-12-18 LAB — IGA: Immunoglobulin A: 126 mg/dL (ref 47–310)

## 2022-12-18 LAB — HEPATITIS B SURFACE ANTIBODY,QUALITATIVE: Hep B S Ab: NONREACTIVE

## 2022-12-18 LAB — ANTI-SMOOTH MUSCLE ANTIBODY, IGG: Actin (Smooth Muscle) Antibody (IGG): <20 U (ref <20)

## 2022-12-18 LAB — IGG: IgG (Immunoglobin G), Serum: 928 mg/dL (ref 600–1640)

## 2022-12-18 LAB — CERULOPLASMIN: Ceruloplasmin: 23 mg/dL (ref 14–48)

## 2022-12-18 LAB — HEPATITIS C ANTIBODY: Hepatitis C Ab: NONREACTIVE

## 2022-12-18 LAB — TISSUE TRANSGLUTAMINASE, IGA: (tTG) Ab, IgA: <1 U/mL

## 2022-12-18 LAB — HEPATITIS B CORE ANTIBODY, TOTAL: Hep B Core Total Ab: NONREACTIVE

## 2022-12-18 LAB — HEPATITIS B SURFACE ANTIGEN: Hepatitis B Surface Ag: NONREACTIVE

## 2022-12-18 LAB — ALPHA-1-ANTITRYPSIN: A-1 Antitrypsin, Ser: 148 mg/dL (ref 83–199)

## 2023-01-05 NOTE — Progress Notes (Signed)
Agree with assessment/plan.  Raj Florestine Carmical, MD [facility] [phone]  

## 2023-03-07 ENCOUNTER — Ambulatory Visit: Payer: 59 | Admitting: Gastroenterology

## 2023-03-07 ENCOUNTER — Encounter: Payer: Self-pay | Admitting: Gastroenterology

## 2023-03-07 VITALS — BP 103/54 | HR 66 | Temp 97.7°F | Resp 14 | Ht 61.0 in | Wt 105.0 lb

## 2023-03-07 DIAGNOSIS — R1013 Epigastric pain: Secondary | ICD-10-CM

## 2023-03-07 DIAGNOSIS — K5730 Diverticulosis of large intestine without perforation or abscess without bleeding: Secondary | ICD-10-CM

## 2023-03-07 DIAGNOSIS — Z1211 Encounter for screening for malignant neoplasm of colon: Secondary | ICD-10-CM | POA: Diagnosis present

## 2023-03-07 DIAGNOSIS — K2980 Duodenitis without bleeding: Secondary | ICD-10-CM | POA: Diagnosis not present

## 2023-03-07 DIAGNOSIS — K6389 Other specified diseases of intestine: Secondary | ICD-10-CM

## 2023-03-07 DIAGNOSIS — K640 First degree hemorrhoids: Secondary | ICD-10-CM | POA: Diagnosis not present

## 2023-03-07 DIAGNOSIS — R112 Nausea with vomiting, unspecified: Secondary | ICD-10-CM

## 2023-03-07 DIAGNOSIS — R7989 Other specified abnormal findings of blood chemistry: Secondary | ICD-10-CM

## 2023-03-07 DIAGNOSIS — R195 Other fecal abnormalities: Secondary | ICD-10-CM

## 2023-03-07 HISTORY — PX: OTHER SURGICAL HISTORY: SHX169

## 2023-03-07 HISTORY — PX: UPPER GI ENDOSCOPY: SHX6162

## 2023-03-07 MED ORDER — SODIUM CHLORIDE 0.9 % IV SOLN: 500.0000 mL | INTRAVENOUS | Status: DC

## 2023-03-07 NOTE — Progress Notes (Signed)
Called to room to assist during endoscopic procedure.  Patient ID and intended procedure confirmed with present staff. Received instructions for my participation in the procedure from the performing physician.

## 2023-03-07 NOTE — Op Note (Signed)
[facility] Patient Name: Jordan Waters Procedure Date: 03/07/2023 1:03 PM MRN: [id number] Endoscopist: Lynann Bologna , MD, [id number] Age: 48 Referring MD:  Date of Birth: 11/28/1975 Gender: Female Account #: [id number] Procedure:                Colonoscopy Indications:              Screening for colorectal malignant neoplasm Medicines:                Monitored Anesthesia Care Procedure:                Pre-Anesthesia Assessment:                           - Prior to the procedure, a History and Physical                            was performed, and patient medications and                            allergies were reviewed. The patient's tolerance of                            previous anesthesia was also reviewed. The risks                            and benefits of the procedure and the sedation                            options and risks were discussed with the patient.                            All questions were answered, and informed consent                            was obtained. Prior Anticoagulants: The patient has                            taken no anticoagulant or antiplatelet agents. ASA                            Grade Assessment: II - A patient with mild systemic                            disease. After reviewing the risks and benefits,                            the patient was deemed in satisfactory condition to                            undergo the procedure.                           After obtaining informed consent, the colonoscope  was passed under direct vision. Throughout the                            procedure, the patient's blood pressure, pulse, and                            oxygen saturations were monitored continuously. The                            [id number]-HQ190L Colonoscope [id number] was introduced                            through the anus and advanced to the 4 cm into the                            ileum. The  colonoscopy was performed without                            difficulty. The patient tolerated the procedure                            well. The quality of the bowel preparation was good                            except in the right side of the colon. There was                            some adherent stool in the right colon which could                            not be fully washed. The terminal ileum, ileocecal                            valve, appendiceal orifice, and rectum were                            photographed. Scope In: 1:20:28 PM Scope Out: 1:36:51 PM Scope Withdrawal Time: 0 hours 13 minutes 58 seconds  Total Procedure Duration: 0 hours 16 minutes 23 seconds  Findings:                 A few medium-mouthed diverticula were found in the                            sigmoid colon.                           The colon (entire examined portion) appeared normal.                           Non-bleeding internal hemorrhoids were found during                            retroflexion. The hemorrhoids were small and Grade  I (internal hemorrhoids that do not prolapse).                           A patchy area of the terminal ileum was congested.                            No erosions or ulcers. Biopsies were taken with a                            cold forceps for histology.                           The exam was otherwise without abnormality on                            direct and retroflexion views. Complications:            No immediate complications. Estimated Blood Loss:     Estimated blood loss: none. Impression:               - Mild sigmoid diverticulosis.                           - The entire examined colon is normal.                           - Non-bleeding internal hemorrhoids.                           - Congested mucosa in the terminal ileum. Biopsied.                           - The examination was otherwise normal on direct                             and retroflexion views. Recommendation:           - Patient has a contact number available for                            emergencies. The signs and symptoms of potential                            delayed complications were discussed with the                            patient. Return to normal activities tomorrow.                            Written discharge instructions were provided to the                            patient.                           - Resume previous diet.                           -  Continue present medications.                           - Await pathology results.                           - Repeat colonoscopy in 10 years for screening                            purposes. Earlier, if with any new problems or                            change in family history.                           - If still with problems, follow-up with Marchelle Folks.                           - The findings and recommendations were discussed                            with the patient's family. Lynann Bologna, MD 03/07/2023 1:41:43 PM This report has been signed electronically.

## 2023-03-07 NOTE — Progress Notes (Signed)
12/13/2022 Jordan Waters [id number] 07/15/1975   Referring provider: Ivonne Andrew, NP Primary GI doctor: Dr. Chales Abrahams   ASSESSMENT AND PLAN:    Abdominal pain, epigastric with nausea and vomiting with elevated LFTs 09/2022 abdominal ultrasound no cholecystitis unremarkable liver pancreas spleen Will repeat c-Met, advised to stop tumeric over-the-counter Will get autoimmune labs, hepatitis panel Potentially from IBS with normal stools to diarrhea, patient is overdue for colonoscopy I recommend upper gastrointestinal and colorectal evaluation with an EGD and colonoscopy.  Risk of bowel prep, conscious sedation, and EGD and colonoscopy were discussed.  Risks include but are not limited to dehydration, pain, bleeding, cardiopulmonary process, bowel perforation, or other possible adverse outcomes..  Treatment plan was discussed with patient, and agreed upon. Pending labs can consider CT versus HIDA FODMAP diet given, Ibgard     Patient Care Team: Pcp, No as PCP - General   HISTORY OF PRESENT ILLNESS: 48 y.o. female with a past medical history of cervical cancer status postchemotherapy and radiation 2020, migraines, neuropathic pain and others listed below presents as a new patient for left upper quadrant abdominal discomfort with abnormal LFTs.   Unremarkable ANA 06/13/2020, normal ferritin in 2020 09/27/2022 CBC without anemia or leukocytosis, BUN 8, creatinine 1.21 with baseline 4 years prior creatinine 0.65 Albumin 3.4, total bilirubin 1.6, alkaline phosphatase 158, AST 96, ALT 37. 10/10/2022 abdominal ultrasound shows no cholelithiasis no evidence of cholecystitis unremarkable liver, normal pancreas and spleen.   No family history of liver issues or cancer.  No family issues of gallbladder.    She states from COVID in 2020 she started to have neurological issues, will have pain and will feel . She states she was having some minor epigastric pain after her hysterectomy but got  progressively worse.   She was placed on Cymbalta and gabapentin by neurologist for pain and had increased the dose until she was feeling sick she was having worsening nausea, vomiting, fatigue/drugged. She has stopped complete with improvement in her fatigue but continues to have phases of nausea, when this happens she will have epigastric AB pain across her AB.  She has constant epigastric pain but can he severe at time, with a cresendo of epigastric pain, nausea, sweating. Can be up to 3-4 x a day.  She started eating by your blood type and states this has helped.  She states it does not compare to birth the pain can be so severe.  Her stool is very sporadic. Can have diarrhea with illness, but can have normal stools.  She is trying to increase her fiber/nutrition. States she will feel like she can not physcially eat at time due to the nausea.    She  reports that she quit smoking about 4 years ago. Her smoking use included cigarettes. She started smoking about 12 years ago. She has never used smokeless tobacco. She reports that she does not currently use alcohol. She reports that she does not use drugs.   RELEVANT LABS AND IMAGING: Status CBC Labs (Brief)          Component Value Date/Time    WBC 8.1 09/27/2022 1014    WBC 3.4 (L) 11/30/2018 1010    RBC 4.60 09/27/2022 1014    RBC 3.80 (L) 11/30/2018 1010    HGB 12.5 09/27/2022 1014    HCT 38.8 09/27/2022 1014    PLT 377 09/27/2022 1014    MCV 84 09/27/2022 1014    MCH 27.2 09/27/2022 1014    MCH 32.4  11/30/2018 1010    MCHC 32.2 09/27/2022 1014    MCHC 33.2 11/30/2018 1010    RDW 12.7 09/27/2022 1014    LYMPHSABS 0.3 (L) 09/21/2018 0830    LYMPHSABS 1.8 04/08/2018 1641    MONOABS 0.1 09/21/2018 0830    EOSABS 0.0 09/21/2018 0830    EOSABS 0.1 04/08/2018 1641    BASOSABS 0.0 09/21/2018 0830    BASOSABS 0.0 04/08/2018 1641      Recent Labs (within last 365 days)     Recent Labs    09/27/22 1014  HGB 12.5        CMP      Labs (Brief)          Component Value Date/Time    NA 140 09/27/2022 1014    K 4.4 09/27/2022 1014    CL 105 09/27/2022 1014    CO2 25 09/27/2022 1014    GLUCOSE 96 09/27/2022 1014    GLUCOSE 76 10/26/2018 1152    BUN 8 09/27/2022 1014    CREATININE 1.21 (H) 09/27/2022 1014    CREATININE 0.71 08/13/2018 1614    CALCIUM 8.2 (L) 09/27/2022 1014    PROT 8.0 09/27/2022 1014    ALBUMIN 3.4 (L) 09/27/2022 1014    AST 96 (H) 09/27/2022 1014    AST 13 (L) 08/13/2018 1614    ALT 37 (H) 09/27/2022 1014    ALT 8 08/13/2018 1614    ALKPHOS 158 (H) 09/27/2022 1014    BILITOT 1.6 (H) 09/27/2022 1014    BILITOT 0.3 08/13/2018 1614    GFRNONAA >60 10/26/2018 1152    GFRNONAA >60 08/13/2018 1614    GFRAA >60 10/26/2018 1152    GFRAA >60 08/13/2018 1614          Latest Ref Rng & Units 09/27/2022   10:14 AM 10/26/2018   11:52 AM 09/14/2018    7:56 AM  Hepatic Function  Total Protein 6.0 - 8.5 g/dL 8.0  7.1  6.4   Albumin 3.9 - 4.9 g/dL 3.4  4.4  3.7   AST 0 - 40 IU/L 96  15  10   ALT 0 - 32 IU/L 37  14  10   Alk Phosphatase 44 - 121 IU/L 158  43  54   Total Bilirubin 0.0 - 1.2 mg/dL 1.6  0.5  0.3       Current Medications:              Current Outpatient Medications (Other):    Misc Natural Products (T-RELIEF CBD+13) SUBL, Place under the tongue.   Multiple Vitamin (MULTIVITAMIN WITH MINERALS) TABS tablet, Take 1 tablet by mouth daily.   Na Sulfate-K Sulfate-Mg Sulf 17.5-3.13-1.6 GM/177ML SOLN, Use as directed; may use generic; goodrx card if insurance will not cover generic   OVER THE COUNTER MEDICATION, Algae version of omega daily   Probiotic Product (PROBIOTIC PO), Take by mouth.   Medical History:      Past Medical History:  Diagnosis Date   Anemia     Chemotherapy-induced nausea and vomiting      intermittant   Depression with anxiety     History of cancer chemotherapy      cervical cancer--- 08-17-2018 to 09-07-2018   History of external beam radiation therapy       cervical cancer--- 07-31-2018 to 09-08-2018   History of radiation therapy 09/15/2018    vaginal Tandem and Ring HDR 09/15/2018 Dr Antony Blackbird   Intermittent diarrhea      secondary  to radiation therapy   Malignant neoplasm of overlapping sites of cervix [facility]) oncologist-- dr gorsuch/  dr Roselind Messier    dx 07-28-2018--- Stage IB3--  completed chemotherapy 09-07-2018 and IMRT 09-08-2018   Migraine     Neuropathic pain     Pancytopenia, acquired (HCC)     Pelvic mass in female 07/28/2018        Allergies:  Allergies  No Known Allergies      Surgical History:  She  has a past surgical history that includes Wisdom tooth extraction; IR IMAGING GUIDED PORT INSERTION (08/11/2018); Tandem ring insertion (N/A, 09/15/2018); Operative ultrasound (N/A, 09/15/2018); Robotic assisted total hysterectomy with bilateral salpingo oophorectomy (Bilateral, 10/29/2018); and IR REMOVAL TUN ACCESS W/ PORT W/O FL MOD SED (11/30/2018). Family History:  Her family history includes Heart disease in her father, paternal grandfather, and paternal uncle; Multiple sclerosis in her brother and mother.   REVIEW OF SYSTEMS  : All other systems reviewed and negative except where noted in the History of Present Illness.   PHYSICAL EXAM: BP 106/60   Pulse 70   Ht 5\' 1"  (1.549 m)   Wt 105 lb (47.6 kg)   BMI 19.84 kg/m  General Appearance: Well nourished, in no apparent distress. Head:   Normocephalic and atraumatic. Eyes:  sclerae anicteric,conjunctive pink  Respiratory: Respiratory effort normal, BS equal bilaterally without rales, rhonchi, wheezing. Cardio: RRR with no MRGs. Peripheral pulses intact.  Abdomen: Soft,  Non-distended ,active bowel sounds. moderate tenderness in the epigastrium. Without guarding and Without rebound. No masses. Rectal: Not evaluated Musculoskeletal: Full ROM, Normal gait. Without edema. Skin:  Dry and intact without significant lesions or rashes Neuro: Alert and  oriented x4;  No focal  deficits. Psych:  Cooperative. Normal mood and affect.      Doree Albee, PA-C      Attending physician's note   I have taken history, reviewed the chart and examined the patient. I performed a substantive portion of this encounter, including complete performance of at least one of the key components, in conjunction with the APP. I agree with the Advanced Practitioner's note, impression and recommendations.   For EGD/colon    Edman Circle, MD [facility] [phone]

## 2023-03-07 NOTE — Patient Instructions (Signed)
  Educational handout provided to patient related to Hemorrhoids and Diverticulosis  Resume previous diet  Continue present medications  Awaiting pathology results   YOU HAD AN ENDOSCOPIC PROCEDURE TODAY AT THE [facility]:   Refer to the procedure report that was given to you for any specific questions about what was found during the examination.  If the procedure report does not answer your questions, please call your gastroenterologist to clarify.  If you requested that your care partner not be given the details of your procedure findings, then the procedure report has been included in a sealed envelope for you to review at your convenience later.  YOU SHOULD EXPECT: Some feelings of bloating in the abdomen. Passage of more gas than usual.  Walking can help get rid of the air that was put into your GI tract during the procedure and reduce the bloating. If you had a lower endoscopy (such as a colonoscopy or flexible sigmoidoscopy) you may notice spotting of blood in your stool or on the toilet paper. If you underwent a bowel prep for your procedure, you may not have a normal bowel movement for a few days.  Please Note:  You might notice some irritation and congestion in your nose or some drainage.  This is from the oxygen used during your procedure.  There is no need for concern and it should clear up in a day or so.  SYMPTOMS TO REPORT IMMEDIATELY:  Following lower endoscopy (colonoscopy or flexible sigmoidoscopy):  Excessive amounts of blood in the stool  Significant tenderness or worsening of abdominal pains  Swelling of the abdomen that is new, acute  Fever of 100F or higher  Following upper endoscopy (EGD)  Vomiting of blood or coffee ground material  New chest pain or pain under the shoulder blades  Painful or persistently difficult swallowing  New shortness of breath  Fever of 100F or higher  Black, tarry-looking stools  For urgent or emergent issues, a  gastroenterologist can be reached at any hour by calling [phone]. Do not use MyChart messaging for urgent concerns.    DIET:  We do recommend a small meal at first, but then you may proceed to your regular diet.  Drink plenty of fluids but you should avoid alcoholic beverages for 24 hours.  ACTIVITY:  You should plan to take it easy for the rest of today and you should NOT DRIVE or use heavy machinery until tomorrow (because of the sedation medicines used during the test).    FOLLOW UP: Our staff will call the number listed on your records the next business day following your procedure.  We will call around 7:15- 8:00 am to check on you and address any questions or concerns that you may have regarding the information given to you following your procedure. If we do not reach you, we will leave a message.     If any biopsies were taken you will be contacted by phone or by letter within the next 1-3 weeks.  Please call us at [phone] if you have not heard about the biopsies in 3 weeks.    SIGNATURES/CONFIDENTIALITY: You and/or your care partner have signed paperwork which will be entered into your electronic medical record.  These signatures attest to the fact that that the information above on your After Visit Summary has been reviewed and is understood.  Full responsibility of the confidentiality of this discharge information lies with you and/or your care-partner.

## 2023-03-07 NOTE — Progress Notes (Signed)
Report to PACU, RN, vss, BBS= Clear.

## 2023-03-07 NOTE — Op Note (Signed)
[facility] Patient Name: Jordan Waters Procedure Date: 03/07/2023 1:04 PM MRN: [id number] Endoscopist: Lynann Bologna , MD, [id number] Age: 48 Referring MD:  Date of Birth: April 03, 1975 Gender: Female Account #: [id number] Procedure:                Upper GI endoscopy Indications:              Epigastric abdominal pain with N/V, neg [place] Medicines:                Monitored Anesthesia Care Procedure:                Pre-Anesthesia Assessment:                           - Prior to the procedure, a History and Physical                            was performed, and patient medications and                            allergies were reviewed. The patient's tolerance of                            previous anesthesia was also reviewed. The risks                            and benefits of the procedure and the sedation                            options and risks were discussed with the patient.                            All questions were answered, and informed consent                            was obtained. Prior Anticoagulants: The patient has                            taken no anticoagulant or antiplatelet agents. ASA                            Grade Assessment: II - A patient with mild systemic                            disease. After reviewing the risks and benefits,                            the patient was deemed in satisfactory condition to                            undergo the procedure.                           After obtaining informed consent, the endoscope was  passed under direct vision. Throughout the                            procedure, the patient's blood pressure, pulse, and                            oxygen saturations were monitored continuously. The                            Olympus Scope SN [id number] was introduced through the                            mouth, and advanced to the second part of duodenum.                            The  upper GI endoscopy was accomplished without                            difficulty. The patient tolerated the procedure                            well. Scope In: Scope Out: Findings:                 The examined esophagus was normal.                           The Z-line was regular and was found 36 cm from the                            incisors.                           The entire examined stomach was normal. Biopsies                            were taken with a cold forceps for histology.                           The examined duodenum was normal. Biopsies for                            histology were taken with a cold forceps for                            evaluation of celiac disease. Complications:            No immediate complications. Estimated Blood Loss:     Estimated blood loss: none. Impression:               - Normal EGD Recommendation:           - Patient has a contact number available for                            emergencies. The signs and symptoms of potential  delayed complications were discussed with the                            patient. Return to normal activities tomorrow.                            Written discharge instructions were provided to the                            patient.                           - Resume previous diet.                           - Continue present medications.                           - Await pathology results.                           - The findings and recommendations were discussed                            with the patient's family. Lynann Bologna, MD 03/07/2023 1:18:57 PM This report has been signed electronically.

## 2023-03-10 ENCOUNTER — Telehealth: Payer: Self-pay | Admitting: *Deleted

## 2023-03-10 NOTE — Telephone Encounter (Signed)
  Follow up Call-     03/07/2023   12:42 PM  Call back number  Post procedure Call Back phone  # [phone]  Permission to leave phone message Yes     Patient questions:  Do you have a fever, pain , or abdominal swelling? No. Pain Score  0 *  Have you tolerated food without any problems? Yes.    Have you been able to return to your normal activities? Yes.    Do you have any questions about your discharge instructions: Diet   No. Medications  No. Follow up visit  No.  Do you have questions or concerns about your Care? No.  Actions: * If pain score is 4 or above: No action needed, pain <4.

## 2023-03-12 LAB — SURGICAL PATHOLOGY

## 2023-03-13 ENCOUNTER — Encounter: Payer: Self-pay | Admitting: Gastroenterology

## 2023-03-19 ENCOUNTER — Telehealth: Payer: Self-pay | Admitting: Radiation Oncology

## 2023-03-19 NOTE — Telephone Encounter (Signed)
 Returned patient's call per after-hours call log regarding appointment cancellation request. Patient stated she has come in to see Dr. Roselind Messier for the past 4 years and spoke to him about "tearing pain" in her abdomen. She expressed frustration at the lack of testing/imaging recommended in response to her pain and requested to cancel her upcoming appointment to "not waste money." She stated that if her cancer does re-occur, she does not want Dr. Roselind Messier as her provider and wanted me to notate as such. Appointment cancelled and message sent to team.

## 2023-03-20 ENCOUNTER — Telehealth: Payer: Self-pay | Admitting: Oncology

## 2023-03-20 ENCOUNTER — Ambulatory Visit: Payer: Self-pay | Admitting: Radiation Oncology

## 2023-03-20 NOTE — Telephone Encounter (Signed)
 Left a message regarding appointment with Dr. Pricilla Holm and possible CT abdomen/pelvis.  Requested a return call.

## 2023-03-21 NOTE — Telephone Encounter (Signed)
 Called Jordan Waters and scheduled an appointment with Dr. Pricilla Holm for 04/17/23 at 3:15.  Asked if she would like to proceed with a CT scan of her abdomen/pelvis and she would like to wait to talk with Dr. Pricilla Holm.

## 2023-03-24 ENCOUNTER — Encounter: Payer: Self-pay | Admitting: Gynecologic Oncology

## 2023-03-27 ENCOUNTER — Ambulatory Visit: Payer: Self-pay | Admitting: Nurse Practitioner

## 2023-03-27 ENCOUNTER — Encounter: Payer: Self-pay | Admitting: Nurse Practitioner

## 2023-03-27 VITALS — BP 94/63 | HR 72 | Temp 97.5°F | Wt 113.4 lb

## 2023-03-27 DIAGNOSIS — R748 Abnormal levels of other serum enzymes: Secondary | ICD-10-CM | POA: Diagnosis not present

## 2023-03-27 NOTE — Patient Instructions (Signed)
 1. Elevated liver enzymes (Primary)  - CBC - Comprehensive metabolic panel   Follow up:  Follow up in 6 months

## 2023-03-27 NOTE — Progress Notes (Signed)
 Subjective   Patient ID: Jordan Waters, female    DOB: 12-May-1975, 48 y.o.   MRN: [id number]  Chief Complaint  Patient presents with   Medical Management of Chronic Issues    Follow up for referrals     Referring provider: No ref. provider found  Jordan Waters is a 48 y.o. female with Past Medical History: No date: Anemia No date: Chemotherapy-induced nausea and vomiting     Comment:  intermittant No date: Depression with anxiety No date: History of cancer chemotherapy     Comment:  cervical cancer--- 08-17-2018 to 09-07-2018 No date: History of external beam radiation therapy     Comment:  cervical cancer--- 07-31-2018 to 09-08-2018 09/15/2018: History of radiation therapy     Comment:  vaginal Tandem and Ring HDR 09/15/2018 Dr Antony Blackbird No date: Intermittent diarrhea     Comment:  secondary to radiation therapy oncologist-- dr gorsuch/  dr Roselind Messier: Malignant neoplasm of  overlapping sites of cervix [facility])     Comment:  dx 07-28-2018--- Stage IB3--  completed chemotherapy               09-07-2018 and IMRT 09-08-2018 No date: Migraine No date: Neuropathic pain No date: Pancytopenia, acquired (HCC) 07/28/2018: Pelvic mass in female   HPI  Patient presents today for follow-up visit.  She continues to follow with OB/GYN and oncology for history of cervical cancer.  She does have ongoing chronic pain to left upper quadrant abdomen.  She has been worked up for this and so far all tests are negative.  We will recheck labs today. Denies f/c/s, n/v/d, hemoptysis, PND, leg swelling Denies chest pain or edema      No Known Allergies   There is no immunization history on file for this patient.  Tobacco History: Social History   Tobacco Use  Smoking Status Former   Current packs/day: 0.00   Types: Cigarettes   Start date: 02/15/2010   Quit date: 02/15/2018   Years since quitting: 5.1  Smokeless Tobacco Never   Counseling given: Not Answered   Outpatient  Encounter Medications as of 03/27/2023  Medication Sig   Misc Natural Products (T-RELIEF CBD+13) SUBL Place under the tongue.   Multiple Vitamin (MULTIVITAMIN WITH MINERALS) TABS tablet Take 1 tablet by mouth daily.   OVER THE COUNTER MEDICATION Algae version of omega daily   Probiotic Product (PROBIOTIC PO) Take by mouth.   No facility-administered encounter medications on file as of 03/27/2023.    Review of Systems  Review of Systems  Constitutional: Negative.   HENT: Negative.    Cardiovascular: Negative.   Gastrointestinal: Negative.   Allergic/Immunologic: Negative.   Neurological: Negative.   Psychiatric/Behavioral: Negative.       Objective:   BP 94/63   Pulse 72   Temp (!) 97.5 F (36.4 C) (Oral)   Wt 113 lb 6.4 oz (51.4 kg)   SpO2 100%   BMI 21.43 kg/m   Wt Readings from Last 5 Encounters:  03/27/23 113 lb 6.4 oz (51.4 kg)  03/07/23 105 lb (47.6 kg)  12/13/22 105 lb (47.6 kg)  09/27/22 113 lb (51.3 kg)  09/20/22 112 lb 9.6 oz (51.1 kg)     Physical Exam Vitals and nursing note reviewed.  Constitutional:      General: She is not in acute distress.    Appearance: She is well-developed.  Cardiovascular:     Rate and Rhythm: Normal rate and regular rhythm.  Pulmonary:     Effort: Pulmonary  effort is normal.     Breath sounds: Normal breath sounds.  Neurological:     Mental Status: She is alert and oriented to person, place, and time.       Assessment & Plan:   Elevated liver enzymes -     CBC -     Comprehensive metabolic panel -     Lipid panel     Return in about 6 months (around 09/27/2023).     Ivonne Andrew, NP 04/02/2023

## 2023-03-28 ENCOUNTER — Telehealth: Payer: Self-pay

## 2023-03-28 LAB — COMPREHENSIVE METABOLIC PANEL
ALT: 9 IU/L (ref 0–32)
AST: 14 IU/L (ref 0–40)
Albumin: 4.2 g/dL (ref 3.9–4.9)
Alkaline Phosphatase: 70 IU/L (ref 44–121)
BUN/Creatinine Ratio: 14 (ref 9–23)
BUN: 9 mg/dL (ref 6–24)
Bilirubin Total: <0.2 mg/dL (ref 0.0–1.2)
CO2: 24 mmol/L (ref 20–29)
Calcium: 9.3 mg/dL (ref 8.7–10.2)
Chloride: 106 mmol/L (ref 96–106)
Creatinine, Ser: 0.63 mg/dL (ref 0.57–1.00)
Globulin, Total: 2.2 g/dL (ref 1.5–4.5)
Glucose: 80 mg/dL (ref 70–99)
Potassium: 4.5 mmol/L (ref 3.5–5.2)
Sodium: 142 mmol/L (ref 134–144)
Total Protein: 6.4 g/dL (ref 6.0–8.5)
eGFR: 109 mL/min/{1.73_m2} (ref >59)

## 2023-03-28 LAB — CBC
Hematocrit: 41 % (ref 34.0–46.6)
Hemoglobin: 13.2 g/dL (ref 11.1–15.9)
MCH: 31.1 pg (ref 26.6–33.0)
MCHC: 32.2 g/dL (ref 31.5–35.7)
MCV: 97 fL (ref 79–97)
Platelets: 473 10*3/uL — ABNORMAL HIGH (ref 150–450)
RBC: 4.25 x10E6/uL (ref 3.77–5.28)
RDW: 12.9 % (ref 11.7–15.4)
WBC: 6.6 10*3/uL (ref 3.4–10.8)

## 2023-03-28 LAB — LIPID PANEL
Chol/HDL Ratio: 3.5 ratio (ref 0.0–4.4)
Cholesterol, Total: 190 mg/dL (ref 100–199)
HDL: 55 mg/dL (ref >39)
LDL Chol Calc (NIH): 114 mg/dL — ABNORMAL HIGH (ref 0–99)
Triglycerides: 119 mg/dL (ref 0–149)
VLDL Cholesterol Cal: 21 mg/dL (ref 5–40)

## 2023-04-01 NOTE — Telephone Encounter (Signed)
 Jordan Waters

## 2023-04-02 ENCOUNTER — Encounter: Payer: Self-pay | Admitting: Nurse Practitioner

## 2023-04-09 ENCOUNTER — Encounter: Payer: Self-pay | Admitting: Gynecologic Oncology

## 2023-04-17 ENCOUNTER — Inpatient Hospital Stay: Payer: 59 | Attending: Gynecologic Oncology | Admitting: Gynecologic Oncology

## 2023-04-17 ENCOUNTER — Encounter: Payer: Self-pay | Admitting: Gynecologic Oncology

## 2023-04-17 VITALS — BP 101/58 | HR 77 | Temp 99.1°F | Resp 18 | Wt 112.2 lb

## 2023-04-17 DIAGNOSIS — Z90710 Acquired absence of both cervix and uterus: Secondary | ICD-10-CM | POA: Insufficient documentation

## 2023-04-17 DIAGNOSIS — Z90722 Acquired absence of ovaries, bilateral: Secondary | ICD-10-CM | POA: Insufficient documentation

## 2023-04-17 DIAGNOSIS — Z8541 Personal history of malignant neoplasm of cervix uteri: Secondary | ICD-10-CM | POA: Diagnosis not present

## 2023-04-17 DIAGNOSIS — Z9079 Acquired absence of other genital organ(s): Secondary | ICD-10-CM | POA: Diagnosis not present

## 2023-04-17 DIAGNOSIS — Z923 Personal history of irradiation: Secondary | ICD-10-CM | POA: Insufficient documentation

## 2023-04-17 DIAGNOSIS — Z08 Encounter for follow-up examination after completed treatment for malignant neoplasm: Secondary | ICD-10-CM | POA: Diagnosis not present

## 2023-04-17 DIAGNOSIS — Z9221 Personal history of antineoplastic chemotherapy: Secondary | ICD-10-CM | POA: Insufficient documentation

## 2023-04-17 DIAGNOSIS — C538 Malignant neoplasm of overlapping sites of cervix uteri: Secondary | ICD-10-CM

## 2023-04-17 NOTE — Progress Notes (Signed)
 Gynecologic Oncology Return Clinic Visit  04/17/23  Reason for Visit: surveillance   Treatment History: Oncology History  Malignant neoplasm of overlapping sites of cervix (HCC)  07/26/2018 Imaging   MR pelvis 1. Poorly marginated/infiltrative 4.7 x 3.4 x 3.0 cm mass of the left uterine cervix with invasion of the cervical stroma, worrisome for FIGO stage IB3 cervical carcinoma. No frank parametrial, bladder or vaginal involvement. GYN ONC consultation suggested. 2. No pelvic adenopathy. 3. No suspicious ovarian or adnexal findings.   07/28/2018 Initial Diagnosis   Malignant neoplasm of overlapping sites of cervix [facility])   07/28/2018 Pathology Results   1. Cervix, biopsy, ectocervix - ADENOCARCINOMA. - SEE MICROSCOPIC DESCRIPTION 2. Endocervix, curettage - ADENOCARCINOMA. - SEE MICROSCOPIC DESCRIPTION Microscopic Comment 1. -2. Both of the specimens show adenocarcinoma with similar features and the morphology favors endocervical primary. An endometrial primary cannot be completely excluded.   08/10/2018 Procedure   Status post right IJ port catheter placement. Catheter ready for use.   08/17/2018 - 09/07/2018 Chemotherapy   The patient had 4 cycles of cisplatin for chemotherapy treatment, complicated by severe nausea, vomiting and pancytopenia    10/20/2018 Imaging   Significant decrease in size of cervical mass since previous study. No evidence of parametrial invasion or other pelvic metastatic disease.   Tiny less than 1 cm intramural fibroids in the right posterior corpus and fundus.   11/30/2018 Procedure   Removal of implanted Port-A-Cath utilizing sharp and blunt dissection. The procedure was uncomplicated.      The patient reports the onset of heavy vaginal bleeding since Fathers' Day 2020. This resulted in syncopal feelings on 07/24/18 which led to her visiting the [facility] ER the same day. A TVUS was performed which showed a possible endometrial mass.   An Hb was drawn which  showed severe acute blood loss anemia of 7.7mg /dL.   She was admitted, received 2 units PRBC (with an appropriate corresponding rise in Hb to 9.7) and then received an MRI on 07/26/18. This showed the mildly enlarged anteverted uterus measures 10.7 x 5.4 x 5.5 cm. There is a 4.7 x 3.4 x 3.0 cm poorly marginated infiltrative mass centered in the anterior left uterine cervix which extends 12:00 to 4:00 in the lower cervix, and extends 12:00 to 8:00 in the upper cervix, demonstrating intermediate T2 signal intensity and enhancement. This mass demonstrates cervical stromal invasion of at least 6 mm with no frank parametrial invasion. Mass appears to invade the lower most portion of the uterine cavity. No evidence of vaginal or bladder invasion. Findings are worrisome for a FIGO stage IB3 cervical carcinoma.   She had never been vaccinated against HPV.    Biopsy of the cervix on 07/28/18 showed adenocarcinoma of the cervix.   PET showed no apparent involvement outside of the cervix and uterus.    She commenced primary chemoradiation on 08/04/18 (though chemotherapy was delayed due to lack of port access).    She completed radiation to the pelvis and cervix with Dr. Antony Blackbird with radiosensitizing chemotherapy completed on September 15, 2018.  She received a single HDR tandem and ring brachytherapy treatment with anticipation for completion hysterectomy given her excellent response to therapy and stage I bulky disease.  The goal of this treatment approach was to minimize long-term toxicity of brachytherapy by debulking the primary site.   MRI was performed following completion of therapy on October 20, 2018 and revealed a uterus measuring 7.1 x 6 4 x 4.7 cm with a small mass in  the cervix so showing peripheral contrast-enhancement and a central area of necrosis that is significantly decreased in size.  It currently measures 2.1 x 1.7 cm.  The right and left ovaries were normal.  There is no peritoneal  thickening or free fluid or lymphadenopathy appreciated.   On October 29, 2018 she underwent robotic assisted total hysterectomy (extrafascial) with BSO.  Intraoperative findings revealed no gross residual cervical tumor and no suspicious lymph nodes.   Final pathology revealed focal residual adenocarcinoma, post neoadjuvant therapy.  Margins were negative.  Is a grade 2 moderately differentiated tumor.  The origin of the tumor was endocervical.   Post treatment imaging showed some thickening at the vaginal cuff.   Repeat MRI at [place] on 06/14/19 showed resolution of this asymmetric focus of enhancement within the left vaginal cuff.    Pap on 11/23/19 of the vaginal cuff showed atrophic vaginitis, no dysplasia or malignancy, negative high risk HPV.  08/2021: PAP - NIML, HR HPV +   08/2021: A. VAGINAL CUFF, BIOPSY:  - Low-grade squamous intraepithelial lesion (VaIN1, low grade dysplasia)   08/2022: NIML pap, HR HPV negative  Interval History: Doing well.  Continues to have pain at the site of her left upper quadrant incision.  Denies any pelvic pain.  Reports baseline bowel bladder function.  Working on no gluten and no dairy diet.  Denies any vaginal bleeding.  Past Medical/Surgical History: Past Medical History:  Diagnosis Date   Anemia    Chemotherapy-induced nausea and vomiting    intermittant   Depression with anxiety    History of cancer chemotherapy    cervical cancer--- 08-17-2018 to 09-07-2018   History of external beam radiation therapy    cervical cancer--- 07-31-2018 to 09-08-2018   History of radiation therapy 09/15/2018   vaginal Tandem and Ring HDR 09/15/2018 Dr Antony Blackbird   Intermittent diarrhea    secondary to radiation therapy   Malignant neoplasm of overlapping sites of cervix [facility]) oncologist-- dr gorsuch/  dr Roselind Messier   dx 07-28-2018--- Stage IB3--  completed chemotherapy 09-07-2018 and IMRT 09-08-2018   Migraine    Neuropathic pain    Pancytopenia, acquired  (HCC)    Pelvic mass in female 07/28/2018    Past Surgical History:  Procedure Laterality Date   colonoscopy  03/07/2023   IR IMAGING GUIDED PORT INSERTION  08/11/2018   IR REMOVAL TUN ACCESS W/ PORT W/O FL MOD SED  11/30/2018   OPERATIVE ULTRASOUND N/A 09/15/2018   Procedure: OPERATIVE ULTRASOUND;  Surgeon: Antony Blackbird, MD;  Location: [facility];  Service: Urology;  Laterality: N/A;   ROBOTIC ASSISTED TOTAL HYSTERECTOMY WITH BILATERAL SALPINGO OOPHERECTOMY Bilateral 10/29/2018   Procedure: XI ROBOTIC ASSISTED TOTAL HYSTERECTOMY WITH BILATERAL SALPINGO OOPHORECTOMY;  Surgeon: Adolphus Birchwood, MD;  Location: [facility];  Service: Gynecology;  Laterality: Bilateral;   TANDEM RING INSERTION N/A 09/15/2018   Procedure: TANDEM RING INSERTION;  Surgeon: Antony Blackbird, MD;  Location: [facility];  Service: Urology;  Laterality: N/A;   UPPER GI ENDOSCOPY  03/07/2023   WISDOM TOOTH EXTRACTION      Family History  Problem Relation Age of Onset   Multiple sclerosis Mother    Heart disease Father    Multiple sclerosis Brother    Heart disease Paternal Grandfather    Heart disease Paternal Uncle    Colon cancer Neg Hx    Breast cancer Neg Hx    Ovarian cancer Neg Hx    Endometrial cancer Neg Hx  Pancreatic cancer Neg Hx    Prostate cancer Neg Hx    Esophageal cancer Neg Hx     Social History   Socioeconomic History   Marital status: Married    Spouse name: Jerolyn Shin   Number of children: 3   Years of education: 12   Highest education level: Not on file  Occupational History   Not on file  Tobacco Use   Smoking status: Former    Current packs/day: 0.00    Types: Cigarettes    Start date: 02/15/2010    Quit date: 02/15/2018    Years since quitting: 5.1   Smokeless tobacco: Never  Vaping Use   Vaping status: Never Used  Substance and Sexual Activity   Alcohol use: Not Currently   Drug use: Never   Sexual activity: Yes    Birth control/protection:  Other-see comments    Comment: husband ---- vasectomy ;  abstinence since 06/ 2020  Other Topics Concern   Not on file  Social History Narrative   Lives with husband, no children at home- total of 5 , 3 hers and 2 step kids   Caffeine ,limited   Social Drivers of Health   Financial Resource Strain: Not on file  Food Insecurity: No Food Insecurity (03/27/2023)   Hunger Vital Sign    Worried About Running Out of Food in the Last Year: Never true    Ran Out of Food in the Last Year: Never true  Transportation Needs: No Transportation Needs (03/27/2023)   PRAPARE - Administrator, Civil Service (Medical): No    Lack of Transportation (Non-Medical): No  Physical Activity: Not on file  Stress: Not on file  Social Connections: Not on file    Current Medications:  Current Outpatient Medications:    Misc Natural Products (T-RELIEF CBD+13) SUBL, Place under the tongue., Disp: , Rfl:    Multiple Vitamin (MULTIVITAMIN WITH MINERALS) TABS tablet, Take 1 tablet by mouth daily., Disp: , Rfl:    OVER THE COUNTER MEDICATION, Algae version of omega daily, Disp: , Rfl:    Probiotic Product (PROBIOTIC PO), Take by mouth., Disp: , Rfl:   Review of Systems: Denies appetite changes, fevers, chills, fatigue, unexplained weight changes. Denies hearing loss, neck lumps or masses, mouth sores, ringing in ears or voice changes. Denies cough or wheezing.  Denies shortness of breath. Denies chest pain or palpitations. Denies leg swelling. Denies abdominal distention, pain, blood in stools, constipation, diarrhea, nausea, vomiting, or early satiety. Denies pain with intercourse, dysuria, frequency, hematuria or incontinence. Denies hot flashes, pelvic pain, vaginal bleeding or vaginal discharge.   Denies joint pain, back pain or muscle pain/cramps. Denies itching, rash, or wounds. Denies dizziness, headaches, numbness or seizures. Denies swollen lymph nodes or glands, denies easy bruising or  bleeding. Denies anxiety, depression, confusion, or decreased concentration.  Physical Exam: BP (!) 101/58 (BP Location: Right Arm, Patient Position: Sitting) Comment: Notified RN  Pulse 77   Temp 99.1 F (37.3 C) (Oral)   Resp 18   Wt 112 lb 3.2 oz (50.9 kg)   SpO2 100%   BMI 21.20 kg/m  General: Alert, oriented, no acute distress. HEENT: Normocephalic, atraumatic, sclera anicteric. Chest: Clear to auscultation bilaterally.  No wheezes or rhonchi. Cardiovascular: Regular rate and rhythm, no murmurs. Abdomen: soft, nontender.  Normoactive bowel sounds.  No masses or hepatosplenomegaly appreciated.  Well-healed incisions, some scar tissue palpated under left upper quadrant incision. Extremities: Grossly normal range of motion.  Warm, well perfused.  No edema bilaterally. Skin: No rashes or lesions noted. Lymphatics: No cervical, supraclavicular, or inguinal adenopathy. GU: Normal appearing external genitalia without erythema, excoriation, or lesions.  Speculum exam reveals vaginal cuff is smooth, no lesions or atypical vascularity noted.  Bimanual exam reveals no masses or nodularity.  Rectovaginal exam confirms these findings.  Laboratory & Radiologic Studies: None new  Assessment & Plan: Jordan Waters is a 48 y.o. woman with a history of stage IB3 endocervical adenocarcinoma s/p primary chemoradiation completed 09/21/18, s/p completion extrafascial hysterectomy on 10/29/2018 with minimal (focal) residual carcinoma.   No evidence of recurrence on today's exam.  Overall doing well.   Due for pap next 08/2023.  Discussed possible referral to PT for upper abdominal pain that seems related to scar tissue from trocar incision in LUQ. May benefit from dry needling or trigger point injection. Would like to hold off referral at this time.   We will see her in 6 months.  If she remains NED at that time, she will be 5 years out from completion of adjuvant therapy.  She will be discharged to  yearly follow-up with her OB/GYN vs. Melissa.  We discussed signs and symptoms that should prompt a phone call before her next visit.  20 minutes of total time was spent for this patient encounter, including preparation, face-to-face counseling with the patient and coordination of care, and documentation of the encounter.  Eugene Garnet, MD  Division of Gynecologic Oncology  Department of Obstetrics and Gynecology  [facility]

## 2023-04-17 NOTE — Patient Instructions (Signed)
 It was good to see you today.  I do not see or feel any evidence of cancer recurrence on your exam.  I will see you for follow-up in 6 months.  As always, if you develop any new and concerning symptoms before your next visit, please call to see me sooner.

## 2023-10-02 ENCOUNTER — Encounter: Payer: Self-pay | Admitting: Nurse Practitioner

## 2023-10-02 ENCOUNTER — Ambulatory Visit (INDEPENDENT_AMBULATORY_CARE_PROVIDER_SITE_OTHER): Payer: Self-pay | Admitting: Nurse Practitioner

## 2023-10-02 VITALS — BP 109/50 | HR 71 | Temp 97.8°F | Wt 111.8 lb

## 2023-10-02 DIAGNOSIS — Z0000 Encounter for general adult medical examination without abnormal findings: Secondary | ICD-10-CM | POA: Diagnosis not present

## 2023-10-02 DIAGNOSIS — Z13220 Encounter for screening for lipoid disorders: Secondary | ICD-10-CM | POA: Diagnosis not present

## 2023-10-02 NOTE — Progress Notes (Signed)
 Subjective   Patient ID: Jordan Waters, female    DOB: [birth year], 48 y.o.   MRN: [id number]  Chief Complaint  Patient presents with   Medical Management of Chronic Issues    Referring provider: Oley Bascom RAMAN, NP  Jordan Waters is a 48 y.o. female with Past Medical History: No date: Anemia No date: Blood transfusion without reported diagnosis No date: Chemotherapy-induced nausea and vomiting     Comment:  intermittant No date: Clotting disorder (HCC) No date: Depression with anxiety No date: History of cancer chemotherapy     Comment:  cervical cancer--- 08-17-2018 to 09-07-2018 No date: History of external beam radiation therapy     Comment:  cervical cancer--- 07-31-2018 to 09-08-2018 09/15/2018: History of radiation therapy     Comment:  vaginal Tandem and Ring HDR 09/15/2018 Dr Lynwood Nasuti No date: Intermittent diarrhea     Comment:  secondary to radiation therapy oncologist-- dr gorsuch/  dr nasuti: Malignant neoplasm of  overlapping sites of cervix [facility])     Comment:  dx 07-28-2018--- Stage IB3--  completed chemotherapy               09-07-2018 and IMRT 09-08-2018 No date: Migraine No date: Neuropathic pain No date: Pancytopenia, acquired (HCC) 07/28/2018: Pelvic mass in female    HPI  Patient presents today for follow-up visit.  She states that overall things have been going well.  She has been following a gluten-free diet which has helped with her abdominal pain. Denies f/c/s, n/v/d, hemoptysis, PND, leg swelling Denies chest pain or edema     No Known Allergies   There is no immunization history on file for this patient.  Tobacco History: Social History   Tobacco Use  Smoking Status Former   Current packs/day: 0.00   Types: Cigarettes   Start date: 02/15/2010   Quit date: 02/15/2018   Years since quitting: 5.6  Smokeless Tobacco Never   Counseling given: Not Answered   Outpatient Encounter Medications as of 10/02/2023  Medication Sig    Misc Natural Products (T-RELIEF CBD+13) SUBL Place under the tongue.   Multiple Vitamin (MULTIVITAMIN WITH MINERALS) TABS tablet Take 1 tablet by mouth daily.   OVER THE COUNTER MEDICATION Algae version of omega daily   Probiotic Product (PROBIOTIC PO) Take by mouth.   No facility-administered encounter medications on file as of 10/02/2023.    Review of Systems  Review of Systems  Constitutional: Negative.   HENT: Negative.    Cardiovascular: Negative.   Gastrointestinal: Negative.   Allergic/Immunologic: Negative.   Neurological: Negative.   Psychiatric/Behavioral: Negative.       Objective:   BP (!) 109/50   Pulse 71   Temp 97.8 F (36.6 C) (Oral)   Wt 111 lb 12.8 oz (50.7 kg)   SpO2 100%   BMI 21.12 kg/m   Wt Readings from Last 5 Encounters:  10/02/23 111 lb 12.8 oz (50.7 kg)  04/17/23 112 lb 3.2 oz (50.9 kg)  03/27/23 113 lb 6.4 oz (51.4 kg)  03/07/23 105 lb (47.6 kg)  12/13/22 105 lb (47.6 kg)     Physical Exam Vitals and nursing note reviewed.  Constitutional:      General: She is not in acute distress.    Appearance: She is well-developed.  Cardiovascular:     Rate and Rhythm: Normal rate and regular rhythm.  Pulmonary:     Effort: Pulmonary effort is normal.     Breath sounds: Normal breath sounds.  Neurological:  Mental Status: She is alert and oriented to person, place, and time.       Assessment & Plan:    Lipid screening -     Lipid panel  Routine adult health maintenance -     CBC -     Comprehensive metabolic panel with GFR     Return in about 6 months (around 03/31/2024).     Bascom GORMAN Borer, NP 10/02/2023

## 2023-10-03 ENCOUNTER — Ambulatory Visit: Payer: Self-pay | Admitting: Nurse Practitioner

## 2023-10-03 LAB — CBC
Hematocrit: 45.1 % (ref 34.0–46.6)
Hemoglobin: 14.8 g/dL (ref 11.1–15.9)
MCH: 32.5 pg (ref 26.6–33.0)
MCHC: 32.8 g/dL (ref 31.5–35.7)
MCV: 99 fL — ABNORMAL HIGH (ref 79–97)
Platelets: 285 x10E3/uL (ref 150–450)
RBC: 4.56 x10E6/uL (ref 3.77–5.28)
RDW: 12.4 % (ref 11.7–15.4)
WBC: 5.6 x10E3/uL (ref 3.4–10.8)

## 2023-10-03 LAB — COMPREHENSIVE METABOLIC PANEL WITH GFR
ALT: 9 IU/L (ref 0–32)
AST: 12 IU/L (ref 0–40)
Albumin: 4.7 g/dL (ref 3.9–4.9)
Alkaline Phosphatase: 69 IU/L (ref 44–121)
BUN/Creatinine Ratio: 13 (ref 9–23)
BUN: 10 mg/dL (ref 6–24)
Bilirubin Total: 0.3 mg/dL (ref 0.0–1.2)
CO2: 24 mmol/L (ref 20–29)
Calcium: 9.9 mg/dL (ref 8.7–10.2)
Chloride: 104 mmol/L (ref 96–106)
Creatinine, Ser: 0.77 mg/dL (ref 0.57–1.00)
Globulin, Total: 2.2 g/dL (ref 1.5–4.5)
Glucose: 86 mg/dL (ref 70–99)
Potassium: 4.9 mmol/L (ref 3.5–5.2)
Sodium: 142 mmol/L (ref 134–144)
Total Protein: 6.9 g/dL (ref 6.0–8.5)
eGFR: 95 mL/min/1.73 (ref >59)

## 2023-10-03 LAB — LIPID PANEL
Chol/HDL Ratio: 3 ratio (ref 0.0–4.4)
Cholesterol, Total: 226 mg/dL — ABNORMAL HIGH (ref 100–199)
HDL: 75 mg/dL (ref >39)
LDL Chol Calc (NIH): 134 mg/dL — ABNORMAL HIGH (ref 0–99)
Triglycerides: 95 mg/dL (ref 0–149)
VLDL Cholesterol Cal: 17 mg/dL (ref 5–40)

## 2023-10-08 ENCOUNTER — Telehealth: Payer: Self-pay

## 2023-10-08 NOTE — Telephone Encounter (Signed)
 She was due for cotesting in [id number]. Please remind her that she will need this when she establishes with someone (and if it seems like its going to be a long time, we could do this for her and help facilitate someone else in [place])

## 2023-10-08 NOTE — Telephone Encounter (Addendum)
 I spoke to Jordan Waters this morning regarding her upcoming appointment with Dr.Tucker on 9/19.   Pt reports she is transferring all of her doctors  out of the [facility] system. She would like Dr.Tucker to send a referral to [facility] in [place]. The earliest appointment they had was in February but told her if a referral could be placed, they could possibly get her in sooner.   She is aware if they need records from our office she would need to sign a medical release form.  She voiced an understanding and is aware message will be given to Dr.Tucker.   Pt asked for appointment on 9/19 be cancelled.

## 2023-10-08 NOTE — Telephone Encounter (Signed)
 Pt is aware of message below, via Mychart message

## 2023-10-08 NOTE — Telephone Encounter (Signed)
 She's 5 years out from completing her treatment. I think she could transition to yearly visits with an OBGYN. If she is not comfortable with that, you can place referral to [facility]. Thanks!

## 2023-10-09 ENCOUNTER — Telehealth: Payer: Self-pay

## 2023-10-09 NOTE — Telephone Encounter (Signed)
 Pt called office stating she can not get into her Mychart account. She is aware of needing a Pap w/HPV when she sees her new PCP/OBGYN.  She voiced an understanding.

## 2023-10-10 ENCOUNTER — Ambulatory Visit: Admitting: Gynecologic Oncology

## 2024-04-01 ENCOUNTER — Ambulatory Visit: Payer: Self-pay | Admitting: Nurse Practitioner
